# Patient Record
Sex: Male | Born: 1945 | Race: White | Hispanic: No | Marital: Married | Smoking: Former smoker
Health system: Southern US, Community
[De-identification: ages and names within clinical notes are randomized; demographics above are authoritative.]

## PROBLEM LIST (undated history)

## (undated) ENCOUNTER — Ambulatory Visit

## (undated) DIAGNOSIS — T4145XA Adverse effect of unspecified anesthetic, initial encounter: Secondary | ICD-10-CM

## (undated) DIAGNOSIS — K219 Gastro-esophageal reflux disease without esophagitis: Secondary | ICD-10-CM

## (undated) DIAGNOSIS — T8189XA Other complications of procedures, not elsewhere classified, initial encounter: Secondary | ICD-10-CM

## (undated) DIAGNOSIS — Z951 Presence of aortocoronary bypass graft: Secondary | ICD-10-CM

## (undated) DIAGNOSIS — K649 Unspecified hemorrhoids: Secondary | ICD-10-CM

## (undated) DIAGNOSIS — T8859XA Other complications of anesthesia, initial encounter: Secondary | ICD-10-CM

## (undated) DIAGNOSIS — M1990 Unspecified osteoarthritis, unspecified site: Secondary | ICD-10-CM

## (undated) DIAGNOSIS — I447 Left bundle-branch block, unspecified: Secondary | ICD-10-CM

## (undated) DIAGNOSIS — M549 Dorsalgia, unspecified: Secondary | ICD-10-CM

## (undated) DIAGNOSIS — I10 Essential (primary) hypertension: Secondary | ICD-10-CM

## (undated) DIAGNOSIS — R29898 Other symptoms and signs involving the musculoskeletal system: Secondary | ICD-10-CM

## (undated) DIAGNOSIS — I2510 Atherosclerotic heart disease of native coronary artery without angina pectoris: Secondary | ICD-10-CM

## (undated) DIAGNOSIS — R002 Palpitations: Secondary | ICD-10-CM

## (undated) DIAGNOSIS — B001 Herpesviral vesicular dermatitis: Secondary | ICD-10-CM

## (undated) DIAGNOSIS — K430 Incisional hernia with obstruction, without gangrene: Secondary | ICD-10-CM

## (undated) DIAGNOSIS — K439 Ventral hernia without obstruction or gangrene: Secondary | ICD-10-CM

## (undated) HISTORY — PX: EYE SURGERY: SHX253

## (undated) HISTORY — DX: Unspecified hemorrhoids: K64.9

## (undated) HISTORY — DX: Other complications of procedures, not elsewhere classified, initial encounter: T81.89XA

## (undated) HISTORY — PX: APPENDECTOMY: SHX54

## (undated) HISTORY — PX: COLONOSCOPY: SHX174

## (undated) HISTORY — DX: Atherosclerotic heart disease of native coronary artery without angina pectoris: I25.10

## (undated) HISTORY — PX: ELBOW SURGERY: SHX618

## (undated) HISTORY — PX: OTHER SURGICAL HISTORY: SHX169

## (undated) HISTORY — PX: HEMORROIDECTOMY: SUR656

## (undated) HISTORY — DX: Presence of aortocoronary bypass graft: Z95.1

---

## 2000-01-25 ENCOUNTER — Inpatient Hospital Stay (HOSPITAL_COMMUNITY): Admission: AD | Admit: 2000-01-25 | Discharge: 2000-01-30 | Payer: Self-pay | Admitting: Cardiovascular Disease

## 2000-01-25 ENCOUNTER — Encounter: Payer: Self-pay | Admitting: Thoracic Surgery (Cardiothoracic Vascular Surgery)

## 2000-01-26 ENCOUNTER — Encounter: Payer: Self-pay | Admitting: Thoracic Surgery (Cardiothoracic Vascular Surgery)

## 2000-01-26 DIAGNOSIS — Z951 Presence of aortocoronary bypass graft: Secondary | ICD-10-CM

## 2000-01-26 HISTORY — DX: Presence of aortocoronary bypass graft: Z95.1

## 2000-01-26 HISTORY — PX: CORONARY ARTERY BYPASS GRAFT: SHX141

## 2000-01-27 ENCOUNTER — Encounter: Payer: Self-pay | Admitting: Thoracic Surgery (Cardiothoracic Vascular Surgery)

## 2000-01-28 ENCOUNTER — Encounter: Payer: Self-pay | Admitting: Thoracic Surgery (Cardiothoracic Vascular Surgery)

## 2000-01-29 ENCOUNTER — Encounter: Payer: Self-pay | Admitting: Thoracic Surgery (Cardiothoracic Vascular Surgery)

## 2001-03-04 ENCOUNTER — Encounter: Payer: Self-pay | Admitting: *Deleted

## 2001-03-04 ENCOUNTER — Ambulatory Visit (HOSPITAL_COMMUNITY): Admission: RE | Admit: 2001-03-04 | Discharge: 2001-03-04 | Payer: Self-pay | Admitting: *Deleted

## 2001-06-01 ENCOUNTER — Encounter: Payer: Self-pay | Admitting: Cardiovascular Disease

## 2001-06-01 ENCOUNTER — Encounter: Payer: Self-pay | Admitting: Emergency Medicine

## 2001-06-01 ENCOUNTER — Inpatient Hospital Stay (HOSPITAL_COMMUNITY): Admission: EM | Admit: 2001-06-01 | Discharge: 2001-06-02 | Payer: Self-pay | Admitting: Emergency Medicine

## 2001-06-04 ENCOUNTER — Observation Stay (HOSPITAL_COMMUNITY): Admission: RE | Admit: 2001-06-04 | Discharge: 2001-06-05 | Payer: Self-pay | Admitting: General Surgery

## 2001-06-04 ENCOUNTER — Encounter: Payer: Self-pay | Admitting: Internal Medicine

## 2001-06-04 ENCOUNTER — Encounter: Payer: Self-pay | Admitting: General Surgery

## 2001-06-04 ENCOUNTER — Ambulatory Visit (HOSPITAL_COMMUNITY): Admission: RE | Admit: 2001-06-04 | Discharge: 2001-06-04 | Payer: Self-pay | Admitting: Internal Medicine

## 2002-01-07 ENCOUNTER — Encounter: Payer: Self-pay | Admitting: Internal Medicine

## 2002-01-07 ENCOUNTER — Ambulatory Visit (HOSPITAL_COMMUNITY): Admission: RE | Admit: 2002-01-07 | Discharge: 2002-01-07 | Payer: Self-pay | Admitting: Internal Medicine

## 2004-02-19 HISTORY — PX: CARDIAC CATHETERIZATION: SHX172

## 2004-03-10 ENCOUNTER — Observation Stay (HOSPITAL_COMMUNITY): Admission: EM | Admit: 2004-03-10 | Discharge: 2004-03-11 | Payer: Self-pay | Admitting: Emergency Medicine

## 2004-05-27 ENCOUNTER — Ambulatory Visit (HOSPITAL_COMMUNITY): Admission: RE | Admit: 2004-05-27 | Discharge: 2004-05-27 | Payer: Self-pay | Admitting: Family Medicine

## 2004-06-17 ENCOUNTER — Ambulatory Visit (HOSPITAL_COMMUNITY): Admission: RE | Admit: 2004-06-17 | Discharge: 2004-06-17 | Payer: Self-pay | Admitting: Otolaryngology

## 2004-10-06 ENCOUNTER — Ambulatory Visit (HOSPITAL_COMMUNITY): Admission: RE | Admit: 2004-10-06 | Discharge: 2004-10-06 | Payer: Self-pay | Admitting: Family Medicine

## 2005-05-11 ENCOUNTER — Emergency Department (HOSPITAL_COMMUNITY): Admission: EM | Admit: 2005-05-11 | Discharge: 2005-05-11 | Payer: Self-pay | Admitting: Emergency Medicine

## 2005-06-27 ENCOUNTER — Ambulatory Visit (HOSPITAL_COMMUNITY): Admission: RE | Admit: 2005-06-27 | Discharge: 2005-06-27 | Payer: Self-pay | Admitting: Family Medicine

## 2009-12-29 ENCOUNTER — Ambulatory Visit (HOSPITAL_COMMUNITY): Admission: RE | Admit: 2009-12-29 | Discharge: 2009-12-29 | Payer: Self-pay | Admitting: Family Medicine

## 2009-12-29 IMAGING — CR DG CHEST 2V
2 series · 2 of 2 positions shown · non-contrast
Comparison: [DATE]

CLINICAL DATA: Cough, fever, chest pain, shortness of breath

CHEST - 2 VIEW

[view not recorded (1 of 2)]
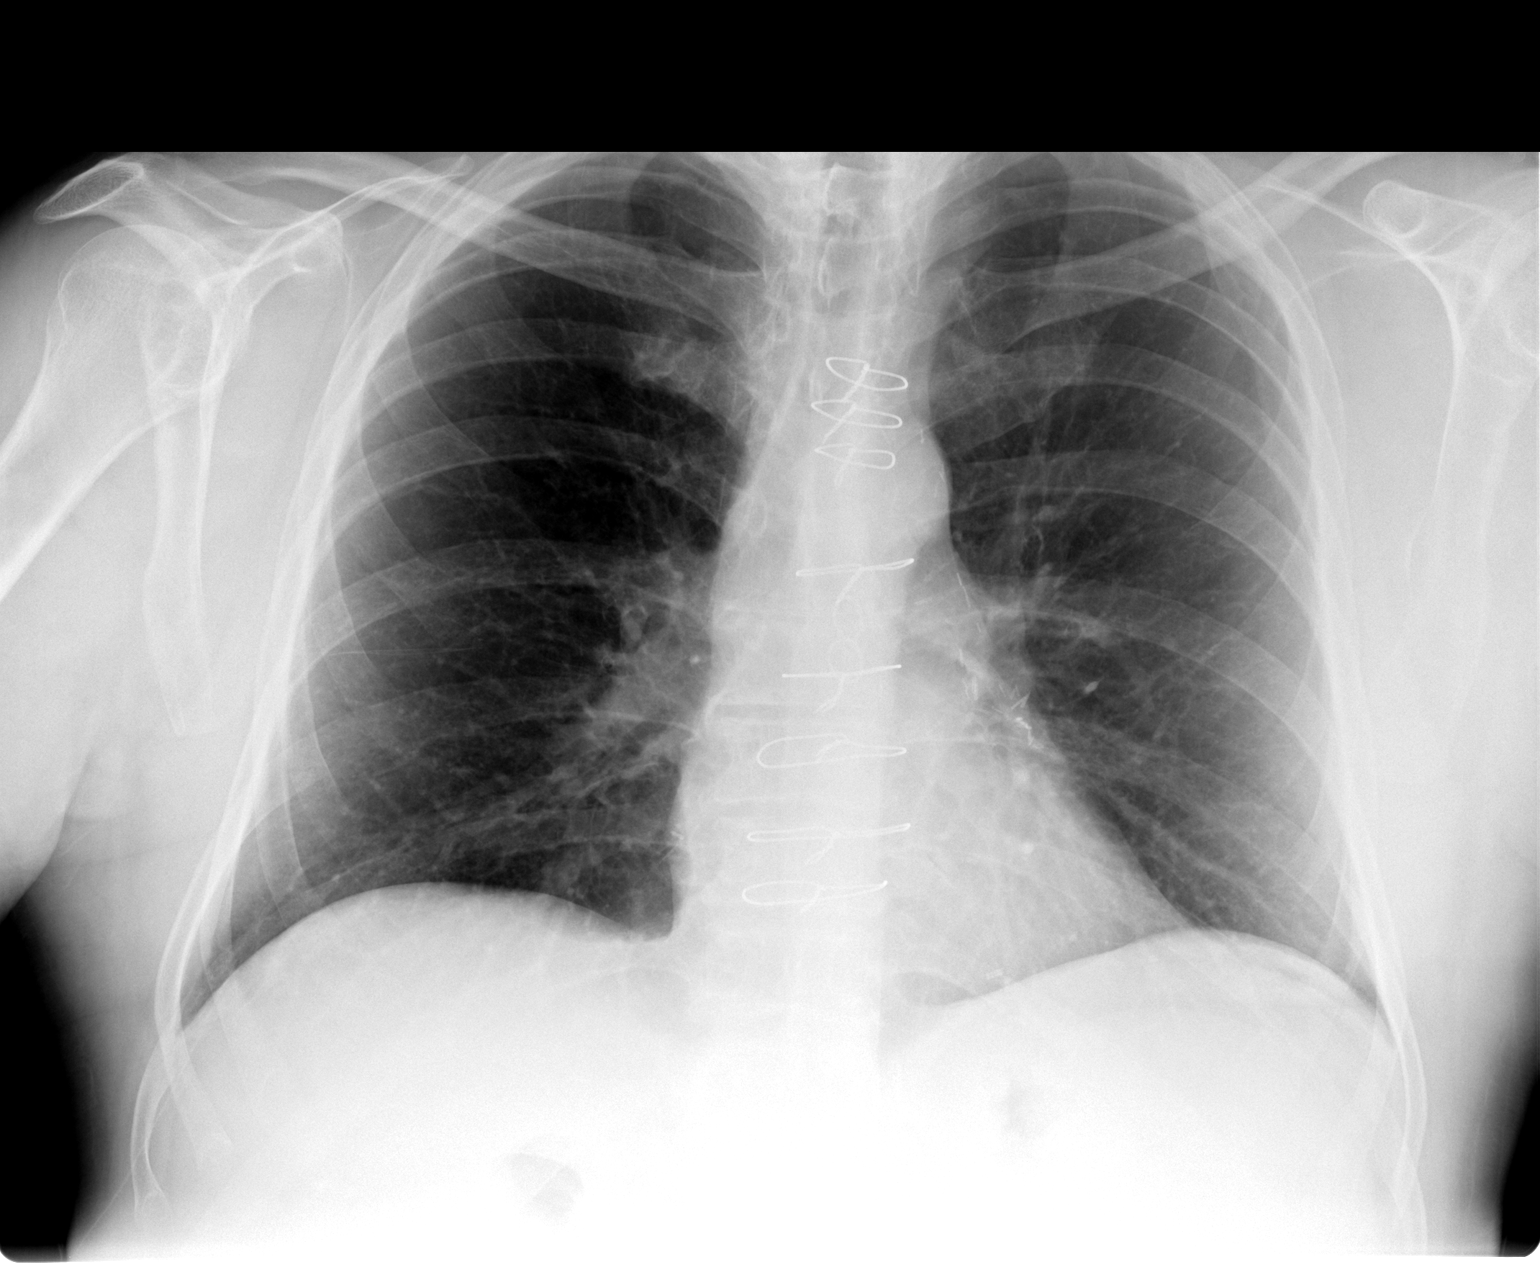

[view not recorded (2 of 2)]
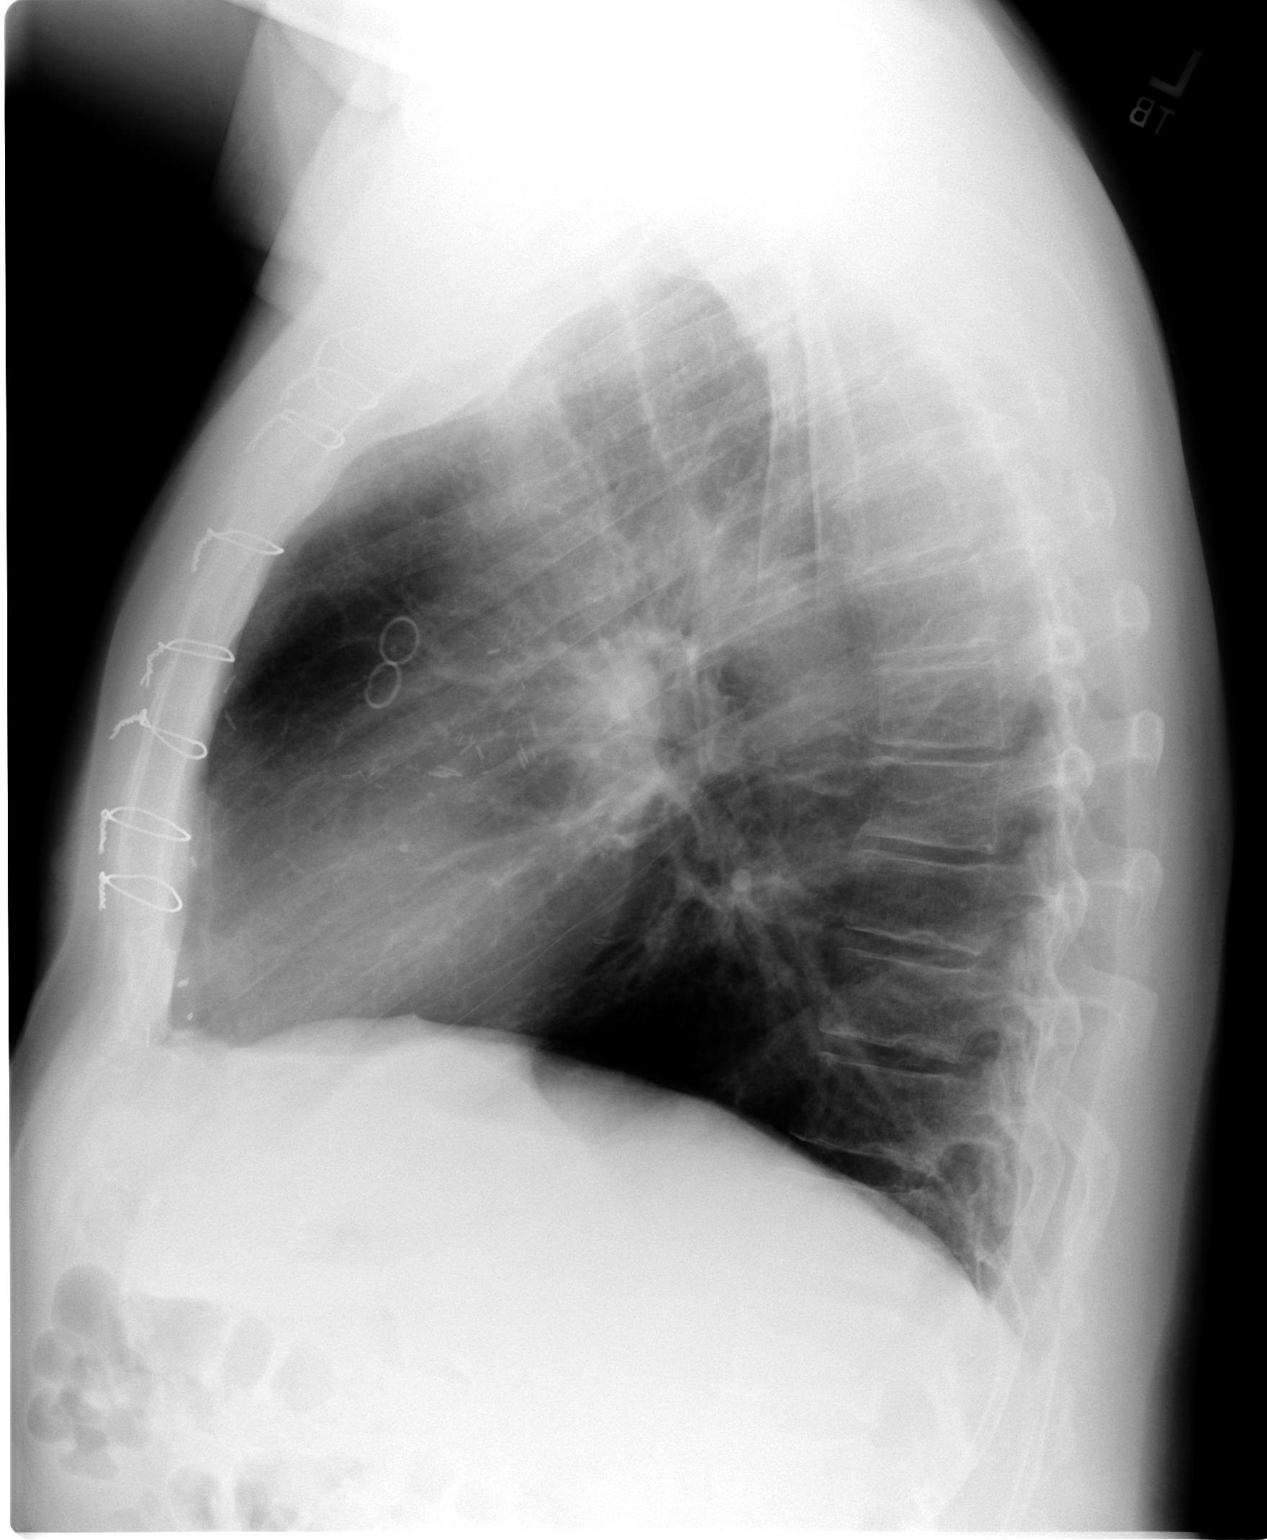

[2 of 2 positions shown; findings below may reference images not displayed]

FINDINGS: Normal heart size, mediastinal contours, and pulmonary vascularity.
Postsurgical changes of CABG.
Minimal chronic bronchitic changes without infiltrate or effusion.
No acute bony abnormality or pneumothorax.
IMPRESSION: Status post CABG.
Minimal chronic bronchitic changes without acute infiltrate.

## 2011-02-06 ENCOUNTER — Other Ambulatory Visit: Payer: Self-pay | Admitting: General Surgery

## 2011-02-06 ENCOUNTER — Encounter (HOSPITAL_COMMUNITY): Payer: 59

## 2011-02-06 LAB — SURGICAL PCR SCREEN: MRSA, PCR: NEGATIVE

## 2011-02-06 LAB — CBC
HCT: 42.1 % (ref 39.0–52.0)
Hemoglobin: 15.1 g/dL (ref 13.0–17.0)
MCH: 31.5 pg (ref 26.0–34.0)
MCV: 87.9 fL (ref 78.0–100.0)
RBC: 4.79 MIL/uL (ref 4.22–5.81)
RDW: 12.7 % (ref 11.5–15.5)
WBC: 9.8 10*3/uL (ref 4.0–10.5)

## 2011-02-06 LAB — BASIC METABOLIC PANEL
BUN: 13 mg/dL (ref 6–23)
CO2: 30 mEq/L (ref 19–32)
Creatinine, Ser: 0.79 mg/dL (ref 0.4–1.5)
GFR calc non Af Amer: >60 mL/min (ref >60)

## 2011-02-10 ENCOUNTER — Other Ambulatory Visit: Payer: Self-pay | Admitting: General Surgery

## 2011-02-10 ENCOUNTER — Observation Stay (HOSPITAL_COMMUNITY)
Admission: RE | Admit: 2011-02-10 | Discharge: 2011-02-11 | Disposition: A | Discharge status: Home / Self Care | Payer: 59 | Source: Ambulatory Visit | Attending: General Surgery | Admitting: General Surgery

## 2011-02-10 DIAGNOSIS — Z01812 Encounter for preprocedural laboratory examination: Secondary | ICD-10-CM | POA: Insufficient documentation

## 2011-02-10 DIAGNOSIS — K4090 Unilateral inguinal hernia, without obstruction or gangrene, not specified as recurrent: Principal | ICD-10-CM | POA: Insufficient documentation

## 2011-02-10 DIAGNOSIS — K644 Residual hemorrhoidal skin tags: Secondary | ICD-10-CM | POA: Insufficient documentation

## 2011-02-13 NOTE — Discharge Summary (Signed)
  Tyler Lawrence, Tyler Lawrence               ACCOUNT NO.:  [id number]  MEDICAL RECORD NO.:  [id number]           PATIENT TYPE:  O  LOCATION:  A317                          FACILITY:  [facility]  PHYSICIAN:  Barbaraann Barthel, M.D. DATE OF BIRTH:  09/19/1946  DATE OF ADMISSION:  02/10/2011 DATE OF DISCHARGE:  03/24/2012LH                              DISCHARGE SUMMARY   DIAGNOSES: 1. Right inguinal hernia. 2. Large hemorrhoidal tags  PROCEDURE: 1. On February 10, 2011, via the outpatient department, right inguinal     herniorrhaphy and (modified McVay repair without mesh). 2. Excision of large hemorrhoidal tags x2.  SECONDARY DIAGNOSIS:  Coronary artery disease, history of coronary artery bypass in 2001.  Note, this is a 65 year old white male who presented with large hemorrhoidal tags.  There were sequelae to hemorrhoidectomy in 1994.  I also found that he had a right inguinal hernia and we planned to take care of both of these problems during the same admission.  Due to his significant cardiac history, he had a cardiac clearance with [facility] preoperatively and we planned to see him via the outpatient department and planned for surgery with a period of observation afterwards.  This was taken care of on February 10, 2011, at which time the herniorrhaphy was carried out without problem and I excised the 2 large hemorrhoidal tags and he was discharged on the following day.  At the time of discharge, he was voiding well without dysuria.  His wounds were clean and they were redressed, and he had no shortness of breath or leg pain or chest pain.  DISCHARGE INSTRUCTIONS:  He is discharged on a regular diet.  He is told to increase his activity as tolerated.  He is told to shower.  He is to do no lifting greater than 5 pounds.  No driving and no sexual activity until instructed.  He is told to do sitz baths after each BM and apply Vaseline gauze to the perianal area.  He is also told  given a prescription for Colace 100 mg p.o. daily or every other day as needed for his bowels and he is also given a prescription for Toradol 10 mg p.o. q.4-6 h as needed for pain.  We will follow up with him on Friday on February 17, 2011, at 10:00 a.m.  He is told to contact us should there be any acute problems or go to the emergency room should there be any problems.     Barbaraann Barthel, M.D.     WB/MEDQ  D:  02/11/2011  T:  02/11/2011  Job:  [id number]  cc:   Madelin Rear. Sherwood Gambler, MD Fax: [phone]  Electronically Signed by Barbaraann Barthel M.D. on 02/13/2011 11:59:28 AM

## 2011-02-13 NOTE — Op Note (Signed)
Tyler Lawrence, Tyler Lawrence               ACCOUNT NO.:  [id number]  MEDICAL RECORD NO.:  [id number]           PATIENT TYPE:  O  LOCATION:  A317                          FACILITY:  [facility]  PHYSICIAN:  Barbaraann Barthel, M.D. DATE OF BIRTH:  Oct 15, 1946  DATE OF PROCEDURE:  02/10/2011 DATE OF DISCHARGE:                              OPERATIVE REPORT   SURGEON:  Barbaraann Barthel, MD.  PREOPERATIVE DIAGNOSES: 1. Right inguinal hernia. 2. Large external hemorrhoidal tags.  PROCEDURE: 1. Right inguinal herniorrhaphy (modified McVay repair without mesh). 2. Excision of large external hemorrhoidal tags x2.  WOUND CLASSIFICATIONS:  For hernia, clean.  For hemorrhoidal tags, clean contaminated.  SPECIMEN:  Hemorrhoidal tags x2.  Note, this is a 65 year old white male who presented with large external hemorrhoidal tags and difficulty cleaning himself.  He had undergone a hemorrhoidectomy in 1994 and these tags were causing him considerableconsternation.  We discussed excising these with him, and on physical examination I noted that he also had a right inguinal hernia.  We decided to take care of this as an outpatient and discussed complications not limited to but including bleeding and infection and as far as the hernia goes for possible recurrence.  Informed consent was obtained.  GROSS OPERATIVE FINDINGS:  The patient had a direct inguinal hernia noted.  No indirect component was noted there.  Regarding the hemorrhoids, the patient had large hemorrhoidal tags x2.  These were located at approximately the 5 o'clock position and the 11 o'clock position with the 12 o'clock position being anterior.  TECHNIQUE:  The patient was placed in the supine position after attempted spinal anesthesia.  This was converted to endotracheal intubation.  There was some difficulty by the nursing staff, putting in the Foley.  I asked Dr. Jerre Simon and he confirmed that the Foley was in the bladder and there was no  problem.  We then prepped the abdomen and draped in usual manner.  An incision was carried out between the anterior-superior iliac spine and the pubic tubercle through skin, subcutaneous tissue, and Scarpa's layer down to the external oblique, which was opened through the external ring.  The ilioinguinal nerve was preserved from the area of dissection and we then checked for an indirect component.  There was no indirect hernia sac.  The directed defect was obviously visible.  This was about the size of a thumb.  We then sutured transversus abdominis and transversalis fascia to Cooper's ligament and Poupart's ligament with interrupted 2-0 Bralon sutures.  A relaxing incision was carried out before cinching these tightly.  We used 0.5% Sensorcaine around this wound here  for postoperative comfort.  I then returned the cord structures to their anatomic position and repaired the external oblique with a running 3-0 Polysorb over it.  The subcu was irrigated and the skin was approximated with stapling device.  Prior to closure, all sponge, needle, and instrument counts were found to be correct.  There were no complications.  I then dressed the wound with 4x4s and an OpSite dressing and attention was then turned to the hemorrhoidal tags.  The legs were then elevated and he was  in a lithotomy position.  We then prepped with Betadine solution and draped in usual manner and I used the LigaSure and removed the hemorrhoidal tags from the 5 o'clock and the 11 o'clock position uneventfully.  I used approximately 15 mL of 0.5% Sensorcaine to help with postoperative comfort perianally and a viscous dressing was applied.  Prior to closure, all sponge, needle, and instrument counts were found be correct.  Estimated blood loss was minimal.  The patient received 1800 mL crystalloids intraoperatively. The patient was taken to recovery room in satisfactory condition. Because of his cardiac history and the  problems with administering a spinal anesthesia, we wanted to keep him under observation.  He was taken to recovery room in satisfactory condition.     Barbaraann Barthel, M.D.     WB/MEDQ  D:  02/10/2011  T:  02/10/2011  Job:  [id number]  cc:   Madelin Rear. Sherwood Gambler, MD Fax: [phone]  Electronically Signed by Barbaraann Barthel M.D. on 02/13/2011 11:58:26 AM

## 2011-04-07 NOTE — Cardiovascular Report (Signed)
NAME:  Tyler Lawrence, Tyler Lawrence                         ACCOUNT NO.:  [id number]   MEDICAL RECORD NO.:  [id number]                   PATIENT TYPE:  INP   LOCATION:  3709                                 FACILITY:  [facility]   PHYSICIAN:  Thereasa Solo. Little, M.D.              DATE OF BIRTH:  1946/06/03   DATE OF PROCEDURE:  03/11/2004  DATE OF DISCHARGE:                              CARDIAC CATHETERIZATION   PROCEDURES PERFORMED:   CARDIOLOGIST:  Gaspar Garbe B. Little, M.D.   INDICATIONS FOR TEST:  This 65 year old male had bypass surgery in 2001.  He  had an episode chest discomfort yesterday and was transferred to [facility] for further evaluation.  His cardiac enzymes were negative and the  was brought to the cath lab for repeat cardiac catheterization with graft  visualization.   DESCRIPTION OF PROCEDURE:  The patient was prepped and draped in the usual  sterile fashion exposing the right groin.  Following local anesthetic with  1% Xylocaine the Seldinger technique was employed and a 5 [place] introducer  sheath was placed into the right femoral artery.  Left and right coronary  arteriography, graft visualization and ventriculography in the RAO  projection were performed.   COMPLICATIONS:  None.   CONTRAST MATERIAL:  Total contrast; 115 mL.   EQUIPMENT:  Five French Judkins Configuration Catheters.  The saphenous vein  graft tot the diagonal had to be cannulated with a left coronary bypass  catheter, but the left internal mammary artery was well visualized with an  RCA catheter.   RESULTS:   HEMODYNAMIC MONITORING:  Central aortic pressure was 112/68.  The left  ventricular pressure was 112/5.  There was no valve gradient noted at the  time of pullback.   VENTRICULOGRAPHIC DATA:  Ventriculography in the RAO projection using 25 mL  of contrast at 12 cc/second revealed normal systolic function with no wall  motion abnormalities.  There was a PVC during the middle of the  ventriculogram  and there was ectopy-induced mitral regurgitation, but there  was no mitral regurgitation in the beats that were not associated with the  PVC.  Ejection fraction was greater than 55%.  The end-diastolic pressure  was 6.   CORONARY ARTERIOGRAPHIC DATA:  Coronary arteriography showed calcification  in the proximal portion of the LAD with single fluoroscopy.   ANGIOGRAPHIC DATA:  1. Left Main:  The left main bifurcated and was normal.   1. LAD:  The LAD was totally occluded in its proximal portion after a small     septal perforator.  The proximal portion prior to the total occlusion was     diffusely diseased.  There was no evidence of any collateral flow to the     distal LAD through any of the vessels.   1. Circumflex:  The circumflex was a large dominant vessel.  It gave rise to     a PDA and three OM  vessels.  There appears to be a very proximal OM     vessel that is faintly visualized, but is filled by the saphenous vein     graft.   1. Right Coronary Artery:  The right coronary artery is a nondominant vessel     that has a hyperlucent proximal area of 70% narrowing and this is similar     to the diffuse disease that was noted in 2001.   GRAFTS:  1. Saphenous Vein Graft to the OM:  The graft was widely patent.  There was     minimal narrowing at the distal anastomotic site and probably represents     a discrepancy between the size of the graft and the size of the vessel,     but there was no reduction in flow and there was no significant     narrowing.  The OM itself was well-visualized and there was some reflux     of contrast medium into the proximal circumflex.   1. Left Internal Mammary Artery to the LAD:  The left internal mammary     artery is widely patent.  It inserts into the mid/distal portion of the     LAD.  The LAD itself crosses the apex of the heart and is widely patent.     There is some reflux of the contrast antegrade into the first diagonal.   1. Saphenous  Vein Graft to the Diagonal:  This graft is smaller in diameter     than the other saphenous vein graft and there are some minimal     irregularities. The diagonal that it supplies is also extremely small,     but is free of disease.   CONCLUSION:  1. Normal left ventricular systolic function.  2. Widely patent grafts to the left anterior descending, first diagonal and     obtuse marginal vessel.  There is a nondominant right coronary artery     that has significant disease in it, but this vessel is too small and does     not warrant any type of interventions because it is nondominant.   At this point I do not think the patient needs any additional intervention  and needs to continue medical therapy.  The should be ready for discharge  later today.                                               Thereasa Solo. Little, M.D.    ABL/MEDQ  D:  03/11/2004  T:  03/12/2004  Job:  [id number]   cc:   Nicki Guadalajara, M.D.  [address], [address]  [place], [place] [address]  Fax: [phone]   Madelin Rear. Sherwood Gambler, M.D.  [address]  [place]  [place] [address]  Fax: [phone]   Catheterization Laboratory

## 2011-04-07 NOTE — Discharge Summary (Signed)
Tyler Lawrence. [facility]  Patient:    Lawrence, Tyler                      MRN: [id number] Adm. Date:  [id number] Disc. Date: [id number] Attending:  Tressie Stalker Dictator:   Lissa Merlin, P.A.-C CC:         [name]. Cornelius Moras, M.D.             [name], M.D.             Artis Delay, M.D.                           Discharge Summary  DATE OF BIRTH:  October 12, 1946  ADMISSION DIAGNOSES:  1. Unstable angina (new onset).  2. Abnormal stress Cardiolite.  PAST MEDICAL HISTORY:  1. Abnormal stress Cardiolite on March 2, 01.  2. Increasing dyspnea on exertion.  3. Palpitations.  4. Positive family history of CAD.  5. Heavy tobacco abuse, 3 packs per day since high school.  6. No previous documented history of CAD.  DISCHARGE DIAGNOSES:  1. Severe 2-vessel coronary artery disease as per catheterization.  2. Status post coronary artery bypass grafting.  PROCEDURES:  1. Cardiac catheterization on January 25, 2000.  Indicated severe 2-vessel CAD     including a critical 95% stenosis of the left anterior descending coronary     artery located at the takeoff of the first diagonal branch.  This was felt ot     to be amenable to percutaneous-based intervention.  There was a 50-60% proximal     stenosis at the first circumflex marginal branch and a small nondominant right     coronary artery.  There was normal left ventricular function.  2. Coronary artery bypass grafting x 3 on January 26, 2000 with the following grafts:     LIMA to LAD, saphenous vein graft to first diagonal, and saphenous vein graft     to the first circumflex marginal branch.  BRIEF HISTORY:  The patient is a 65 year old, who has been followed by Dr. Artis Delay and referred by Dr. Daphene Jaeger for management of coronary artery disease. He has a history of hypertension and previous history of tobacco abuse.  He presented with new onset angina and underwent stress Cardiolite examination by Dr.  Tresa Endo,  which showed classical ischemic changes, both with symptoms, EKG changes as well. The patient was admitted for elective cardiac catheterization by Dr. Tresa Endo. The findings indicated above.  CVTS was consulted.  Dr. Cornelius Moras evaluated Mr. Crossan and the available data.  He concluded CABG was the best treatment for him.  He discussed the indications and potential benefits of coronary artery bypass grafting.  He discussed the risks.  The patient and his family were agreeable to proceed with surgery.  The patient underwent the operation on January 26, 2000 without complications.  He was in normal sinus rhythm.  There were no inotropic agents, nor blood products given. He was transported to SICU in stable condition.  He was extubated later that evening.  He was awake and hemodynamically stable.  He had good saturation. His lab work was satisfactory.  His lungs were clear.  He was noted to be doing very well.  On postop day #1 he was afebrile, in normal sinus rhythm.  He was hemodynamically stable.  O2 saturations were good.  His abdomen was soft and nontender, nondistended.  His  urinary output was good.  Hemoglobin was 11.8, platelets were 169.  Chest x-ray was satisfactory.  Cardiogram showed normal sinus rhythm.  He was noted to be doing very well and was deemed suitable to transfer to unit 2000. e was being followed by cardiology, who also noted his good progress.  He was kept in SICU secondary to no bed available on 2000 on January 27, 2000.  He continued to do well.  Chest tubes were discontinued without incident.  He was eventually transferred to unit 2000 when a bed became available.  On January 30, 2000, postop day #4, he was awake, alert, and pleasant.  He had no  complaints except mild soreness around his incisions.  He had had a bowel movement. His vital signs were stable.  He was afebrile.  He was 92% on room air.  He was  back to his preoperative weight.  Physical  exam revealed clear lungs, benign abdomen, regular rate and rhythm, S1 and S2.  His incisions were healing very well. There was no erythema noted.  He was deemed suitable to discharge home and was subsequently done so that day.  MEDICATIONS:  1. Lopressor 50 mg 1/2 tablet q.12h.  2. Enteric-coated aspirin 325 mg 1 p.o. q.d.  3. Tylox 1-2 p.o. q.4-6h. p.r.n. pain.  SPECIAL INSTRUCTIONS:  The patient was instructed to avoid strenuous activity. No heavy lifting over 10 pounds.  He was told to avoid driving, and to walk daily. He was told that he can shower.  He was told to use his incentive spirometer daily. He was told to maintain a low fat, low cholesterol, Heart Smart diet.  He was told to get a chest x-ray when he sees Dr. Tresa Endo in followup, and to bring this with him to see Dr. Cornelius Moras.  FOLLOWUP:  1. The patient is to see Dr. Tresa Endo, March 22, at 1:30 p.m.  2. The patient was told that the CVTS office would call him with the day and time     of his appointment with Dr. Cornelius Moras. DD:  02/22/00 TD:  02/25/00 Job: [id number]

## 2011-04-07 NOTE — Discharge Summary (Signed)
NAME:  Tyler Lawrence, Tyler Lawrence                         ACCOUNT NO.:  [id number]   MEDICAL RECORD NO.:  [id number]                   PATIENT TYPE:  INP   LOCATION:  3709                                 FACILITY:  [facility]   PHYSICIAN:  Nicki Guadalajara, M.D.                  DATE OF BIRTH:  [birth year]   DATE OF ADMISSION:  03/10/2004  DATE OF DISCHARGE:  03/11/2004                                 DISCHARGE SUMMARY   DISCHARGE DIAGNOSES:  1. Chest pain consistent with unstable angina, patent grafts at     catheterization this admission.  2. History of coronary artery bypass grafting in the past by Dr. Cornelius Moras with     an left internal mammary artery to left anterior descending, saphenous     vein graft to the circumflex, and saphenous vein graft to the diagonal.  3. Treated hyperlipidemia.  4. History of palpitations.  5. Treated hypertension.   HOSPITAL COURSE:  The patient is a 65 year old male followed by Dr. Tresa Endo  who has had bypass surgery in 2001.  He had a Cardiolite study last April  that showed no ischemia with good LV function.  He presented March 10, 2004  with chest tenderness and some palpitations.  He was admitted to telemetry  and started on IV heparin.  Diagnostic catheterization was done March 11, 2004 by Dr. Clarene Duke.  This revealed a patent SVG to the OM1, patent LIMA to  the LAD, patent SVG to the diagonal with normal LV function.  We feel he can  be discharged later today.  His medications were not changed.   DISCHARGE MEDICATIONS:  1. Niaspan 1 g h.s.  2. Toprol XL 50 mg a day.  3. Zocor 20 mg a day.  4. Coated aspirin once a day.  5. Altace 5 mg a day.  6. Nexium 40 mg a day.   LABORATORY DATA:  White count 6.2, hemoglobin 13.3, hematocrit 38.9,  platelets 192.  Sodium 139, potassium 3.7, BUN 12, creatinine 0.9.  LFT's  are normal.  INR is 0.9.  CK, MB, and troponins were negative x2.  TSH is  0.715.  Urinalysis is unremarkable.   EKG shows sinus rhythm without acute  changes.   DISPOSITION:  The patient is discharged in stable condition.  He will follow  up with Dr. Tresa Endo on May 10 at 10:30.      Abelino Derrick, P.A.                      Nicki Guadalajara, M.D.    Lenard Lance  D:  03/11/2004  T:  03/12/2004  Job:  [id number]   cc:   Madelin Rear. Sherwood Gambler, M.D.  [address]  [place]  [place] [address]  Fax: [phone]

## 2011-04-07 NOTE — Cardiovascular Report (Signed)
[facility]  Patient:    Tyler Lawrence, Tyler Lawrence                      MRN: [id number] Proc. Date: 01/25/00 Adm. Date:  [id number] Attending:  Tressie Stalker CC:         [name], M.D.             Orville Govern -- CVTS Office             Bevelyn Buckles, M.D.                        Cardiac Catheterization  INDICATIONS:  Mr. Aber is a 65 year old white male with a longstanding history of sensation of chest fluttering and palpitations.  He has a significant tobacco abuse history and at some point having smoked up to three packs per day remotely.  He  recently was seen for palpitations and chest pain.  A Cardiolite scan was abnormal, with exercise-induced ST segment depression associated with chest tightness inferolaterally, and cineangiographic evidence for ischemia involving the inferior wall from the distal and mid ventricle into the apex.  He had preserved LV function at 60%.  He also had ischemia inferoseptally.  He is now admitted for cardiac catheterization.  HEMODYNAMIC DATA: 1. Central aortic pressure:  104/57. 2. Left ventricular pressure:  104/13.  ANGIOGRAPHIC DATA: 1. LEFT MAIN CORONARY ARTERY:  Angiographically normal.  It bifurcated into the LAD    and left circumflex system. 2. LEFT ANTERIOR DESCENDING:  Gave rise to a very proximal eccentric 70-80%    stenosis, followed by diffuse narrowing of approximately 50% until this became    95% stenosed in the region of the first diagonal takeoff.  There was bifurcation    stenosis, with 99% stenosis involving the ostium of this first diagonal vessel.    The remainder of the LAD appeared free of significant disease. 3. LEFT CIRCUMFLEX:  A dominant vessel.  The large first obtuse marginal vessel    seemed to have a 40-50% right proximal stenosis.  The remainder of the    circumflex system was angiographically normal and gave rise to additional obtuse    marginal vessel, a PLA and  PDA system. 4. RIGHT CORONARY ARTERY:  Diffusely diseased, but was a nondominant vessel with    narrowing of 40-50% in the proximal to mid segment, and 30% in the mid distal    segment.  LEFT VENTRICULOGRAPHY:  Biplane left cineangiography revealed preserved global V contractility with subtle anterolateral hypocontractility.  The left subclavian and internal mammary artery were normal and suitable for CABG revascularization.  DISTAL AORTOGRAPHY:  Did not demonstrate any significant aortoiliac disease. There is no evidence of renal artery stenosis.  IMPRESSION: 1. Preserved global LV function with mild focal anterolateral hypocontractility. 2. Significant multivessel disease with diffuse proximal LAD stenoses, with    70-80% eccentric near-ostial stenosis, followed by diffuse disease of 50% and    95% bifurcation stenosis involving the takeoff of the first diagonal vessel nd    LAD.  There is 40-50% stenosis in the obtuse marginal branch of a dominant    left circumflex coronary artery.  Nondominant right coronary artery with diffuse    40-50% irregularity proximally, 30% irregularity distally.   PLAN:  Cineangiograms will be reviewed with colleagues.  The patient does have diffuse LAD disease extending almost to the ostium and involving the takeoff the of first diagonal vessel.  I suspect CABG revascularization surgery with sequential LIMA to diagonal of the LAD may provide the best long-term benefit.  Surgical consultation will be obtained. DD:  01/25/00 TD:  01/26/00 Job: [id number]

## 2011-04-07 NOTE — Discharge Summary (Signed)
[place]. [facility]  Patient:    Tyler Lawrence, Tyler Lawrence Visit Number: [id number] MRN: [id number]          Service Type: MED Location: 2000 2008 01 Attending Physician:  Berry, Jonathan [place] Dictated by:   Darcella Gasman. In[place], F.N.P.C. Adm. Date:  06/01/2001 Disc. Date: 06/02/2001   CC:         Artis Delay, M.D., [facility],  [place], [place]   Discharge Summary  DISCHARGE DIAGNOSES: 1. Chest pain, nonspecific, probable gastrointestinal.    a. Negative myocardial infarction. 2. Abdominal discomfort, resolved. 3. Near syncope, most likely vasovagal, resolved.  DISCHARGE CONDITION:  Improved.  PROCEDURES:  None.  DISCHARGE MEDICATIONS: 1. Toprol 75 mg daily. 2. Enteric-coated aspirin 325 mg daily. 3. Niaspan 1000 mg daily. 4. Zocor 20 mg one every evening. 5. Nitroglycerin 1/150 as needed for chest pain. 6. Dyazide as before.  DISCHARGE INSTRUCTIONS: 1. Activity as tolerated. 2. Low-fat, low-salt diet. 3. Call the office Monday for an appointment with [name], M.D., in    three weeks.  HISTORY OF PRESENT ILLNESS:  This 65 year old white male with a history of coronary artery disease and CABG x 3 in March of 2001 presented to the emergency room with complaints of shortness of breath and chest pressure on June 01, 2001.  He was seen by [name], M.D., in the office in March of 2002 and was stable at that time.  The patient had reported being in good condition.  He works a lot and was physically active.  He woke up the night prior to admission to go to the bathroom and during urination felt like he was going to pass out.  His heart rate increased and he became nauseated and diaphoretic and had chest discomfort with some pressure-type feeling.  He then developed diarrhea x 2 with abdominal cramping.  No nitroglycerin was taken.  Chest pain and shortness of breath returned and he decided to go to the emergency room.  PAST  MEDICAL HISTORY:  Coronary artery bypass grafting by Tyler Lawrence. Cornelius Moras, M.D., in March of 2001 with LIMA to the LAD, saphenous vein graft to the left circumflex, and saphenous vein graft to the diagonal.  He also has hyperlipidemia, tachypalpitations, hypertension, and tobacco use in the past.  FAMILY HISTORY:  See H&P.  SOCIAL HISTORY:  See H&P.  REVIEW OF SYSTEMS:  See H&P.  OUTPATIENT MEDICATIONS: 1. Toprol XL 75 mg daily. 2. Dyazide 37.5/25 mg daily. 3. Niaspan 1000 mg daily. 4. Zocor 20 mg at h.s. 5. Ecotrin 325 mg daily.  ALLERGIES:  TYLOX and E-MYCIN.  PHYSICAL EXAMINATION AT DISCHARGE:  Blood pressure 120/70, pulse 80, respirations 20, temperature 97.7 degrees, room air oxygen saturation 95%.  GENERAL APPEARANCE:  An alert and oriented in no acute distress.  HEART:  S1 and S2 regular rate and rhythm.  LUNGS:  Clear without rhonchi, rales, or wheezes.  ABDOMEN:  Soft and nontender.  Positive bowel sounds.  LABORATORY DATA:  The hemoglobin was 15.6, hematocrit 44.4, WBC 6.8, MCV 90, platelets 167, neutrophils 67, lymphs 27, monos 5, eos 2, and basos 0.  This remained stable.  Pro time 13.1, INR 1, PTT 26.  Sodium 140, potassium 4.0, chloride 106, CO2 29, glucose 153, BUN 10, creatinine 0.8, calcium 8.4.  This remained stable, except the glucose did come down to 120.  Cardiac enzymes: CK ranged 176, 136, and 128, MB 3.1, 1.9, and 1.3, and troponin 0.03 to 0.01.  The patient  had two chest x-rays with no active disease post coronary artery bypass grafting.  EKG on admission:  Sinus rhythm with voltage, borderline EKG, no significant change from previous tracing.  HOSPITAL COURSE:  Mr. Guidotti was admitted by Runell Gess, M.D., on June 01, 2001, for chest pain and near syncope.  Cardiac enzymes were done, which were all negative.  He was not placed on heparin or nitroglycerin secondary to felt to be GI initially.  Actually he was supposed to have gone to  observation not an admission.  The blood work was stable.  By the morning of June 02, 2001, the patient felt much better with no other complaints. Enzymes were all negative for myocardial infarction.  DISPOSITION:  He was discharged home by Runell Gess, M.D.  FOLLOW-UP:  He will follow up in the office with [name], M.D. Dictated by:   Darcella Gasman Ingold, F.N.P.C. Attending Physician:  Berry, Jonathan [place]azil[place] DD:  07/09/01 TD:  07/10/01 Job: [id number]

## 2011-04-07 NOTE — Discharge Summary (Signed)
[place]. [facility]  Patient:    Tyler Lawrence, Tyler Lawrence                      MRN: [id number] Adm. Date:  [id number] Attending:  Tressie Stalker Dictator:   Eugenia Pancoast, P.A. CC:         [name], M.D.             [name]. Cornelius Moras, M.D.                           Discharge Summary  DATE OF BIRTH:  09-Jul-1946  FINAL DIAGNOSES: 1. Coronary artery disease. 2. Hypertension.  PROCEDURES: 1. On January 25, 2000, left heart catheterization by Dr. [name]. 2. On January 26, 2000, coronary artery bypass grafting x 3 with the LIMA to the    LAD, saphenous vein graft to the circumflex, and a saphenous vein graft to    the diagonal artery.  SURGEON:  [name]. Cornelius Moras, M.D.  HISTORY:  This is a 65 year old gentleman with no prior history of coronary artery disease, but he does have a long-standing history of sensation of chest fluttering and palpitations.  The patient has a significant history for tobacco abuse, and a history at some point of having smoked up to three packs per day.  The patient recently was seen for palpitations and chest pain.  A Cardiolite scan was done, which was abnormal with exercised induced ST segment depression associated with chest tightness.  Because of this, he was scheduled to undergo a cardiac catheterization per Dr. [name].  He was referred to Dr. Tresa Endo by Dr. ______ .  HOSPITAL COURSE:  The patient was admitted to the [facility], and on January 25, 2000, the day that he was admitted, he underwent a cardiac catheterization which revealed significant two-vessel disease.  Because of this, he was referred to Dr. Cornelius Moras for surgical revascularization.  Dr. Cornelius Moras saw the patient and discussed the surgery.  The risks and benefits were explained.  The patient understood and agreed to surgery.  On January 26, 2000, the patient underwent coronary artery bypass surgery x 3 with the LIMA to the LAD, a saphenous vein  graft to the circumflex artery, and a saphenous vein graft to the diagonal artery.  The patient tolerated the procedure well.  No intraoperative complications occurred.  Postoperatively, the patient was extubated on the operative day.  The patient was doing quite well after that. He remained afebrile.  His vital signs were stable.  He was transferred to the step-down unit on the first postoperative day.  There, he continued to progress in a satisfactory manner.  His final lab work showed that his white blood cells were 10.9, hemoglobin was 11.1, hematocrit 32.1, and platelets were 164.  Sodium of 139, potassium of 3.8, chloride was 100, CO2 was 30, BUN 10, creatinine 0.9, glucose 117, and calcium was 8.2.  The patient was doing quite well.  No untoward events occurred during his stay.  He progressed in a very satisfactory manner.  He was subsequently prepared for discharge on January 29, 2000.  The patient did have a complaint of constipation, but he was given Dulcolax suppository with good results.  The patient was subsequently prepared for discharge as noted.  DISCHARGE MEDICATIONS AT THE TIME OF DISCHARGE: 1. Lopressor 50 mg 1/2 tablet q.12h. 2. Enteric-coated aspirin 325 mg q.d. 3.  Tylox 1-2 p.o. q.4-6h. p.r.n. pain.  DISCHARGE INSTRUCTIONS:  The patient will follow up with Dr. [name] in two weeks.  He will see Dr. [name]. Owen in three weeks.  DISPOSITION:  The patient was subsequently discharged to home in satisfactory and stable condition on January 30, 2000. DD:  01/29/00 TD:  01/29/00 Job: [id number]

## 2011-04-07 NOTE — Op Note (Signed)
[place]. [facility]  Patient:    Tyler Lawrence, Tyler Lawrence                      MRN: [id number] Proc. Date: 01/26/00 Adm. Date:  [id number] Attending:  Tressie Stalker CC:         [name], M.D.             Artis Delay, M.D.             CVTS office                           Operative Report  PREOPERATIVE DIAGNOSIS:  Severe 2-vessel coronary artery disease with new onset  angina and positive stress Cardiolite examination.  POSTOPERATIVE DIAGNOSIS:  Severe 2-vessel coronary artery disease with new onset angina and positive stress Cardiolite examination.  PROCEDURE:  Median sternotomy for coronary artery bypass grafting x 3 (left internal mammary artery to distal left anterior descending coronary artery, saphenous vein graft to first diagonal branch, saphenous vein graft to first circumflex marginal branch).  SURGEON: [name]. Cornelius Moras, M.D.  ASSISTANT:  Maple Mirza, P.A.  ANESTHESIA:  General.  BRIEF CLINICAL NOTE:  The patient is a 65 year old white male followed by Dr. Artis Delay and referred by Dr. Daphene Jaeger for management of coronary artery disease. The patient has history of hypertension and previous history of tobacco  abuse.  He  presents with new onset angina and underwent stress Cardiolite examination by Dr. Tresa Endo demonstrating classical ischemic changes, both with symptoms, EKG changes, as well as, radiographic changes on ______.  The patient subsequently underwent elective cardiac catheterization by Dr. Tresa Endo that demonstrates severe 2-vessel  coronary artery disease, including a critical 95% stenosis of the left anterior  descending coronary artery located at the takeoff of the first diagonal branch.  This was felt not to be amenable to percutaneous-based intervention.  There was a 50-60% proximal stenosis of the first circumflex marginal branch and a small nondominant right coronary artery.  There was normal left ventricular  function.   OPERATIVE CONSENT:  The patient and his family were counselled at length regarding the indications and potential benefits of coronary artery bypass grafting. They understand the associated risks of surgery, including, but not limited to risk f death, stroke, myocardial infarction, bleeding requiring blood transfusion, arrhythmia, infection, and recurrent coronary artery disease.  They accept these risks, as well as, any unforeseen complications and agreed to proceed with surgery as described.  OPERATIVE NOTE IN DETAIL:  The patient was brought to the operating room on the  above mentioned date and invasive hemodynamic monitoring was established by the  anesthesia service under the care and direction of Dr. Burna Forts.  The patient was placed in the supine position on the operating table.  Following induction with general endotracheal anesthesia, the patients chest, abdomen, both groins and both lower extremities were prepared and draped in a sterile manner.   A median sternotomy incision was performed and the left internal mammary artery was dissected from the chest wall and prepared for bypass grafting.  The left internal mammary artery was notably good quality conduit for bypass grafting. Simultaneously, saphenous vein was obtained from the patients right lower leg through a series of longitudinal incisions.  The saphenous vein was slightly small caliber, but otherwise very good quality conduit for bypass grafting.  The patient was heparinized systemically.  The pericardium was opened.  The  ascending aorta was inspected and was notably ree of any palpable plaques or calcifications.  The ascending aorta and the right atrial appendage were cannulated for cardiopulmonary bypass.  Adequate heparinization was verified.  Cardiopulmonary bypass was begun and the surface of the heart was inspected. Distal sites were selected for coronary artery bypass  grafting.  There was left  dominant coronary circulation.  The left ventricle appears normal.  Portions of  saphenous vein and the left internal mammary artery were trimmed to appropriate  lengths.  A temperature probe was placed in the left ventricular septum and a styrofoam pad was placed to protect the left phrenic nerve from thermal injury. A cardioplegia catheter was placed in the ascending aorta.  The patient was cooled to 32 degrees systemic temperature.  The aortic cross-clamp was applied and cardioplegia was delivered in an antegrade fashion through the aortic root.  Additional doses of cardioplegia were administered both through the aortic root and down subsequently placed vein grafts to maintain septal temperature below 15 degrees Centigrade throughout the cross-clamp portion of the operation. The following distal coronary anastomoses were performed:  1.  The first circumflex marginal branch was grafted with a saphenous vein graft in an end-to-side fashion using running 7-0 Prolene suture.  This coronary measured 1.7 mm in diameter and has a 50% proximal stenosis.  This coronary was of good quality at the site of distal bypass.  2.  The first diagonal branch off the left anterior descending coronary artery as grafted with a saphenous vein graft in an end-to-side fashion using running 7-0  Prolene suture.  This coronary measured 1.3 mm in diameter and has 99% proximal  stenosis.  This coronary was of good quality at the site of distal bypass.  3.  The distal left anterior descending coronary artery was grafted with the left internal mammary artery using running 8-0 Prolene suture.  This coronary measured 2 mm in diameter and was of good quality at the site of distal bypass.  There was 95% proximal stenosis.  Both proximal saphenous vein anastomoses were performed directly to the ascending aorta prior to removal of the aortic cross-clamp. The septal temperature  was noted to rise rapidly and dramatically upon reperfusion f the left internal mammary artery.  All air was removed from the aortic root and the  patient was placed in Trendelenburg position.  The aortic cross-clamp was removed after a total cross-clamp time of 47 minutes.  The heart was defibrillated into a normal sinus rhythm.  All proximal and distal anastomoses were inspected for hemostasis and appropriate graft orientation. Epicardial pacing wires were affixed to the right ventricular outflow tract and to the right atrial appendage.   The patient was rewarmed to greater than 37 degrees Centigrade temperature.  The patient was weaned from cardiopulmonary bypass without difficulty.  The patients rhythm at separation from bypass was normal sinus rhythm.  No inotropic support was required.  Total cardiopulmonary bypass time for the operation was 6 minutes.  The venous and arterial cannulae were removed uneventfully.  Protamine was administered to reverse the anticoagulation.  The mediastinum and the left chest were irrigated with saline solution containing vancomycin.  Meticulous surgical  hemostasis was ascertained.  The mediastinum and the left chest were drained with three chest tubes placed through separate stab incisions inferiorly.  The median sternotomy was closed in routine fashion.  The right lower extremity incisions were closed in multiple layers in routine fashion.  All skin incisions were closed with subcuticular  skin closures.  The patient tolerated the procedure well and was transported to the surgical intensive care unit in stable condition.  There were no intraoperative complications.  All sponge, instrument and needle counts were verified correct t completion of the operation.  No autologous blood products were administered. DD:  01/26/00 TD:  01/27/00 Job: [id number]

## 2011-09-19 ENCOUNTER — Telehealth (INDEPENDENT_AMBULATORY_CARE_PROVIDER_SITE_OTHER): Payer: Self-pay | Admitting: *Deleted

## 2011-09-19 ENCOUNTER — Other Ambulatory Visit (INDEPENDENT_AMBULATORY_CARE_PROVIDER_SITE_OTHER): Payer: Self-pay | Admitting: *Deleted

## 2011-09-19 ENCOUNTER — Encounter (INDEPENDENT_AMBULATORY_CARE_PROVIDER_SITE_OTHER): Payer: Self-pay | Admitting: Internal Medicine

## 2011-09-19 ENCOUNTER — Encounter (INDEPENDENT_AMBULATORY_CARE_PROVIDER_SITE_OTHER): Payer: Self-pay | Admitting: *Deleted

## 2011-09-19 ENCOUNTER — Ambulatory Visit (INDEPENDENT_AMBULATORY_CARE_PROVIDER_SITE_OTHER): Payer: 59 | Admitting: Internal Medicine

## 2011-09-19 VITALS — BP 112/62 | HR 72 | Temp 98.9°F | Ht 68.0 in | Wt 187.7 lb

## 2011-09-19 DIAGNOSIS — K625 Hemorrhage of anus and rectum: Secondary | ICD-10-CM

## 2011-09-19 MED ORDER — PEG-KCL-NACL-NASULF-NA ASC-C 100 G PO SOLR: 1.0000 | Freq: Once | ORAL | Status: DC

## 2011-09-19 NOTE — Progress Notes (Signed)
Subjective:     Patient ID: Tyler Lawrence, male   DOB: 11/09/1946, 66 y.o.   MRN: [id number]  HPIMr. Lawrence is a 65 yr old male presenting today as a referral from Dr. Malvin Lawrence for rectal bleeding.  Rectal bleeding since 17. He tells me he has hemorrhoids.  He see blood when he wipes 3-4 times. It depends if he rides a tractor or has been lifting.  Appetite is good. He does not want to eat meat. He says it smells nasty. No weight loss. He has a BM 3 times a day. No family hx of colon cancer. Hemorrhoidectomy this year by Dr. Malvin Lawrence.  His last colonoscopy was years ago,  He says it was normal. (Over 10 yrs) Review of Systems see hpi     Current Outpatient Prescriptions  Medication Sig Dispense Refill  . aspirin 81 MG tablet Take 81 mg by mouth daily.        . metoprolol (TOPROL-XL) 50 MG 24 hr tablet Take 50 mg by mouth daily.        . niacin (NIASPAN) 750 MG CR tablet Take 750 mg by mouth at bedtime.        Marland Kitchen omeprazole (PRILOSEC) 20 MG capsule Take 20 mg by mouth daily.        . ramipril (ALTACE) 5 MG tablet Take 5 mg by mouth daily.        . simvastatin (ZOCOR) 20 MG tablet Take 20 mg by mouth at bedtime.         Past Surgical History  Procedure Date  . Triple bypass 2002   . Rt hernia repair in march of 2012   . Appendectomy   . Elbow surgery     for a chipped bone   Past Medical History  Diagnosis Date  . CAD (coronary artery disease)   . Hemorrhoid    Family Status  Relation Status Death Age  . Mother Deceased     Natural causes  . Father Deceased     MI age 108  . Sister Alive     Alzheimer's (both)  . Brother Alive     One deceased child birth. One deceased with some type of muscle disease. One okay.   History   Social History Narrative  . No narrative on file   History   Social History  . Marital Status: Married    Spouse Name: N/A    Number of Children: N/A  . Years of Education: N/A   Occupational History  . Not on file.   Social History Main  Topics  . Smoking status: Never Smoker   . Smokeless tobacco: Not on file  . Alcohol Use: No  . Drug Use: No  . Sexually Active: Not on file   Other Topics Concern  . Not on file   Social History Narrative  . No narrative on file   No Known Allergies  Objective:   Physical Exam Filed Vitals:   09/19/11 1423  BP: 112/62  Pulse: 72  Temp: 98.9 F (37.2 C)  Height: 5\' 8"  (1.727 m)  Weight: 187 lb 11.2 oz (85.14 kg)    .Alert and oriented. Skin warm and dry. Oral mucosa is moist. Natural teeth in good condition. Sclera anicteric, conjunctivae is pink. Thyroid not enlarged. No cervical lymphadenopathy. Lungs clear. Heart regular rate and rhythm.  Abdomen is soft. Bowel sounds are positive. No hepatomegaly. No abdominal masses felt. No tenderness. Stool brown and guaiac negative. No edema to  lower extremities. Patient is alert and oriented.      Assessment:    Rectal bleeding. Colonic neoplasm, hemorrhoid needs to be ruled out.    Plan:    Colonoscopy.The risks and benefits such as perforation, bleeding, and infection were reviewed with the patient and is agreeable.

## 2011-09-19 NOTE — Telephone Encounter (Signed)
Patient need movi prep 

## 2011-09-25 NOTE — Patient Instructions (Signed)
Further recommendations once he has undergone the colonoscopy

## 2011-10-23 ENCOUNTER — Encounter (HOSPITAL_COMMUNITY): Payer: Self-pay | Admitting: Pharmacy Technician

## 2011-10-25 ENCOUNTER — Telehealth (INDEPENDENT_AMBULATORY_CARE_PROVIDER_SITE_OTHER): Payer: Self-pay | Admitting: *Deleted

## 2011-10-25 NOTE — Telephone Encounter (Signed)
LM - Would like to cx his procedure scheduled for tomorrow, 10/26/11.  Will CB to reschedule.

## 2011-10-25 NOTE — Telephone Encounter (Signed)
appt canceled

## 2011-10-26 ENCOUNTER — Encounter (HOSPITAL_COMMUNITY): Admission: RE | Payer: Self-pay | Source: Ambulatory Visit

## 2011-10-26 ENCOUNTER — Ambulatory Visit (HOSPITAL_COMMUNITY): Admission: RE | Admit: 2011-10-26 | Payer: 59 | Source: Ambulatory Visit | Admitting: Internal Medicine

## 2011-10-26 SURGERY — COLONOSCOPY
Anesthesia: Moderate Sedation

## 2012-04-23 ENCOUNTER — Encounter (INDEPENDENT_AMBULATORY_CARE_PROVIDER_SITE_OTHER): Payer: Self-pay | Admitting: *Deleted

## 2012-07-16 ENCOUNTER — Encounter (INDEPENDENT_AMBULATORY_CARE_PROVIDER_SITE_OTHER): Payer: Self-pay | Admitting: General Surgery

## 2012-07-16 ENCOUNTER — Ambulatory Visit (INDEPENDENT_AMBULATORY_CARE_PROVIDER_SITE_OTHER): Payer: 59 | Admitting: General Surgery

## 2012-07-16 VITALS — BP 132/70 | HR 84 | Temp 97.2°F | Resp 16 | Ht 68.0 in | Wt 184.6 lb

## 2012-07-16 DIAGNOSIS — K644 Residual hemorrhoidal skin tags: Secondary | ICD-10-CM

## 2012-07-16 DIAGNOSIS — K648 Other hemorrhoids: Secondary | ICD-10-CM

## 2012-07-16 NOTE — Progress Notes (Signed)
Patient ID: Tyler Lawrence, male   DOB: 06-27-1946, 66 y.o.   MRN: [id number]  Chief Complaint  Patient presents with  . Pre-op Exam    eval hems    HPI Tyler Lawrence is a 66 y.o. male.  He is referred, basically by Dr. Sherwood Gambler and Dr. Karilyn Cota, for evaluation of hemorrhoids.  The patient has a chronic history with hemorrhoids off and on. 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 He has intermittent bright red blood per rectum. He says he actually has not had any bleeding for about 3-4 weeks but is having a lot of problems with itching lately. He says he was told he had a fungal infection and was given pills for that. He uses Preparation H. He has normal bowel movements, possibly 3 at day. They are solid,occasionally strains.  He states that he had a right inguinal hernia repair and some type of hemorrhoid procedure by Dr. Malvin Johns in March of 2012. We have requested the records. He says he still has significant external hemorrhoids.  Last colonoscopy was more 10 years ago. He has discussed the possibility of colonoscopy with Dr. Karilyn Cota but that has not been scheduled.  Past history is significant for coronary artery disease, coronary bypass grafting 2002, right inguinal hernia repair and laparoscopic appendectomy. He has hypertension. He is asymptomatic from a cardiovascular standpoint. HPI  Past Medical History  Diagnosis Date  . CAD (coronary artery disease)   . Hemorrhoid     Past Surgical History  Procedure Date  . Triple bypass 2002   . Rt hernia repair in march of 2012   . Appendectomy   . Elbow surgery     for a chipped bone    Family History  Problem Relation Age of Onset  . Heart disease Father   . Heart disease Paternal Grandfather     Social History History  Substance Use Topics  . Smoking status: Never Smoker   . Smokeless tobacco: Never Used  . Alcohol Use: No    No Known Allergies  Current Outpatient Prescriptions    Medication Sig Dispense Refill  . aspirin EC 81 MG tablet Take 81 mg by mouth daily.        . B Complex-Biotin-FA (B-COMPLEX PO) Take 1 tablet by mouth daily.        Marland Kitchen ibuprofen (ADVIL,MOTRIN) 200 MG tablet Take 400 mg by mouth every 6 (six) hours as needed. Pain       . metoprolol (TOPROL-XL) 50 MG 24 hr tablet Take 50 mg by mouth daily.        . niacin (NIASPAN) 750 MG CR tablet Take 1,500 mg by mouth at bedtime.       Marland Kitchen omeprazole (PRILOSEC) 20 MG capsule Take 20 mg by mouth daily.        . peg 3350 powder (MOVIPREP) 100 G SOLR Take 1 kit (100 g total) by mouth once.  1 kit  0  . ramipril (ALTACE) 5 MG tablet Take 5 mg by mouth daily.       . simvastatin (ZOCOR) 20 MG tablet Take 20 mg by mouth at bedtime.          Review of Systems Review of Systems  Constitutional: Positive for diaphoresis. Negative for fever, chills and unexpected weight change.  HENT: Negative for hearing loss, congestion, sore throat, trouble swallowing and voice change.   Eyes: Negative for visual disturbance.  Respiratory: Negative for apnea,  cough, chest tightness, shortness of breath and wheezing.   Cardiovascular: Negative for chest pain, palpitations and leg swelling.  Gastrointestinal: Positive for anal bleeding and rectal pain. Negative for nausea, vomiting, abdominal pain, diarrhea, constipation, blood in stool and abdominal distention.  Genitourinary: Negative for hematuria and difficulty urinating.  Musculoskeletal: Negative for arthralgias.  Skin: Negative for rash and wound.  Neurological: Negative for seizures, syncope, weakness and headaches.  Hematological: Negative for adenopathy. Does not bruise/bleed easily.  Psychiatric/Behavioral: Negative for confusion.    Blood pressure 132/70, pulse 84, temperature 97.2 F (36.2 C), temperature source Temporal, resp. rate 16, height 5\' 8"  (1.727 m), weight 184 lb 9.6 oz (83.734 kg).  Physical Exam Physical Exam  Constitutional: He is oriented to  person, place, and time. He appears well-developed and well-nourished. No distress.  HENT:  Head: Normocephalic.  Nose: Nose normal.  Mouth/Throat: No oropharyngeal exudate.  Eyes: Conjunctivae and EOM are normal. Pupils are equal, round, and reactive to light. Right eye exhibits no discharge. Left eye exhibits no discharge. No scleral icterus.  Neck: Normal range of motion. Neck supple. No JVD present. No tracheal deviation present. No thyromegaly present.  Cardiovascular: Normal rate, regular rhythm, normal heart sounds and intact distal pulses.   No murmur heard. Pulmonary/Chest: Effort normal and breath sounds normal. No stridor. No respiratory distress. He has no wheezes. He has no rales. He exhibits no tenderness.       Midline sternotomy scar well healed.  Abdominal: Soft. Bowel sounds are normal. He exhibits no distension and no mass. There is no tenderness. There is no rebound and no guarding.       Well-healed trocar scars from laparoscopic appendectomy. Well-healed right inguinal incision from hernia surgery. Hernia repair intact.  Genitourinary:       Very large, chronic external hemorrhoids, circumferential, left larger than right. Digital exam reveals normal sphincter tone, no blood. Anoscopy shows some internal hemorrhoids, not bleeding, stage 1-2.  Musculoskeletal: Normal range of motion. He exhibits no edema and no tenderness.  Lymphadenopathy:    He has no cervical adenopathy.  Neurological: He is alert and oriented to person, place, and time. He has normal reflexes. Coordination normal.  Skin: Skin is warm and dry. No rash noted. He is not diaphoretic. No erythema. No pallor.  Psychiatric: He has a normal mood and affect. His behavior is normal. Judgment and thought content normal.    Data Reviewed I have requested Dr. Daisy Blossom operative notes to be sure what was done with his hemorrhoids previously. I have written an office note from Dr. Dionicia Abler.  Assessment      External hemorrhoids with complications of itching, pain. The size of these hemorrhoids is fairly significant.  Internal hemorrhoids with bleeding, currently asymptomatic  Perianal itching. No gross evidence of fungal infection at this time. This may simply be due to moisture and pruritus and out or the fungal infection may be resolving.  Coronary artery disease, status post coronary bypass grafting  History right hernia repair  History laparoscopic appendectomy  Hypertension    Plan    We had a long talk about his hemorrhoids. We talked about the internal hemorrhoids and external hemorrhoids the symptoms associated with each. We talked about strategies for managing his itching and I gave him written information about pruritus ani and he is going to do that.  At the end of the discussion he thinks that he wants to go ahead and have definitive surgery which will require internal and external hemorrhoidectomy,  either 2 or 3 columns. The majority of the surgery will be on the external component I suspect.  I discussed the indications, details, techniques, and numerous risk of the surgery with the patient. He understands these issues. His questions were answered. He agrees with this plan.  His work for Dr. Dr. Karilyn Cota for colonoscopy for colorectal cancer screening. He says he will arrange that himself  We have requested operative summaries from Dr. Malvin Johns  The patient will need cardiac clearance with Dr. Aleen Campi prior to his hemorrhoid surgery under Gen. Anesthesia.       Angelia Mould. Derrell Lolling, M.D., [facility] General and Minimally invasive Surgery Breast and Colorectal Surgery Office:   [phone] Pager:   [phone]  07/16/2012, 10:13 AM

## 2012-07-16 NOTE — Progress Notes (Signed)
Faxed request for cardiac clearance to Dr. Nicki Guadalajara Fax # [phone]. Their main fax # [phone] did not allow the fax to go thru twice, thus sent to the alternate number.  Faxed request for medical records on this patient to the attention of Dr. Barbaraann Barthel fax # [phone]. The office number is [phone]. Per Harriett Sine she had to pull the paper chart on this patient. He has not been seen in their clinic for some time. There was not a date noted in their EMR off a recent visit.

## 2012-07-16 NOTE — Patient Instructions (Signed)
You have large, significant external hemorrhoids which are causing most of your symptoms. You also have some internal hemorrhoids which may be the cause of your bleeding.  You will be scheduled for surgery to remove the internal and external hemorrhoids. If this goes well, we will let you go home the same day.  You are advised to get a colonoscopy with Dr. Karilyn Cota, since it has been greater than 10 years since that was done.  We will ask Dr. Nicki Guadalajara for cardiac clearance for this surgery.   Hemorrhoidectomy Hemorrhoidectomy is surgery to remove hemorrhoids. Hemorrhoids are veins that have become swollen in the rectum. The rectum is the area from the bottom end of the intestines to the opening where bowel movements leave the body. Hemorrhoids can be uncomfortable. They can cause itching, bleeding and pain if a blood clot forms in them (thrombose). If hemorrhoids are small, surgery may not be needed. But if they cover a larger area, surgery is usually suggested.  LET YOUR CAREGIVER KNOW ABOUT:   Any allergies.   All medications you are taking, including:   Herbs, eyedrops, over-the-counter medications and creams.   Blood thinners (anticoagulants), aspirin or other drugs that could affect blood clotting.   Use of steroids (by mouth or as creams).   Previous problems with anesthetics, including local anesthetics.   Possibility of pregnancy, if this applies.   Any history of blood clots.   Any history of bleeding or other blood problems.   Previous surgery.   Smoking history.   Other health problems.  RISKS AND COMPLICATIONS All surgery carries some risk. However, hemorrhoid surgery usually goes smoothly. Possible complications could include:  Urinary retention.   Bleeding.   Infection.   A painful incision.   A reaction to the anesthesia (this is not common).  BEFORE THE PROCEDURE   Stop using aspirin and non-steroidal anti-inflammatory drugs (NSAIDs) for pain  relief. This includes prescription drugs and over-the-counter drugs such as ibuprofen and naproxen. Also stop taking vitamin E. If possible, do this two weeks before your surgery.   If you take blood-thinners, ask your healthcare provider when you should stop taking them.   You will probably have blood and urine tests done several days before your surgery.   Do not eat or drink for about 8 hours before the surgery.   Arrive at least an hour before the surgery, or whenever your surgeon recommends. This will give you time to check in and fill out any needed paperwork.   Hemorrhoidectomy is often an outpatient procedure. This means you will be able to go home the same day. Sometimes, though, people stay overnight in the hospital after the procedure. Ask your surgeon what to expect. Either way, make arrangements in advance for someone to drive you home.  PROCEDURE   The preparation:   You will change into a hospital gown.   You will be given an IV. A needle will be inserted in your arm. Medication can flow directly into your body through this needle.   You might be given an enema to clear your rectum.   Once in the operating room, you will probably lie on your side or be repositioned later to lying on your stomach.   You will be given anesthesia (medication) so you will not feel anything during the surgery. The surgery often is done with local anesthesia (the area near the hemorrhoids will be numb and you will be drowsy but awake). Sometimes, general anesthesia is used (you  will be asleep during the procedure).   The procedure:   There are a few different procedures for hemorrhoids. Be sure to ask you surgeon about the procedure, the risks and benefits.   Be sure to ask about what you need to do to take care of the wound, if there is one.  AFTER THE PROCEDURE  You will stay in a recovery area until the anesthesia has worn off. Your blood pressure and pulse will be checked every so often.     You may feel a lot of pain in the area of the rectum.   Take all pain medication prescribed by your surgeon. Ask before taking any over-the-counter pain medicines.   Sometimes sitting in a warm bath can help relieve your pain.   To make sure you have bowel movements without straining:   You will probably need to take stool softeners (usually a pill) for a few days.   You should drink 8 to 10 glasses of water each day.   Your activity will be restricted for awhile. Ask your caregiver for a list of what you should and should not do while you recover.  Document Released: 09/03/2009 Document Revised: 10/26/2011 Document Reviewed: 09/03/2009 [facility] Patient Information 2012 [place], [place].

## 2012-07-19 ENCOUNTER — Encounter (HOSPITAL_COMMUNITY): Payer: Self-pay | Admitting: Pharmacy Technician

## 2012-07-25 ENCOUNTER — Encounter (HOSPITAL_COMMUNITY)
Admission: RE | Admit: 2012-07-25 | Discharge: 2012-07-25 | Disposition: A | Discharge status: Home / Self Care | Payer: 59 | Source: Ambulatory Visit | Attending: General Surgery | Admitting: General Surgery

## 2012-07-25 ENCOUNTER — Encounter (HOSPITAL_COMMUNITY): Payer: Self-pay

## 2012-07-25 ENCOUNTER — Ambulatory Visit (HOSPITAL_COMMUNITY)
Admission: RE | Admit: 2012-07-25 | Discharge: 2012-07-25 | Disposition: A | Discharge status: Home / Self Care | Payer: 59 | Source: Ambulatory Visit | Attending: General Surgery | Admitting: General Surgery

## 2012-07-25 DIAGNOSIS — Z01812 Encounter for preprocedural laboratory examination: Secondary | ICD-10-CM | POA: Insufficient documentation

## 2012-07-25 DIAGNOSIS — I2510 Atherosclerotic heart disease of native coronary artery without angina pectoris: Secondary | ICD-10-CM | POA: Insufficient documentation

## 2012-07-25 DIAGNOSIS — B001 Herpesviral vesicular dermatitis: Secondary | ICD-10-CM

## 2012-07-25 DIAGNOSIS — Z951 Presence of aortocoronary bypass graft: Secondary | ICD-10-CM | POA: Insufficient documentation

## 2012-07-25 DIAGNOSIS — K649 Unspecified hemorrhoids: Secondary | ICD-10-CM | POA: Insufficient documentation

## 2012-07-25 HISTORY — DX: Adverse effect of unspecified anesthetic, initial encounter: T41.45XA

## 2012-07-25 HISTORY — DX: Other complications of anesthesia, initial encounter: T88.59XA

## 2012-07-25 HISTORY — DX: Palpitations: R00.2

## 2012-07-25 HISTORY — DX: Gastro-esophageal reflux disease without esophagitis: K21.9

## 2012-07-25 HISTORY — DX: Left bundle-branch block, unspecified: I44.7

## 2012-07-25 HISTORY — DX: Other symptoms and signs involving the musculoskeletal system: R29.898

## 2012-07-25 HISTORY — DX: Unspecified osteoarthritis, unspecified site: M19.90

## 2012-07-25 HISTORY — DX: Dorsalgia, unspecified: M54.9

## 2012-07-25 HISTORY — DX: Herpesviral vesicular dermatitis: B00.1

## 2012-07-25 LAB — CBC
HCT: 42.5 % (ref 39.0–52.0)
Hemoglobin: 15 g/dL (ref 13.0–17.0)
MCH: 30.9 pg (ref 26.0–34.0)
MCHC: 35.3 g/dL (ref 30.0–36.0)
RDW: 12.1 % (ref 11.5–15.5)

## 2012-07-25 LAB — BASIC METABOLIC PANEL
BUN: 11 mg/dL (ref 6–23)
Calcium: 9.1 mg/dL (ref 8.4–10.5)
Creatinine, Ser: 0.89 mg/dL (ref 0.50–1.35)
GFR calc non Af Amer: 88 mL/min — ABNORMAL LOW (ref >90)
Glucose, Bld: 106 mg/dL — ABNORMAL HIGH (ref 70–99)

## 2012-07-25 LAB — SURGICAL PCR SCREEN: MRSA, PCR: NEGATIVE

## 2012-07-25 MED ORDER — CHLORHEXIDINE GLUCONATE 4 % EX LIQD: 1.0000 "application " | Freq: Once | CUTANEOUS | Status: DC

## 2012-07-25 MED ORDER — FLEET ENEMA 7-19 GM/118ML RE ENEM: 1.0000 | ENEMA | Freq: Once | RECTAL | Status: DC

## 2012-07-25 NOTE — Pre-Procedure Instructions (Signed)
PT HAS EKG REPORT FROM 01/31/12 AND CARDIOLOGY OFFICE NOTE FROM 04/10/12  FROM DR. TTresa Endo AND NUCLEAR STRESS TEST REPORT 04/04/12 FROM [facility]. CBC, BMET, CXR WERE DONE TODAY AT [facility] PREOP. PREOP INSTRUCTIONS DISCUSSED WITH PT USING TEACH BACK METHOD.

## 2012-07-25 NOTE — Patient Instructions (Signed)
YOUR SURGERY IS SCHEDULED ON:  Friday  9/6  AT 10:45 AM  REPORT TO [place] [facility] AT:  8:45 AM      PHONE # FOR SHORT STAY IS [phone]  FLEET ENEMA IN AM -BEFORE YOUR SURGERY.  DO NOT EAT OR DRINK ANYTHING AFTER MIDNIGHT THE NIGHT BEFORE YOUR SURGERY.  YOU MAY BRUSH YOUR TEETH, RINSE OUT YOUR MOUTH--BUT NO WATER, NO FOOD, NO CHEWING GUM, NO MINTS, NO CANDIES, NO CHEWING TOBACCO.  PLEASE TAKE THE FOLLOWING MEDICATIONS THE AM OF YOUR SURGERY WITH A FEW SIPS OF WATER:  METOPROLOL  AND OMEPRAZOLE    DO NOT BRING VALUABLES, MONEY, CREDIT CARDS.  CONTACT LENS, DENTURES / PARTIALS, GLASSES SHOULD NOT BE WORN TO SURGERY AND IN MOST CASES-HEARING AIDS WILL NEED TO BE REMOVED.  BRING YOUR GLASSES CASE, ANY EQUIPMENT NEEDED FOR YOUR CONTACT LENS. FOR PATIENTS ADMITTED TO THE HOSPITAL--CHECK OUT TIME THE DAY OF DISCHARGE IS 11:00 AM.  ALL INPATIENT ROOMS ARE PRIVATE - WITH BATHROOM, TELEPHONE, TELEVISION AND WIFI INTERNET. IF YOU ARE BEING DISCHARGED THE SAME DAY OF YOUR SURGERY--YOU CAN NOT DRIVE YOURSELF HOME--AND SHOULD NOT GO HOME ALONE BY TAXI OR BUS.  NO DRIVING OR OPERATING MACHINERY FOR 24 HOURS FOLLOWING ANESTHESIA / PAIN MEDICATIONS.                            SPECIAL INSTRUCTIONS:  CHLORHEXIDINE SOAP SHOWER (other brand names are Betasept and Hibiclens ) PLEASE SHOWER WITH CHLORHEXIDINE THE NIGHT BEFORE YOUR SURGERY AND THE AM OF YOUR SURGERY. DO NOT USE CHLORHEXIDINE ON YOUR FACE OR PRIVATE AREAS--YOU MAY USE YOUR NORMAL SOAP THOSE AREAS AND YOUR NORMAL SHAMPOO.  WOMEN SHOULD AVOID SHAVING UNDER ARMS AND SHAVING LEGS 48 HOURS BEFORE USING CHLORHEXIDINE TO AVOID SKIN IRRITATION.  DO NOT USE IF ALLERGIC TO CHLORHEXIDINE.  PLEASE READ OVER ANY  FACT SHEETS THAT YOU WERE GIVEN: MRSA INFORMATION

## 2012-07-25 NOTE — H&P (Signed)
Tyler Lawrence     MRN: [id number]   Description: 66 year old male  Provider: Ernestene Mention, MD  Department: [facility]       Diagnoses     External hemorrhoids with complication   - Primary    455.5    Internal hemorrhoid, bleeding     455.2        Vitals-   BP Pulse Temp Resp Ht Wt    132/70 84 97.2 F (36.2 C) (Temporal) 16 5\' 8"  (1.727 m) 184 lb 9.6 oz (83.734 kg)    BMI - 28.07 kg/m2                History and Phjysical    Ernestene Mention, MD   Patient ID: Tyler Lawrence, male   DOB: 25-Sep-1946, 66 y.o.   MRN: [id number]               HPI Tyler Lawrence is a 66 y.o. male.  He is referred, basically by Dr. Sherwood Gambler and Dr. Karilyn Cota, for evaluation of hemorrhoids.   The patient has a chronic history with hemorrhoids off and on. 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 He has intermittent bright red blood per rectum. He says he actually has not had any bleeding for about 3-4 weeks but is having a lot of problems with itching lately. He says he was told he had a fungal infection and was given pills for that. He uses Preparation H. He has normal bowel movements, possibly 3 at day. They are solid,occasionally strains.   He states that he had a right inguinal hernia repair and some type of hemorrhoid procedure by Dr. Malvin Johns in March of 2012. We have requested the records. He says he still has significant external hemorrhoids.   Last colonoscopy was more 10 years ago. He has discussed the possibility of colonoscopy with Dr. Karilyn Cota but that has not been scheduled.   Past history is significant for coronary artery disease, coronary bypass grafting 2002, right inguinal hernia repair and laparoscopic appendectomy. He has hypertension. He is asymptomatic from a cardiovascular standpoint.     Past Medical History   Diagnosis  Date   .  CAD (coronary artery disease)     .  Hemorrhoid         Past Surgical History     Procedure  Date   .  Triple bypass 2002     .  Rt hernia repair in march of 2012     .  Appendectomy     .  Elbow surgery         for a chipped bone       Family History   Problem  Relation  Age of Onset   .  Heart disease  Father     .  Heart disease  Paternal Grandfather        Social History History   Substance Use Topics   .  Smoking status:  Never Smoker    .  Smokeless tobacco:  Never Used   .  Alcohol Use:  No      No Known Allergies    Current Outpatient Prescriptions   Medication  Sig  Dispense  Refill   .  aspirin EC 81 MG tablet  Take 81 mg by mouth daily.           .  B Complex-Biotin-FA (B-COMPLEX PO)  Take 1 tablet  by mouth daily.           Marland Kitchen  ibuprofen (ADVIL,MOTRIN) 200 MG tablet  Take 400 mg by mouth every 6 (six) hours as needed. Pain          .  metoprolol (TOPROL-XL) 50 MG 24 hr tablet  Take 50 mg by mouth daily.           .  niacin (NIASPAN) 750 MG CR tablet  Take 1,500 mg by mouth at bedtime.          Marland Kitchen  omeprazole (PRILOSEC) 20 MG capsule  Take 20 mg by mouth daily.           .  peg 3350 powder (MOVIPREP) 100 G SOLR  Take 1 kit (100 g total) by mouth once.   1 kit   0   .  ramipril (ALTACE) 5 MG tablet  Take 5 mg by mouth daily.          .  simvastatin (ZOCOR) 20 MG tablet  Take 20 mg by mouth at bedtime.              Review of Systems   Constitutional: Positive for diaphoresis. Negative for fever, chills and unexpected weight change.  HENT: Negative for hearing loss, congestion, sore throat, trouble swallowing and voice change.   Eyes: Negative for visual disturbance.  Respiratory: Negative for apnea, cough, chest tightness, shortness of breath and wheezing.   Cardiovascular: Negative for chest pain, palpitations and leg swelling.  Gastrointestinal: Positive for anal bleeding and rectal pain. Negative for nausea, vomiting, abdominal pain, diarrhea, constipation, blood in stool and abdominal distention.  Genitourinary: Negative for hematuria  and difficulty urinating.  Musculoskeletal: Negative for arthralgias.  Skin: Negative for rash and wound.  Neurological: Negative for seizures, syncope, weakness and headaches.  Hematological: Negative for adenopathy. Does not bruise/bleed easily.  Psychiatric/Behavioral: Negative for confusion.    Blood pressure 132/70, pulse 84, temperature 97.2 F (36.2 C), temperature source Temporal, resp. rate 16, height 5\' 8"  (1.727 m), weight 184 lb 9.6 oz (83.734 kg).   Physical Exam  Constitutional: He is oriented to person, place, and time. He appears well-developed and well-nourished. No distress.  HENT:   Head: Normocephalic.   Nose: Nose normal.   Mouth/Throat: No oropharyngeal exudate.  Eyes: Conjunctivae and EOM are normal. Pupils are equal, round, and reactive to light. Right eye exhibits no discharge. Left eye exhibits no discharge. No scleral icterus.  Neck: Normal range of motion. Neck supple. No JVD present. No tracheal deviation present. No thyromegaly present.  Cardiovascular: Normal rate, regular rhythm, normal heart sounds and intact distal pulses.    No murmur heard. Pulmonary/Chest: Effort normal and breath sounds normal. No stridor. No respiratory distress. He has no wheezes. He has no rales. He exhibits no tenderness.       Midline sternotomy scar well healed.  Abdominal: Soft. Bowel sounds are normal. He exhibits no distension and no mass. There is no tenderness. There is no rebound and no guarding.       Well-healed trocar scars from laparoscopic appendectomy. Well-healed right inguinal incision from hernia surgery. Hernia repair intact.  Genitourinary:       Very large, chronic external hemorrhoids, circumferential, left larger than right. Digital exam reveals normal sphincter tone, no blood. Anoscopy shows some internal hemorrhoids, not bleeding, stage 1-2.  Musculoskeletal: Normal range of motion. He exhibits no edema and no tenderness.  Lymphadenopathy:    He has no  cervical adenopathy.  Neurological: He is alert and oriented to person, place, and time. He has normal reflexes. Coordination normal.  Skin: Skin is warm and dry. No rash noted. He is not diaphoretic. No erythema. No pallor.  Psychiatric: He has a normal mood and affect. His behavior is normal. Judgment and thought content normal.    Data Reviewed I have requested Dr. Daisy Blossom operative notes to be sure what was done with his hemorrhoids previously. I have written an office note from Dr. Dionicia Abler.   Assessment External hemorrhoids with complications of itching, pain. The size of these hemorrhoids is fairly significant.   Internal hemorrhoids with bleeding, currently asymptomatic   Perianal itching. No gross evidence of fungal infection at this time. This may simply be due to moisture and pruritus and out or the fungal infection may be resolving.   Coronary artery disease, status post coronary bypass grafting   History right hernia repair   History laparoscopic appendectomy   Hypertension   Plan We had a long talk about his hemorrhoids. We talked about the internal hemorrhoids and external hemorrhoids the symptoms associated with each. We talked about strategies for managing his itching and I gave him written information about pruritus ani and he is going to do that.   At the end of the discussion he thinks that he wants to go ahead and have definitive surgery which will require internal and external hemorrhoidectomy, either 2 or 3 columns. The majority of the surgery will be on the external component I suspect.   I discussed the indications, details, techniques, and numerous risk of the surgery with the patient. He understands these issues. His questions were answered. He agrees with this plan.   His work for Dr. Dr. Karilyn Cota for colonoscopy for colorectal cancer screening. He says he will arrange that himself   We have requested operative summaries from Dr.  Malvin Johns   The patient will need cardiac clearance with Dr. Aleen Campi prior to his hemorrhoid surgery under Gen. Anesthesia.       Angelia Mould. Derrell Lolling, M.D., [facility] General and Minimally invasive Surgery Breast and Colorectal Surgery Office:   [phone] Pager:   [phone]

## 2012-07-26 ENCOUNTER — Ambulatory Visit (HOSPITAL_COMMUNITY)
Admission: RE | Admit: 2012-07-26 | Discharge: 2012-07-26 | Disposition: A | Discharge status: Home / Self Care | Payer: 59 | Source: Ambulatory Visit | Attending: General Surgery | Admitting: General Surgery

## 2012-07-26 ENCOUNTER — Encounter (HOSPITAL_COMMUNITY): Payer: Self-pay | Admitting: *Deleted

## 2012-07-26 ENCOUNTER — Ambulatory Visit (HOSPITAL_COMMUNITY): Payer: 59 | Admitting: *Deleted

## 2012-07-26 ENCOUNTER — Encounter (HOSPITAL_COMMUNITY)
Admission: RE | Disposition: A | Discharge status: Home / Self Care | Payer: Self-pay | Source: Ambulatory Visit | Attending: General Surgery

## 2012-07-26 DIAGNOSIS — K648 Other hemorrhoids: Secondary | ICD-10-CM | POA: Insufficient documentation

## 2012-07-26 DIAGNOSIS — K644 Residual hemorrhoidal skin tags: Secondary | ICD-10-CM

## 2012-07-26 DIAGNOSIS — Z01812 Encounter for preprocedural laboratory examination: Secondary | ICD-10-CM | POA: Insufficient documentation

## 2012-07-26 HISTORY — PX: HEMORRHOID SURGERY: SHX153

## 2012-07-26 SURGERY — HEMORRHOIDECTOMY
Anesthesia: General | Site: Rectum | Wound class: Contaminated

## 2012-07-26 MED ORDER — 0.9 % SODIUM CHLORIDE (POUR BTL) OPTIME
TOPICAL | Status: DC | PRN
  Administered 2012-07-26: 1000 mL

## 2012-07-26 MED ORDER — ACETAMINOPHEN 10 MG/ML IV SOLN
INTRAVENOUS | Status: DC | PRN
  Administered 2012-07-26: 1000 mg via INTRAVENOUS

## 2012-07-26 MED ORDER — ONDANSETRON HCL 4 MG/2ML IJ SOLN: 4.0000 mg | Freq: Four times a day (QID) | INTRAMUSCULAR | Status: DC | PRN

## 2012-07-26 MED ORDER — KETOROLAC TROMETHAMINE 30 MG/ML IJ SOLN
INTRAMUSCULAR | Status: DC | PRN
  Administered 2012-07-26: 30 mg via INTRAVENOUS

## 2012-07-26 MED ORDER — LIDOCAINE-EPINEPHRINE 1 %-1:100000 IJ SOLN
INTRAMUSCULAR | Status: DC | PRN
  Administered 2012-07-26: 6 mL

## 2012-07-26 MED ORDER — DEXTROSE 5 % IV SOLN
2.0000 g | INTRAVENOUS | Status: AC
  Administered 2012-07-26: 2 g via INTRAVENOUS

## 2012-07-26 MED ORDER — SODIUM CHLORIDE 0.9 % IJ SOLN: 3.0000 mL | Freq: Two times a day (BID) | INTRAMUSCULAR | Status: DC

## 2012-07-26 MED ORDER — HYALURONIDASE OVINE 200 UNIT/ML IJ SOLN
INTRAMUSCULAR | Status: AC
  Filled 2012-07-26: qty 1.2

## 2012-07-26 MED ORDER — DIBUCAINE 1 % RE OINT
TOPICAL_OINTMENT | RECTAL | Status: AC
  Filled 2012-07-26: qty 28

## 2012-07-26 MED ORDER — HYDROCODONE-ACETAMINOPHEN 5-325 MG PO TABS: 1.0000 | ORAL_TABLET | ORAL | Status: AC | PRN

## 2012-07-26 MED ORDER — ACETAMINOPHEN 10 MG/ML IV SOLN
INTRAVENOUS | Status: AC
  Filled 2012-07-26: qty 100

## 2012-07-26 MED ORDER — HYALURONIDASE OVINE 200 UNIT/ML IJ SOLN
INTRAMUSCULAR | Status: DC | PRN
  Administered 2012-07-26: 150 [IU] via SUBCUTANEOUS

## 2012-07-26 MED ORDER — PROPOFOL 10 MG/ML IV EMUL
INTRAVENOUS | Status: DC | PRN
  Administered 2012-07-26: 150 mg via INTRAVENOUS

## 2012-07-26 MED ORDER — LACTATED RINGERS IV SOLN
INTRAVENOUS | Status: DC
  Administered 2012-07-26: 11:00:00 via INTRAVENOUS
  Administered 2012-07-26: 1000 mL via INTRAVENOUS

## 2012-07-26 MED ORDER — LIDOCAINE-EPINEPHRINE 1 %-1:100000 IJ SOLN
INTRAMUSCULAR | Status: AC
  Filled 2012-07-26: qty 1

## 2012-07-26 MED ORDER — PROMETHAZINE HCL 25 MG/ML IJ SOLN: 6.2500 mg | INTRAMUSCULAR | Status: DC | PRN

## 2012-07-26 MED ORDER — ONDANSETRON HCL 4 MG/2ML IJ SOLN
INTRAMUSCULAR | Status: DC | PRN
  Administered 2012-07-26: 4 mg via INTRAVENOUS

## 2012-07-26 MED ORDER — DEXAMETHASONE SODIUM PHOSPHATE 10 MG/ML IJ SOLN
INTRAMUSCULAR | Status: DC | PRN
  Administered 2012-07-26: 10 mg via INTRAVENOUS

## 2012-07-26 MED ORDER — LIDOCAINE HCL (CARDIAC) 20 MG/ML IV SOLN
INTRAVENOUS | Status: DC | PRN
  Administered 2012-07-26: 100 mg via INTRAVENOUS

## 2012-07-26 MED ORDER — HYDROMORPHONE HCL PF 1 MG/ML IJ SOLN: 0.2500 mg | INTRAMUSCULAR | Status: DC | PRN

## 2012-07-26 MED ORDER — LACTATED RINGERS IV SOLN: INTRAVENOUS | Status: DC

## 2012-07-26 MED ORDER — HEPARIN SODIUM (PORCINE) 5000 UNIT/ML IJ SOLN
5000.0000 [IU] | Freq: Once | INTRAMUSCULAR | Status: AC
  Administered 2012-07-26: 5000 [IU] via SUBCUTANEOUS
  Filled 2012-07-26: qty 1

## 2012-07-26 MED ORDER — ACETAMINOPHEN 325 MG PO TABS: 650.0000 mg | ORAL_TABLET | ORAL | Status: DC | PRN

## 2012-07-26 MED ORDER — OXYCODONE HCL 5 MG PO TABS: 5.0000 mg | ORAL_TABLET | ORAL | Status: DC | PRN

## 2012-07-26 MED ORDER — CEFOXITIN SODIUM-DEXTROSE 1-4 GM-% IV SOLR (PREMIX)
INTRAVENOUS | Status: AC
  Filled 2012-07-26: qty 100

## 2012-07-26 MED ORDER — FENTANYL CITRATE 0.05 MG/ML IJ SOLN
INTRAMUSCULAR | Status: DC | PRN
  Administered 2012-07-26: 100 ug via INTRAVENOUS

## 2012-07-26 MED ORDER — MIDAZOLAM HCL 5 MG/5ML IJ SOLN
INTRAMUSCULAR | Status: DC | PRN
  Administered 2012-07-26: 2 mg via INTRAVENOUS

## 2012-07-26 MED ORDER — BUPIVACAINE LIPOSOME 1.3 % IJ SUSP
20.0000 mL | Freq: Once | INTRAMUSCULAR | Status: AC
  Administered 2012-07-26: 35 mL
  Filled 2012-07-26: qty 20

## 2012-07-26 MED ORDER — SODIUM CHLORIDE 0.9 % IJ SOLN: 3.0000 mL | INTRAMUSCULAR | Status: DC | PRN

## 2012-07-26 MED ORDER — ACETAMINOPHEN 650 MG RE SUPP
650.0000 mg | RECTAL | Status: DC | PRN
  Filled 2012-07-26: qty 1

## 2012-07-26 MED ORDER — SUCCINYLCHOLINE CHLORIDE 20 MG/ML IJ SOLN
INTRAMUSCULAR | Status: DC | PRN
  Administered 2012-07-26: 100 mg via INTRAVENOUS

## 2012-07-26 MED ORDER — SODIUM CHLORIDE 0.9 % IV SOLN: INTRAVENOUS | Status: DC

## 2012-07-26 MED ORDER — SODIUM CHLORIDE 0.9 % IV SOLN: 250.0000 mL | INTRAVENOUS | Status: DC | PRN

## 2012-07-26 MED ORDER — MORPHINE SULFATE 10 MG/ML IJ SOLN: 2.0000 mg | INTRAMUSCULAR | Status: DC | PRN

## 2012-07-26 SURGICAL SUPPLY — 36 items
BLADE HEX COATED 2.75 (ELECTRODE) ×2 IMPLANT
BLADE SURG 15 STRL LF DISP TIS (BLADE) ×1 IMPLANT
BLADE SURG 15 STRL SS (BLADE) ×2
BRIEF STRETCH FOR OB PAD LRG (UNDERPADS AND DIAPERS) IMPLANT
CANISTER SUCTION 2500CC (MISCELLANEOUS) ×2 IMPLANT
CLOTH BEACON ORANGE TIMEOUT ST (SAFETY) ×2 IMPLANT
COVER MAYO STAND STRL (DRAPES) IMPLANT
DECANTER SPIKE VIAL GLASS SM (MISCELLANEOUS) ×2 IMPLANT
DRAPE LG THREE QUARTER DISP (DRAPES) ×2 IMPLANT
DRSG PAD ABDOMINAL 8X10 ST (GAUZE/BANDAGES/DRESSINGS) ×1 IMPLANT
ELECT REM PT RETURN 9FT ADLT (ELECTROSURGICAL) ×2
ELECTRODE REM PT RTRN 9FT ADLT (ELECTROSURGICAL) ×1 IMPLANT
GAUZE SPONGE 4X4 16PLY XRAY LF (GAUZE/BANDAGES/DRESSINGS) ×2 IMPLANT
GLOVE BIOGEL PI IND STRL 7.0 (GLOVE) ×1 IMPLANT
GLOVE BIOGEL PI INDICATOR 7.0 (GLOVE) ×1
GLOVE EUDERMIC 7 POWDERFREE (GLOVE) ×2 IMPLANT
GOWN STRL NON-REIN LRG LVL3 (GOWN DISPOSABLE) ×2 IMPLANT
GOWN STRL REIN XL XLG (GOWN DISPOSABLE) ×4 IMPLANT
HEMOSTAT SNOW SURGICEL 2X4 (HEMOSTASIS) IMPLANT
KIT BASIN OR (CUSTOM PROCEDURE TRAY) ×2 IMPLANT
LUBRICANT JELLY K Y 4OZ (MISCELLANEOUS) ×2 IMPLANT
NDL HYPO 25X1 1.5 SAFETY (NEEDLE) ×1 IMPLANT
NDL SAFETY ECLIPSE 18X1.5 (NEEDLE) IMPLANT
NEEDLE HYPO 18GX1.5 SHARP (NEEDLE)
NEEDLE HYPO 25X1 1.5 SAFETY (NEEDLE) ×2 IMPLANT
NS IRRIG 1000ML POUR BTL (IV SOLUTION) ×2 IMPLANT
PACK LITHOTOMY IV (CUSTOM PROCEDURE TRAY) ×2 IMPLANT
PENCIL BUTTON HOLSTER BLD 10FT (ELECTRODE) ×2 IMPLANT
SPONGE GAUZE 4X4 12PLY (GAUZE/BANDAGES/DRESSINGS) ×1 IMPLANT
SPONGE SURGIFOAM ABS GEL 100 (HEMOSTASIS) ×1 IMPLANT
SUT CHROMIC 2 0 SH (SUTURE) ×3 IMPLANT
SUT CHROMIC 3 0 SH 27 (SUTURE) IMPLANT
SUT VIC AB 2-0 SH 18 (SUTURE) ×1 IMPLANT
SYR CONTROL 10ML LL (SYRINGE) ×2 IMPLANT
TOWEL OR 17X26 10 PK STRL BLUE (TOWEL DISPOSABLE) ×2 IMPLANT
YANKAUER SUCT BULB TIP 10FT TU (MISCELLANEOUS) ×2 IMPLANT

## 2012-07-26 NOTE — Anesthesia Preprocedure Evaluation (Signed)
Anesthesia Evaluation  Patient identified by MRN, date of birth, ID band Patient awake    Reviewed: Allergy & Precautions, H&P , NPO status , Patient's Chart, lab work & pertinent test results  Airway Mallampati: III TM Distance: >3 FB Neck ROM: Full    Dental  (+) Dental Advisory Given and Caps,    Pulmonary neg pulmonary ROS,  breath sounds clear to auscultation  Pulmonary exam normal       Cardiovascular hypertension, Pt. on medications and Pt. on home beta blockers + CAD and + CABG (CABG 2001) negative cardio ROS  Rhythm:Regular Rate:Normal  EKG; L BBB   Neuro/Psych negative neurological ROS  negative psych ROS   GI/Hepatic Neg liver ROS, GERD-  ,  Endo/Other  negative endocrine ROS  Renal/GU negative Renal ROS  negative genitourinary   Musculoskeletal negative musculoskeletal ROS (+)   Abdominal   Peds  Hematology negative hematology ROS (+)   Anesthesia Other Findings   Reproductive/Obstetrics negative OB ROS                           Anesthesia Physical Anesthesia Plan  ASA: III  Anesthesia Plan: General   Post-op Pain Management:    Induction: Intravenous  Airway Management Planned: Oral ETT  Additional Equipment:   Intra-op Plan:   Post-operative Plan: Extubation in OR  Informed Consent: I have reviewed the patients History and Physical, chart, labs and discussed the procedure including the risks, benefits and alternatives for the proposed anesthesia with the patient or authorized representative who has indicated his/her understanding and acceptance.   Dental advisory given  Plan Discussed with: CRNA  Anesthesia Plan Comments:         Anesthesia Quick Evaluation

## 2012-07-26 NOTE — Interval H&P Note (Signed)
History and Physical Interval Note:  07/26/2012 10:23 AM  Tyler Lawrence  has presented today for surgery, with the diagnosis of internal and external hemorrhoids  The goals and the various methods of treatment have been discussed with the patient and family. After consideration of risks, benefits and other options for treatment, the patient has consented to  Procedure(s) (LRB) with comments: HEMORRHOIDECTOMY (N/A) - Internal and External Hemorrhoidectomy as a surgical intervention .  The patient's history has been reviewed, patient examined, no change in status, stable for surgery.  I have reviewed the patient's chart and labs.  Questions were answered to the patient's satisfaction.     Ernestene Mention

## 2012-07-26 NOTE — Preoperative (Signed)
Beta Blockers   Reason not to administer Beta Blockers:pt took metoprolol this am 

## 2012-07-26 NOTE — Transfer of Care (Signed)
Immediate Anesthesia Transfer of Care Note  Patient: Tyler Lawrence  Procedure(s) Performed: Procedure(s) (LRB) with comments: HEMORRHOIDECTOMY (N/A) - Internal and External Hemorrhoidectomy  Patient Location: PACU  Anesthesia Type: General  Level of Consciousness: awake, alert  and oriented  Airway & Oxygen Therapy: Patient Spontanous Breathing and Patient connected to face mask oxygen  Post-op Assessment: Report given to PACU RN and Post -op Vital signs reviewed and stable  Post vital signs: Reviewed and stable  Complications: No apparent anesthesia complications

## 2012-07-26 NOTE — Anesthesia Postprocedure Evaluation (Signed)
Anesthesia Post Note  Patient: Tyler Lawrence  Procedure(s) Performed: Procedure(s) (LRB): HEMORRHOIDECTOMY (N/A)  Anesthesia type: General  Patient location: PACU  Post pain: Pain level controlled  Post assessment: Post-op Vital signs reviewed  Last Vitals:  Filed Vitals:   07/26/12 1145  BP: 125/84  Pulse:   Temp:   Resp:     Post vital signs: Reviewed  Level of consciousness: sedated  Complications: No apparent anesthesia complications

## 2012-07-26 NOTE — Op Note (Signed)
Patient Name:           Tyler Lawrence   Date of Surgery:        07/26/2012  Pre op Diagnosis:      Internal and external hemorrhoids  Post op Diagnosis:    Internal and external hemorrhoids  Procedure:                 Internal and external hemorrhoidectomy, 3 columns  Surgeon:                     Angelia Mould. Derrell Lolling, M.D., FACS  Assistant:                      none   Operative Indications:   Tyler Lawrence is a 66 y.o. male. He is referred, basically by Dr. Sherwood Gambler and Dr. Karilyn Cota, for evaluation of hemorrhoids.  The patient has a chronic history with hemorrhoids off and on. 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 He has intermittent bright red blood per rectum. He says he actually has not had any bleeding for about 3-4 weeks but is having a lot of problems with itching and burning lately. He says he was told he had a fungal infection and was given pills for that. He uses Preparation H. He has normal bowel movements, possibly 3 at day. They are solid,occasionally strains.  He states that he had a right inguinal hernia repair and some type of hemorrhoid procedure by Dr. Malvin Johns in March of 2012.  He says he still has significant external hemorrhoids. On exam he has significant external hemorrhoids, left larger than right. He has smaller internal hemorrhoids. His anoderm and the hemorrhoids have very thickened skin. Last colonoscopy was more 10 years ago. He has discussed the possibility of colonoscopy with Dr. Karilyn Cota but that has not been scheduled  Operative Findings:       The patient had external hemorrhoids, largest left lateral, smaller right posterior, and small right anterior. If significant internal hemorrhoids left lateral, minimal internal hemorrhoids right posterior, and no internal hemorrhoids right anterior. This required 3 suture lines  Procedure in Detail:          Following the induction of general endotracheal anesthesia the patient was placed in  lithotomy position in padded stirrups. The perianal area was prepped and draped in a sterile fashion. Intravenous antibiotics were given. Surgical time out was performed. I used a 50% solution of Exparel and injected this circumferentially into the perianal sphincter mechanisms. I injected about 35 cc. I slowly dilated the anal sphincters and so I could gently get 4 fingers through the anal sphincters. I injected the internal hemorrhoids and external hemorrhoids with a solution of 1% Xylocaine with epinephrine, 9 cc, mixed with 1 cc Wydase.  I inserted the anoscope. On the left side I placed a transfixion suture of 2-0 chromic above the internal hemorrhoidal pile. Using electrocautery and submucosal dissection I removed all the internal and external hemorrhoids and then ran a running locking suture of 2-0 chromic  out onto the anoderm. I took great care to reapproximate the dentate line. A similar excision was performed in the right posterior position but I did not have to remove much of the internal component. In the right anterior position I simply excised the external component and closed it with chromic sutures. After the procedure I was able to easily pass my finger through the sphincter  mechanism I could feel a good mucosal bridge between all the suture lines. There was no stenosis. I painted the skin with dibucaine ointment and dry bandages were placed. The patient was taken recovery in stable condition. There were no complications. EBL 15 cc. Complications none. Counts correct.     Angelia Mould. Derrell Lolling, M.D., FACS General and Minimally Invasive Surgery Breast and Colorectal Surgery  07/26/2012 11:27 AM

## 2012-07-29 ENCOUNTER — Encounter (INDEPENDENT_AMBULATORY_CARE_PROVIDER_SITE_OTHER): Payer: Self-pay | Admitting: General Surgery

## 2012-07-29 ENCOUNTER — Encounter (HOSPITAL_COMMUNITY): Payer: Self-pay | Admitting: General Surgery

## 2012-07-29 NOTE — Progress Notes (Signed)
Received faxed reply to cardiac clearance request with last office note dated 04/10/12, clearing patient for surgery. Notation sent to medical records to be scanned into the chart.  Patient had surgery 07/26/12.

## 2012-07-31 ENCOUNTER — Encounter (INDEPENDENT_AMBULATORY_CARE_PROVIDER_SITE_OTHER): Payer: Self-pay

## 2012-09-02 ENCOUNTER — Ambulatory Visit (INDEPENDENT_AMBULATORY_CARE_PROVIDER_SITE_OTHER): Payer: 59 | Admitting: General Surgery

## 2012-09-02 ENCOUNTER — Encounter (INDEPENDENT_AMBULATORY_CARE_PROVIDER_SITE_OTHER): Payer: Self-pay | Admitting: General Surgery

## 2012-09-02 VITALS — BP 118/62 | HR 68 | Temp 97.4°F | Resp 16 | Ht 68.0 in | Wt 181.4 lb

## 2012-09-02 DIAGNOSIS — K648 Other hemorrhoids: Secondary | ICD-10-CM

## 2012-09-02 DIAGNOSIS — K644 Residual hemorrhoidal skin tags: Secondary | ICD-10-CM

## 2012-09-02 NOTE — Progress Notes (Signed)
Patient ID: BOAZ BERISHA, male   DOB: [birth year], 66 y.o.   MRN: [id number] This gentleman underwent internal and external hemorrhoidectomy, 3 column on 07/26/2012. He states he has never felt as good as he does now. Bleeding has essentially stopped. No pain. No itching. Having normal bowel movements.  On exam, perianal area looks good. Almost completely healed. No dermatitis or infection. Excellent clinical result.  Assessment and plan: Internal and external hemorrhoids, recovering uneventfully following complex 3 column internal/external hemorrhoidectomy High fiber low fat diet Hydration Return to see me when necessary   Angelia Mould. Derrell Lolling, M.D., [facility] General and Minimally invasive Surgery Breast and Colorectal Surgery Office:   [phone] Pager:   [phone]

## 2012-09-02 NOTE — Patient Instructions (Signed)
Your perianal skin is healing normally, and you appear to be recovering very well following your hemorrhoid surgery.  You are strongly advised to follow a high-fiber, low-fat dry diet and take extra fiber tablets daily. Drink extra water, as much as 6 glasses of water a day.  Return to see Dr. Derrell Lolling if further problems arise.

## 2012-12-03 ENCOUNTER — Encounter (INDEPENDENT_AMBULATORY_CARE_PROVIDER_SITE_OTHER): Payer: Self-pay

## 2013-02-19 ENCOUNTER — Encounter (INDEPENDENT_AMBULATORY_CARE_PROVIDER_SITE_OTHER): Payer: Self-pay

## 2013-05-06 ENCOUNTER — Encounter: Payer: Self-pay | Admitting: Cardiovascular Disease

## 2013-05-06 ENCOUNTER — Ambulatory Visit (INDEPENDENT_AMBULATORY_CARE_PROVIDER_SITE_OTHER): Payer: Medicare HMO | Admitting: Cardiovascular Disease

## 2013-05-06 VITALS — BP 122/62 | HR 67 | Ht 68.0 in | Wt 181.8 lb

## 2013-05-06 DIAGNOSIS — I10 Essential (primary) hypertension: Secondary | ICD-10-CM

## 2013-05-06 DIAGNOSIS — I447 Left bundle-branch block, unspecified: Secondary | ICD-10-CM | POA: Insufficient documentation

## 2013-05-06 DIAGNOSIS — R5381 Other malaise: Secondary | ICD-10-CM

## 2013-05-06 DIAGNOSIS — I252 Old myocardial infarction: Secondary | ICD-10-CM

## 2013-05-06 DIAGNOSIS — I2510 Atherosclerotic heart disease of native coronary artery without angina pectoris: Secondary | ICD-10-CM | POA: Insufficient documentation

## 2013-05-06 DIAGNOSIS — E782 Mixed hyperlipidemia: Secondary | ICD-10-CM

## 2013-05-06 DIAGNOSIS — R5383 Other fatigue: Secondary | ICD-10-CM

## 2013-05-06 MED ORDER — SIMVASTATIN 20 MG PO TABS: 20.0000 mg | ORAL_TABLET | Freq: Every day | ORAL | Status: DC

## 2013-05-06 MED ORDER — RAMIPRIL 5 MG PO TABS: 5.0000 mg | ORAL_TABLET | Freq: Every day | ORAL | Status: DC

## 2013-05-06 MED ORDER — METOPROLOL SUCCINATE ER 50 MG PO TB24: 50.0000 mg | ORAL_TABLET | Freq: Every day | ORAL | Status: DC

## 2013-05-06 MED ORDER — NIACIN ER (ANTIHYPERLIPIDEMIC) 750 MG PO TBCR: 1500.0000 mg | EXTENDED_RELEASE_TABLET | Freq: Every day | ORAL | Status: DC

## 2013-05-06 NOTE — Progress Notes (Signed)
Patient ID: Tyler Lawrence, male   DOB: [birth year], 67 y.o.   MRN: [id number]     HPI: Tyler Lawrence, is a 67 y.o. male who presents to the office today for a one-year cardiology evaluation.  [name] has known coronary artery disease in March 2001 he underwent CABG surgery with LIMA to the LAD, vein to the diagonal, and vein to circumflex coronary artery. His last cardiac catheterization was done in April 2005 which showed total native occlusion of his LAD but he had LIMA graft. He had patent vein graft was marginal. The vein to the diagonal vessel had mild luminal irregularity. His right eye artery was nondominant and had a 70-80% stenosis. Has a history of hyperlipidemia and has been on combination therapy per his last nuclear perfusion study was on 04/04/2012 which was unchanged from previously and only showed a minimal perfusion defect in the basal anterior basal anteroseptal regions consistent with proximal LAD scar. Otherwise perfusion was excellent without ischemia.  [name] has now returned back to work. He is working at [employer] applied is typically from 3 AM until 9 AM. He goes to bed at approximately 6 PM and wakes up at 2 AM appear he is able to do fair amount of lifting at work but has noticed a sedation in his sternal we suture wire that seems to be constantly inflamed at times has some swelling. Otherwise, he denies any chest pain. He is unaware of palpitations. He is unaware of change in shortness of breath.  Past Medical History  Diagnosis Date  . Hemorrhoid     ARE ITCHING AND UNCOMFORTABLE  . Back pain     PT STATES HE HAS FLARE UPS OF LOWER BACK AND LEFT LEG PAIN -MAYBE A LITTLE NUMBNESS--PT STATES HE WAS TOLD HE HAS A PINCHED NERVE.  Marland Kitchen Complication of anesthesia     PT STATES SURGERY AT [facility] 2012  SPINAL ATTEMPTED FOR HEMORRHOIDECTOMY AND HERNIA REPAIR.  PT STATES WHEN NEEDLE WAS PUT IN HIS BACK - HE FELT ELECTRICAL SHOCK DOWN LEFT GROIN AND LEFT  LEG.  PT STATES THAT HE WAS THEN PUT TO  SLEEP.  . Fever blister 07/25/12    ON UPPER LIP  . Left bundle branch block     CHRONIC  . Heart palpitations   . GERD (gastroesophageal reflux disease)   . Arthritis     HANDS HURT.  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 Weakness of left arm     PT NOT SURE WHAT IS CAUSING THE WEAKNESS--HAS SEEN CHIROPRACTOR FOR NECK PAIN   . CAD (coronary artery disease)     DR. Nicki Guadalajara -CARDIOLOGIST; CABG 2001,HEART CATH 2005 -PATENT GRAFTS, "LUMINAL IRREGULARITY IN THE VEIN TO THE DIAGONAL VESSEL.  HIS RCA WHICH WAS NONDOMINANT, HAD 78% STENOSIS."  NUCLEAR STRESS TEST 04/04/12--"ESSENTIALLY UNCHANGED FROM PRIOR STUDY AND SHOWED A SMALL PERFUSION DEFECT IN THE BASAL ANTERIOR AND BASAL ANTEROSEPTAL REGION CONSISTENT WITH PROXIMAL LAD SCAR. "    Past Surgical History  Procedure Laterality Date  . Triple bypass 2002    . Rt hernia repair in march of 2012      STATES HE ALSO HAD HEMORRHODECTOMY AT THE SAME TIME  . Appendectomy    . Elbow surgery      for a chipped bone  . Hemorroidectomy      MANY YEARS AGO  . Coronary artery bypass graft  01/2000  .  Hemorrhoid surgery  07/26/2012    Procedure: HEMORRHOIDECTOMY;  Surgeon: Ernestene Mention, MD;  Location: [facility];  Service: General;  Laterality: N/A;  Internal and External Hemorrhoidectomy    No Known Allergies  Current Outpatient Prescriptions  Medication Sig Dispense Refill  . aspirin EC 81 MG tablet Take 81 mg by mouth daily.       Marland Kitchen ibuprofen (ADVIL,MOTRIN) 200 MG tablet Take 400 mg by mouth every 6 (six) hours as needed. Pain      . metoprolol (TOPROL-XL) 50 MG 24 hr tablet Take 50 mg by mouth daily with breakfast.       . niacin (NIASPAN) 750 MG CR tablet Take 1,500 mg by mouth at bedtime.       Marland Kitchen omeprazole (PRILOSEC) 20 MG capsule Take 20 mg by mouth daily.       . ramipril (ALTACE) 5 MG tablet Take 1 tablet (5 mg total) by mouth daily with breakfast.  90  tablet  3  . simvastatin (ZOCOR) 20 MG tablet Take 20 mg by mouth at bedtime.        No current facility-administered medications for this visit.    Social he is married has 2 children 4 grandchildren. He quit smoking in 1991. There is no alcohol use. He stays busy at work but does not routinely exercise.  ROS is negative for fever chills night sweats. He denies presyncope or syncope. He denies rashes. He does notice irritation in the midsternal region which can at times vary in size. He denies any exertional type chest pain. He denies wheezing. He denies palpitations. He denies abdominal pain. He there is a remote history of rectal bleeding. He is status post hemorrhoidectomy. He denies claudication. He denies paresthesias. Other system review is negative.   PE BP 122/62  Pulse 67  Ht 5\' 8"  (1.727 m)  Wt 181 lb 12.8 oz (82.464 kg)  BMI 27.65 kg/m2  General: Alert, oriented, no distress.  HEENT: Normocephalic, atraumatic. Pupils round and reactive; sclera anicteric;no lid lag,  Nose without nasal septal hypertrophy Mouth/Parynx benign; Mallinpatti scale  Neck: No JVD, no carotid briuts Lungs: clear to ausculatation and percussion; no wheezing or rales Chest wall reveals a small palpable cystic region which seems to be directly above the sternal wire Heart: RRR, s1 s2 normal  1/6 systolic murmur  Abdomen: soft, nontender; no hepatosplenomehaly, BS+; abdominal aorta nontender and not dilated by palpation. Pulses 2+ Extremities: no clubbing cyanosis or edema, Homan's sign negative  Neurologic: grossly nonfocal  ECG: Normal sinus rhythm with left bundle branch block which is old.   LABS:  BMET    Component Value Date/Time   NA 140 07/25/2012 1055   K 3.9 07/25/2012 1055   CL 103 07/25/2012 1055   CO2 28 07/25/2012 1055   GLUCOSE 106* 07/25/2012 1055   BUN 11 07/25/2012 1055   CREATININE 0.89 07/25/2012 1055   CALCIUM 9.1 07/25/2012 1055   GFRNONAA 88* 07/25/2012 1055   GFRAA >90 07/25/2012 1055       Hepatic Function Panel  No results found for this basename: prot, albumin, ast, alt, alkphos, bilitot, bilidir, ibili     CBC    Component Value Date/Time   WBC 8.7 07/25/2012 1055   RBC 4.86 07/25/2012 1055   HGB 15.0 07/25/2012 1055   HCT 42.5 07/25/2012 1055   PLT 204 07/25/2012 1055   MCV 87.4 07/25/2012 1055   MCH 30.9 07/25/2012 1055   MCHC 35.3 07/25/2012 1055  RDW 12.1 07/25/2012 1055     BNP No results found for this basename: probnp    Lipid Panel  No results found for this basename: chol, trig, hdl, cholhdl, vldl, ldlcalc     RADIOLOGY: No results found.    ASSESSMENT AND PLAN: [name] is now 13 years following his CABG revascularization surgery. His last cardiac catheterization was 9 years ago. His last nuclear perfusion study was one year ago. From a cardiac standpoint he has been without anginal symptomatology or CHF symptomatology to he does have a cystic area above a sternal wire and he did feel a pop while he was lifting at work. It is certainly possible that he may have a sternal wire fracture leading to this irritation. For this reason I am recommending that he undergo a PA and lateral chest x-ray. On his current medical therapy I am recommending a complete set of laboratory be done including a CBC, comprehensive metabolic panel, TSH, lipid panel. We will notify him regarding the results. As long as he remains stable, I will see him in one year for followup cardiology evaluation.     [name], MD, [facility]  05/06/2013 12:03 PM

## 2013-05-06 NOTE — Patient Instructions (Signed)
Your physician recommends that you schedule a follow-up appointment in: 1 YEAR.  He has ordered for you to get a chest xray.  A chest x-ray takes a picture of the organs and structures inside the chest, including the heart, lungs, and blood vessels. This test can show several things, including, whether the heart is enlarges; whether fluid is building up in the lungs; and whether pacemaker / defibrillator leads are still in place.  Your physician recommends that you return for fasting lab work.

## 2013-05-07 ENCOUNTER — Ambulatory Visit (HOSPITAL_COMMUNITY)
Admission: RE | Admit: 2013-05-07 | Discharge: 2013-05-07 | Disposition: A | Discharge status: Home / Self Care | Payer: Medicare HMO | Source: Ambulatory Visit | Attending: Cardiovascular Disease | Admitting: Cardiovascular Disease

## 2013-05-07 DIAGNOSIS — R0789 Other chest pain: Secondary | ICD-10-CM | POA: Insufficient documentation

## 2013-05-07 DIAGNOSIS — I2510 Atherosclerotic heart disease of native coronary artery without angina pectoris: Secondary | ICD-10-CM

## 2013-05-07 LAB — CBC
Hemoglobin: 15.4 g/dL (ref 13.0–17.0)
MCH: 31 pg (ref 26.0–34.0)
MCHC: 35.5 g/dL (ref 30.0–36.0)
Platelets: 165 10*3/uL (ref 150–400)
RBC: 4.96 MIL/uL (ref 4.22–5.81)

## 2013-05-07 LAB — LIPID PANEL
Cholesterol: 131 mg/dL (ref 0–200)
Triglycerides: 101 mg/dL (ref <150)
VLDL: 20 mg/dL (ref 0–40)

## 2013-05-07 LAB — COMPREHENSIVE METABOLIC PANEL
Albumin: 4.2 g/dL (ref 3.5–5.2)
Alkaline Phosphatase: 68 U/L (ref 39–117)
BUN: 16 mg/dL (ref 6–23)
Calcium: 9.2 mg/dL (ref 8.4–10.5)
Chloride: 106 mEq/L (ref 96–112)
Glucose, Bld: 118 mg/dL — ABNORMAL HIGH (ref 70–99)
Potassium: 4.3 mEq/L (ref 3.5–5.3)
Sodium: 141 mEq/L (ref 135–145)
Total Protein: 6.5 g/dL (ref 6.0–8.3)

## 2013-05-09 NOTE — Progress Notes (Signed)
Quick Note:  Left message CXR wnl. Call back with any questions or concerns. ______

## 2013-05-12 ENCOUNTER — Ambulatory Visit: Payer: 59 | Admitting: Cardiovascular Disease

## 2013-06-03 ENCOUNTER — Encounter: Payer: Self-pay | Admitting: *Deleted

## 2013-06-10 ENCOUNTER — Encounter: Payer: Self-pay | Admitting: *Deleted

## 2013-06-16 ENCOUNTER — Other Ambulatory Visit: Payer: Self-pay | Admitting: *Deleted

## 2013-06-16 MED ORDER — NIACIN ER (ANTIHYPERLIPIDEMIC) 750 MG PO TBCR: 1500.0000 mg | EXTENDED_RELEASE_TABLET | Freq: Every day | ORAL | Status: DC

## 2013-09-30 ENCOUNTER — Encounter (INDEPENDENT_AMBULATORY_CARE_PROVIDER_SITE_OTHER): Payer: Self-pay | Admitting: General Surgery

## 2013-09-30 ENCOUNTER — Telehealth (INDEPENDENT_AMBULATORY_CARE_PROVIDER_SITE_OTHER): Payer: Self-pay | Admitting: General Surgery

## 2013-09-30 ENCOUNTER — Ambulatory Visit (INDEPENDENT_AMBULATORY_CARE_PROVIDER_SITE_OTHER): Payer: Medicare HMO | Admitting: General Surgery

## 2013-09-30 VITALS — BP 122/76 | HR 66 | Temp 97.6°F | Resp 16 | Ht 69.0 in | Wt 178.0 lb

## 2013-09-30 DIAGNOSIS — K620 Anal polyp: Secondary | ICD-10-CM

## 2013-09-30 DIAGNOSIS — K621 Rectal polyp: Secondary | ICD-10-CM

## 2013-09-30 NOTE — Progress Notes (Signed)
Patient ID: Tyler Lawrence, male   DOB: 10-05-1946, 67 y.o.   MRN: [id number]  Chief Complaint  Patient presents with  . Routine Post Op    reck hems    HPI Tyler Lawrence is a 67 y.o. male.  He is self referred because of a large anal canal polyp. Dr Sherwood Gambler is his PCP.  This patient underwent internal and external hemorrhoidectomy, 3 columns, on 07/26/2012. He had an excellent result and is very flat he had that surgery. 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  For one to 2 months she's noticed a lump on the outside. There is rare itching rare bleeding. His biggest concern is the lump would not go away it is hard to clean around it. He would like something done.  Comorbidities include coronary disease, status post coronary artery bypass grafting by Dr. Cornelius Moras, followed by Dr. Daphene Jaeger.. No history of MI. Hypertension. GERD. Constipation. HPI  Past Medical History  Diagnosis Date  . Hemorrhoid     ARE ITCHING AND UNCOMFORTABLE  . Back pain     PT STATES HE HAS FLARE UPS OF LOWER BACK AND LEFT LEG PAIN -MAYBE A LITTLE NUMBNESS--PT STATES HE WAS TOLD HE HAS A PINCHED NERVE.  Marland Kitchen Complication of anesthesia     PT STATES SURGERY AT [facility] 2012  SPINAL ATTEMPTED FOR HEMORRHOIDECTOMY AND HERNIA REPAIR.  PT STATES WHEN NEEDLE WAS PUT IN HIS BACK - HE FELT ELECTRICAL SHOCK DOWN LEFT GROIN AND LEFT LEG.  PT STATES THAT HE WAS THEN PUT TO  SLEEP.  . Fever blister 07/25/12    ON UPPER LIP  . Left bundle branch block     CHRONIC  . Heart palpitations   . GERD (gastroesophageal reflux disease)   . Arthritis     HANDS HURT.  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 Weakness of left arm     PT NOT SURE WHAT IS CAUSING THE WEAKNESS--HAS SEEN CHIROPRACTOR FOR NECK PAIN   . CAD (coronary artery disease)     DR. Nicki Guadalajara -CARDIOLOGIST; CABG 2001,HEART CATH 2005 -PATENT GRAFTS, "LUMINAL IRREGULARITY IN THE VEIN TO THE DIAGONAL VESSEL.  HIS RCA WHICH WAS  NONDOMINANT, HAD 78% STENOSIS."  NUCLEAR STRESS TEST 04/04/12--"ESSENTIALLY UNCHANGED FROM PRIOR STUDY AND SHOWED A SMALL PERFUSION DEFECT IN THE BASAL ANTERIOR AND BASAL ANTEROSEPTAL REGION CONSISTENT WITH PROXIMAL LAD SCAR. "    Past Surgical History  Procedure Laterality Date  . Triple bypass 2002    . Rt hernia repair in march of 2012      STATES HE ALSO HAD HEMORRHODECTOMY AT THE SAME TIME  . Appendectomy    . Elbow surgery      for a chipped bone  . Hemorroidectomy      MANY YEARS AGO  . Coronary artery bypass graft  01/2000  . Hemorrhoid surgery  07/26/2012    Procedure: HEMORRHOIDECTOMY;  Surgeon: Ernestene Mention, MD;  Location: [facility];  Service: General;  Laterality: N/A;  Internal and External Hemorrhoidectomy    Family History  Problem Relation Age of Onset  . Heart disease Father   . Heart disease Paternal Grandfather     Social History History  Substance Use Topics  . Smoking status: Former Games developer  . Smokeless tobacco: Never Used     Comment: QUIT 1991  . Alcohol Use: No    No Known Allergies  Current Outpatient  Prescriptions  Medication Sig Dispense Refill  . amoxicillin-clavulanate (AUGMENTIN) 875-125 MG per tablet       . aspirin EC 81 MG tablet Take 81 mg by mouth daily.       Marland Kitchen CHERATUSSIN AC 100-10 MG/5ML syrup       . metoprolol succinate (TOPROL-XL) 50 MG 24 hr tablet Take 1 tablet (50 mg total) by mouth daily with breakfast.  90 tablet  3  . niacin (NIASPAN) 750 MG CR tablet Take 2 tablets (1,500 mg total) by mouth at bedtime.  180 tablet  3  . ramipril (ALTACE) 5 MG tablet Take 1 tablet (5 mg total) by mouth daily with breakfast.  90 tablet  3  . simvastatin (ZOCOR) 20 MG tablet Take 1 tablet (20 mg total) by mouth at bedtime.  90 tablet  3   No current facility-administered medications for this visit.    Review of Systems Review of Systems  Constitutional: Negative for fever, chills and unexpected weight change.  HENT: Negative for congestion,  hearing loss, sore throat, trouble swallowing and voice change.   Eyes: Negative for visual disturbance.  Respiratory: Negative for cough and wheezing.   Cardiovascular: Negative for chest pain, palpitations and leg swelling.  Gastrointestinal: Negative for nausea, vomiting, abdominal pain, diarrhea, constipation, blood in stool, abdominal distention, anal bleeding and rectal pain.  Genitourinary: Negative for hematuria and difficulty urinating.  Musculoskeletal: Negative for arthralgias.  Skin: Negative for rash and wound.  Neurological: Negative for seizures, syncope, weakness and headaches.  Hematological: Negative for adenopathy. Does not bruise/bleed easily.  Psychiatric/Behavioral: Negative for confusion.    Blood pressure 122/76, pulse 66, temperature 97.6 F (36.4 C), temperature source Temporal, resp. rate 16, height 5\' 9"  (1.753 m), weight 178 lb (80.74 kg).  Physical Exam Physical Exam  Constitutional: He is oriented to person, place, and time. He appears well-developed and well-nourished. No distress.  HENT:  Head: Normocephalic.  Nose: Nose normal.  Mouth/Throat: No oropharyngeal exudate.  Eyes: Conjunctivae and EOM are normal. Pupils are equal, round, and reactive to light. Right eye exhibits no discharge. Left eye exhibits no discharge. No scleral icterus.  Neck: Normal range of motion. Neck supple. No JVD present. No tracheal deviation present. No thyromegaly present.  Cardiovascular: Normal rate, regular rhythm, normal heart sounds and intact distal pulses.   No murmur heard. Pulmonary/Chest: Effort normal and breath sounds normal. No stridor. No respiratory distress. He has no wheezes. He has no rales. He exhibits no tenderness.  Well healed sternotomy scar.  Abdominal: Soft. Bowel sounds are normal. He exhibits no distension and no mass. There is no tenderness. There is no rebound and no guarding.  Genitourinary:  Large fibroepithelial polyp left side. This goes up  at least to the dentate line it is difficult to see the base. No sign of malignancy. Otherwise skin healthy.  Musculoskeletal: Normal range of motion. He exhibits no edema and no tenderness.  Lymphadenopathy:    He has no cervical adenopathy.  Neurological: He is alert and oriented to person, place, and time. He has normal reflexes. Coordination normal.  Skin: Skin is warm and dry. No rash noted. He is not diaphoretic. No erythema. No pallor.  Psychiatric: He has a normal mood and affect. His behavior is normal. Judgment and thought content normal.    Data Reviewed My old records  Assessment    Large fibroepithelial anal canal polyp, left side. This were required better exposure that I can provide in the office for excision  and bleeding control. He would like to have this done sometime around Thanksgiving.  Coronary artery disease, status post CABG  Hypertension     Plan    Scheduled for examination under anesthesia, excision anal canal polyp as outpatient near future  His indication for details, techniques, and numerous risks of the surgery with him. He is aware of the risk of bleeding, infection, sphincter damage and other than 15 pounds. At this time all his questions were answered. He agrees with this plan.        Angelia Mould. Derrell Lolling, M.D., [facility] General and Minimally invasive Surgery Breast and Colorectal Surgery Office:   [phone] Pager:   [phone]  09/30/2013, 10:38 AM

## 2013-09-30 NOTE — Telephone Encounter (Signed)
Patient met with surgery scheduler went over financial responsibilities, patient will call back to schedule, face sheet placed in pending

## 2013-09-30 NOTE — Patient Instructions (Signed)
You have a large rectal polyp on the left side. This is almost certainly benign and may be an overgrowth of tissue due to your previous surgery and constipation.  Because this is attached up inside, this will need to be done in the operating room. You may go home the same day and return to work 2 days later.  You will be given a rectal preparation to include enemas to clean out the lower end just before the surgery.

## 2014-01-14 ENCOUNTER — Telehealth: Payer: Self-pay | Admitting: Cardiovascular Disease

## 2014-01-14 DIAGNOSIS — E782 Mixed hyperlipidemia: Secondary | ICD-10-CM

## 2014-01-14 DIAGNOSIS — Z79899 Other long term (current) drug therapy: Secondary | ICD-10-CM

## 2014-01-14 NOTE — Telephone Encounter (Signed)
Would like to know if there is a medication eqivualent to his Niacin ER 750 mg .. The price of the medication went from being 45.00 to 379.00 for 30 days. Need to have something that is financially good for them .Marland Kitchen Thanks

## 2014-01-14 NOTE — Telephone Encounter (Signed)
Message sent to Dr Kelly/Kristin/Wanda to advise

## 2014-01-19 NOTE — Telephone Encounter (Signed)
Please call-still waiting to hear about what he can take in the place of Niaspan.

## 2014-01-22 NOTE — Telephone Encounter (Signed)
Tried to contact patient on multiple numbers. I will be out of the office tomorrow. Will try again next day in the office.

## 2014-01-30 MED ORDER — SIMVASTATIN 40 MG PO TABS: 40.0000 mg | ORAL_TABLET | Freq: Every day | ORAL | Status: DC

## 2014-01-30 NOTE — Telephone Encounter (Signed)
Called and informed patient that since his insurance company will not cover his niacin, then Dr. Claiborne Billings recommends that he double his simvastatin from 20 mg to 40mg . I will send patient a new prescription along with a lab slip to have blood checked in approximately 6-8 weeks after simvastatin increase. Patient voiced understanding of instructions.

## 2014-03-27 LAB — LIPID PANEL
Cholesterol: 110 mg/dL (ref 0–200)
HDL: 36 mg/dL — ABNORMAL LOW (ref >39)
LDL CALC: 62 mg/dL (ref 0–99)
Total CHOL/HDL Ratio: 3.1 Ratio
Triglycerides: 59 mg/dL (ref <150)
VLDL: 12 mg/dL (ref 0–40)

## 2014-03-27 LAB — COMPREHENSIVE METABOLIC PANEL
ALK PHOS: 93 U/L (ref 39–117)
ALT: 20 U/L (ref 0–53)
AST: 21 U/L (ref 0–37)
Albumin: 4.1 g/dL (ref 3.5–5.2)
BUN: 10 mg/dL (ref 6–23)
CO2: 26 mEq/L (ref 19–32)
Calcium: 9.3 mg/dL (ref 8.4–10.5)
Chloride: 106 mEq/L (ref 96–112)
Creat: 0.89 mg/dL (ref 0.50–1.35)
Glucose, Bld: 100 mg/dL — ABNORMAL HIGH (ref 70–99)
Potassium: 4.3 mEq/L (ref 3.5–5.3)
SODIUM: 139 meq/L (ref 135–145)
Total Bilirubin: 1 mg/dL (ref 0.2–1.2)
Total Protein: 6.3 g/dL (ref 6.0–8.3)

## 2014-03-30 ENCOUNTER — Encounter: Payer: Self-pay | Admitting: Gastroenterology

## 2014-03-30 ENCOUNTER — Encounter (INDEPENDENT_AMBULATORY_CARE_PROVIDER_SITE_OTHER): Payer: Self-pay

## 2014-03-30 ENCOUNTER — Ambulatory Visit (INDEPENDENT_AMBULATORY_CARE_PROVIDER_SITE_OTHER): Payer: Medicare HMO | Admitting: Gastroenterology

## 2014-03-30 VITALS — BP 118/73 | HR 59 | Temp 98.4°F | Ht 69.0 in | Wt 169.0 lb

## 2014-03-30 DIAGNOSIS — K5900 Constipation, unspecified: Secondary | ICD-10-CM | POA: Insufficient documentation

## 2014-03-30 DIAGNOSIS — K625 Hemorrhage of anus and rectum: Secondary | ICD-10-CM | POA: Insufficient documentation

## 2014-03-30 MED ORDER — HYDROCORTISONE 2.5 % RE CREA: 1.0000 "application " | TOPICAL_CREAM | Freq: Two times a day (BID) | RECTAL | Status: DC

## 2014-03-30 NOTE — Patient Instructions (Addendum)
We have scheduled you for a colonoscopy with Dr. Gala Romney for further evaluation.  I have sent a cream to your pharmacy to use twice per rectum twice a day for 7 days.   I have also provided samples of a constipation medication called Amitiza. Take 1 gelcap each evening WITH FOOD to avoid nausea. You may increase this to twice a day if needed. Please let us know how you do with this. If it works well, I can send in a prescription for it.

## 2014-03-30 NOTE — Progress Notes (Signed)
Primary Care Physician:  Glo Herring., MD Primary Gastroenterologist:  Dr. Gala Romney   Chief Complaint  Patient presents with  . Hemorrhoids    HPI:   Tyler Lawrence presents today as a self-referral secondary to rectal bleeding and possible hemorrhoids. Last colonoscopy in remote past, over 10 years ago. Hemorrhoidectomy Sept 2013 by Dr. Dalbert Batman. States he has a recurrent hemorrhoid. Intermittent rectal bleeding. Worse after riding a tractor. States he has had issues since 68 years old. Rectal itching noted. Stool softener as needed. Culturelle daily. Takes Miralax once per day. Intermittent constipation "if not careful". Would like to try something else. No abdominal pain. No unexplained weight loss. States intentionally trying to lose weight after being told he was borderline diabetic. Dropped from 180s to 169 purposefully. Lately has felt fatigued. If eats late has indigestion, otherwise well controlled with diet.   Past Medical History  Diagnosis Date  . Hemorrhoid     ARE ITCHING AND UNCOMFORTABLE  . Back pain     PT STATES HE HAS FLARE UPS OF LOWER BACK AND LEFT LEG PAIN -MAYBE A LITTLE NUMBNESS--PT STATES HE WAS TOLD HE HAS A PINCHED NERVE.  Marland Kitchen Complication of anesthesia     PT STATES SURGERY AT [facility] [place] MARCH 2012  SPINAL ATTEMPTED FOR HEMORRHOIDECTOMY AND HERNIA REPAIR.  PT STATES WHEN NEEDLE WAS PUT IN HIS BACK - HE FELT ELECTRICAL SHOCK DOWN LEFT GROIN AND LEFT LEG.  PT STATES THAT HE WAS THEN PUT TO  SLEEP.  . Fever blister 07/25/12    ON UPPER LIP  . Left bundle branch block     CHRONIC  . Heart palpitations   . GERD (gastroesophageal reflux disease)   . Arthritis     HANDS HURT.  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 Weakness of left arm     PT NOT SURE WHAT IS CAUSING THE WEAKNESS--HAS SEEN CHIROPRACTOR FOR NECK PAIN   . CAD (coronary artery disease)     DR. [place]; CABG 2001,HEART CATH 2005 -PATENT  GRAFTS, "LUMINAL IRREGULARITY IN THE VEIN TO THE DIAGONAL VESSEL.  HIS RCA WHICH WAS NONDOMINANT, HAD 78% STENOSIS."  NUCLEAR STRESS TEST 04/04/12--"ESSENTIALLY UNCHANGED FROM PRIOR STUDY AND SHOWED A SMALL PERFUSION DEFECT IN THE BASAL ANTERIOR AND BASAL ANTEROSEPTAL REGION CONSISTENT WITH PROXIMAL LAD SCAR. "    Past Surgical History  Procedure Laterality Date  . Rt hernia repair in march of 2012      STATES HE ALSO HAD HEMORRHODECTOMY AT THE SAME TIME  . Appendectomy    . Elbow surgery      for a chipped bone  . Hemorroidectomy      MANY YEARS AGO  . Coronary artery bypass graft  01/2000  . Hemorrhoid surgery  07/26/2012    Dr. Dalbert Batman  . Colonoscopy      remote past, can't remember    Current Outpatient Prescriptions  Medication Sig Dispense Refill  . amoxicillin-clavulanate (AUGMENTIN) 875-125 MG per tablet       . aspirin EC 81 MG tablet Take 81 mg by mouth daily.       Marland Kitchen CHERATUSSIN AC 100-10 MG/5ML syrup       . docusate sodium (COLACE) 100 MG capsule Take 100 mg by mouth 2 (two) times daily.      . fluticasone (FLONASE) 50 MCG/ACT nasal spray Place 1 spray into both nostrils daily.       Marland Kitchen  metoprolol succinate (TOPROL-XL) 50 MG 24 hr tablet Take 1 tablet (50 mg total) by mouth daily with breakfast.  90 tablet  3  . Probiotic Product (PROBIOTIC DAILY PO) Take by mouth daily.      . ramipril (ALTACE) 5 MG tablet Take 1 tablet (5 mg total) by mouth daily with breakfast.  90 tablet  3  . simvastatin (ZOCOR) 40 MG tablet Take 1 tablet (40 mg total) by mouth at bedtime.  30 tablet  6   No current facility-administered medications for this visit.    Allergies as of 03/30/2014  . (No Known Allergies)    Family History  Problem Relation Age of Onset  . Heart disease Father   . Heart disease Paternal Grandfather   . Colon cancer Neg Hx     History   Social History  . Marital Status: Married    Spouse Name: N/A    Number of Children: N/A  . Years of Education: N/A    Occupational History  . Not on file.   Social History Main Topics  . Smoking status: Former Research scientist (life sciences)  . Smokeless tobacco: Never Used     Comment: QUIT 1991  . Alcohol Use: No  . Drug Use: No  . Sexual Activity: Not on file   Other Topics Concern  . Not on file   Social History Narrative  . No narrative on file    Review of Systems: As mentioned in HPI.   Physical Exam: BP 118/73  Pulse 59  Temp(Src) 98.4 F (36.9 C) (Oral)  Ht 5\' 9"  (1.753 m)  Wt 169 lb (76.658 kg)  BMI 24.95 kg/m2 General:   Alert and oriented. Pleasant and cooperative. Well-nourished and well-developed.  Head:  Normocephalic and atraumatic. Eyes:  Without icterus, sclera clear and conjunctiva pink.  Ears:  Normal auditory acuity. Nose:  No deformity, discharge,  or lesions. Mouth:  No deformity or lesions, oral mucosa pink.  Neck:  Supple, without mass or thyromegaly. Lungs:  Clear to auscultation bilaterally. No wheezes, rales, or rhonchi. No distress.  Heart:  S1, S2 present without murmurs appreciated.  Abdomen:  +BS, soft, non-tender and non-distended. No HSM noted. No guarding or rebound. No masses appreciated.  Rectal:  Towards anterior of rectal opening, small tongue-like protrusion, doesn't appear to be prolapsed internal hemorrhoid, ?hemorrhoidal tag vs polyp  Msk:  Symmetrical without gross deformities. Normal posture. Extremities:  Without clubbing or edema. Neurologic:  Alert and  oriented x4;  grossly normal neurologically. Skin:  Intact without significant lesions or rashes. Cervical Nodes:  No significant cervical adenopathy. Psych:  Alert and cooperative. Normal mood and affect.

## 2014-03-31 ENCOUNTER — Encounter (HOSPITAL_COMMUNITY): Payer: Self-pay | Admitting: Pharmacy Technician

## 2014-03-31 NOTE — Assessment & Plan Note (Signed)
68 year old male with intermittent rectal bleeding, history of hemorrhoids s/p hemorrhoidectomy in 2013 by Dr. Dalbert Batman, with last colonoscopy in remote past. Question of skin tag vs small polyp on rectal exam. Will treat symptomatically for hemorrhoids with addition of Amitiza for constipation. Needs lower GI evaluation for further assessment. Likely hematochezia is a benign anorectal source.   Proceed with TCS with Dr. Gala Romney in near future: the risks, benefits, and alternatives have been discussed with the patient in detail. The patient states understanding and desires to proceed.

## 2014-03-31 NOTE — Assessment & Plan Note (Signed)
Start Amitiza 61mcg po BID. Samples provided.

## 2014-04-01 NOTE — Progress Notes (Signed)
cc'd to pcp 

## 2014-04-14 ENCOUNTER — Encounter: Payer: Self-pay | Admitting: *Deleted

## 2014-04-20 ENCOUNTER — Encounter (HOSPITAL_COMMUNITY): Payer: Self-pay | Admitting: *Deleted

## 2014-04-20 ENCOUNTER — Encounter (HOSPITAL_COMMUNITY)
Admission: RE | Disposition: A | Discharge status: Home / Self Care | Payer: Self-pay | Source: Ambulatory Visit | Attending: Internal Medicine

## 2014-04-20 ENCOUNTER — Ambulatory Visit (HOSPITAL_COMMUNITY)
Admission: RE | Admit: 2014-04-20 | Discharge: 2014-04-20 | Disposition: A | Discharge status: Home / Self Care | Payer: Medicare HMO | Source: Ambulatory Visit | Attending: Internal Medicine | Admitting: Internal Medicine

## 2014-04-20 DIAGNOSIS — Z7982 Long term (current) use of aspirin: Secondary | ICD-10-CM | POA: Insufficient documentation

## 2014-04-20 DIAGNOSIS — Z951 Presence of aortocoronary bypass graft: Secondary | ICD-10-CM | POA: Insufficient documentation

## 2014-04-20 DIAGNOSIS — L290 Pruritus ani: Secondary | ICD-10-CM | POA: Insufficient documentation

## 2014-04-20 DIAGNOSIS — M19049 Primary osteoarthritis, unspecified hand: Secondary | ICD-10-CM | POA: Insufficient documentation

## 2014-04-20 DIAGNOSIS — I2510 Atherosclerotic heart disease of native coronary artery without angina pectoris: Secondary | ICD-10-CM | POA: Insufficient documentation

## 2014-04-20 DIAGNOSIS — K625 Hemorrhage of anus and rectum: Secondary | ICD-10-CM

## 2014-04-20 DIAGNOSIS — K921 Melena: Secondary | ICD-10-CM | POA: Insufficient documentation

## 2014-04-20 DIAGNOSIS — K5900 Constipation, unspecified: Secondary | ICD-10-CM | POA: Insufficient documentation

## 2014-04-20 DIAGNOSIS — K649 Unspecified hemorrhoids: Secondary | ICD-10-CM

## 2014-04-20 DIAGNOSIS — Z79899 Other long term (current) drug therapy: Secondary | ICD-10-CM | POA: Insufficient documentation

## 2014-04-20 HISTORY — PX: COLONOSCOPY: SHX5424

## 2014-04-20 SURGERY — COLONOSCOPY
Anesthesia: Moderate Sedation

## 2014-04-20 MED ORDER — ONDANSETRON HCL 4 MG/2ML IJ SOLN
INTRAMUSCULAR | Status: AC
  Filled 2014-04-20: qty 2

## 2014-04-20 MED ORDER — ONDANSETRON HCL 4 MG/2ML IJ SOLN
INTRAMUSCULAR | Status: DC | PRN
  Administered 2014-04-20: 4 mg via INTRAVENOUS

## 2014-04-20 MED ORDER — SODIUM CHLORIDE 0.9 % IV SOLN
INTRAVENOUS | Status: DC
  Administered 2014-04-20: 08:00:00 via INTRAVENOUS

## 2014-04-20 MED ORDER — MEPERIDINE HCL 100 MG/ML IJ SOLN
INTRAMUSCULAR | Status: DC | PRN
  Administered 2014-04-20: 50 mg via INTRAVENOUS
  Administered 2014-04-20: 25 mg via INTRAVENOUS

## 2014-04-20 MED ORDER — MEPERIDINE HCL 100 MG/ML IJ SOLN
INTRAMUSCULAR | Status: AC
  Filled 2014-04-20: qty 2

## 2014-04-20 MED ORDER — MIDAZOLAM HCL 5 MG/5ML IJ SOLN
INTRAMUSCULAR | Status: DC | PRN
  Administered 2014-04-20: 2 mg via INTRAVENOUS
  Administered 2014-04-20 (×3): 1 mg via INTRAVENOUS

## 2014-04-20 MED ORDER — MIDAZOLAM HCL 5 MG/5ML IJ SOLN
INTRAMUSCULAR | Status: AC
  Filled 2014-04-20: qty 10

## 2014-04-20 NOTE — H&P (View-Only) (Signed)
Primary Care Physician:  Glo Herring., MD Primary Gastroenterologist:  Dr. Gala Romney   Chief Complaint  Patient presents with  . Hemorrhoids    HPI:   Tyler Lawrence presents today as a self-referral secondary to rectal bleeding and possible hemorrhoids. Last colonoscopy in remote past, over 10 years ago. Hemorrhoidectomy Sept 2013 by Dr. Dalbert Batman. States he has a recurrent hemorrhoid. Intermittent rectal bleeding. Worse after riding a tractor. States he has had issues since 68 years old. Rectal itching noted. Stool softener as needed. Culturelle daily. Takes Miralax once per day. Intermittent constipation "if not careful". Would like to try something else. No abdominal pain. No unexplained weight loss. States intentionally trying to lose weight after being told he was borderline diabetic. Dropped from 180s to 169 purposefully. Lately has felt fatigued. If eats late has indigestion, otherwise well controlled with diet.   Past Medical History  Diagnosis Date  . Hemorrhoid     ARE ITCHING AND UNCOMFORTABLE  . Back pain     PT STATES HE HAS FLARE UPS OF LOWER BACK AND LEFT LEG PAIN -MAYBE A LITTLE NUMBNESS--PT STATES HE WAS TOLD HE HAS A PINCHED NERVE.  Marland Kitchen Complication of anesthesia     PT STATES SURGERY AT [facility] [place] MARCH 2012  SPINAL ATTEMPTED FOR HEMORRHOIDECTOMY AND HERNIA REPAIR.  PT STATES WHEN NEEDLE WAS PUT IN HIS BACK - HE FELT ELECTRICAL SHOCK DOWN LEFT GROIN AND LEFT LEG.  PT STATES THAT HE WAS THEN PUT TO  SLEEP.  . Fever blister 07/25/12    ON UPPER LIP  . Left bundle branch block     CHRONIC  . Heart palpitations   . GERD (gastroesophageal reflux disease)   . Arthritis     HANDS HURT.  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 Weakness of left arm     PT NOT SURE WHAT IS CAUSING THE WEAKNESS--HAS SEEN CHIROPRACTOR FOR NECK PAIN   . CAD (coronary artery disease)     DR. [place]; CABG 2001,HEART CATH 2005 -PATENT  GRAFTS, "LUMINAL IRREGULARITY IN THE VEIN TO THE DIAGONAL VESSEL.  HIS RCA WHICH WAS NONDOMINANT, HAD 78% STENOSIS."  NUCLEAR STRESS TEST 04/04/12--"ESSENTIALLY UNCHANGED FROM PRIOR STUDY AND SHOWED A SMALL PERFUSION DEFECT IN THE BASAL ANTERIOR AND BASAL ANTEROSEPTAL REGION CONSISTENT WITH PROXIMAL LAD SCAR. "    Past Surgical History  Procedure Laterality Date  . Rt hernia repair in march of 2012      STATES HE ALSO HAD HEMORRHODECTOMY AT THE SAME TIME  . Appendectomy    . Elbow surgery      for a chipped bone  . Hemorroidectomy      MANY YEARS AGO  . Coronary artery bypass graft  01/2000  . Hemorrhoid surgery  07/26/2012    Dr. Dalbert Batman  . Colonoscopy      remote past, can't remember    Current Outpatient Prescriptions  Medication Sig Dispense Refill  . amoxicillin-clavulanate (AUGMENTIN) 875-125 MG per tablet       . aspirin EC 81 MG tablet Take 81 mg by mouth daily.       Marland Kitchen CHERATUSSIN AC 100-10 MG/5ML syrup       . docusate sodium (COLACE) 100 MG capsule Take 100 mg by mouth 2 (two) times daily.      . fluticasone (FLONASE) 50 MCG/ACT nasal spray Place 1 spray into both nostrils daily.       Marland Kitchen  metoprolol succinate (TOPROL-XL) 50 MG 24 hr tablet Take 1 tablet (50 mg total) by mouth daily with breakfast.  90 tablet  3  . Probiotic Product (PROBIOTIC DAILY PO) Take by mouth daily.      . ramipril (ALTACE) 5 MG tablet Take 1 tablet (5 mg total) by mouth daily with breakfast.  90 tablet  3  . simvastatin (ZOCOR) 40 MG tablet Take 1 tablet (40 mg total) by mouth at bedtime.  30 tablet  6   No current facility-administered medications for this visit.    Allergies as of 03/30/2014  . (No Known Allergies)    Family History  Problem Relation Age of Onset  . Heart disease Father   . Heart disease Paternal Grandfather   . Colon cancer Neg Hx     History   Social History  . Marital Status: Married    Spouse Name: N/A    Number of Children: N/A  . Years of Education: N/A    Occupational History  . Not on file.   Social History Main Topics  . Smoking status: Former Research scientist (life sciences)  . Smokeless tobacco: Never Used     Comment: QUIT 1991  . Alcohol Use: No  . Drug Use: No  . Sexual Activity: Not on file   Other Topics Concern  . Not on file   Social History Narrative  . No narrative on file    Review of Systems: As mentioned in HPI.   Physical Exam: BP 118/73  Pulse 59  Temp(Src) 98.4 F (36.9 C) (Oral)  Ht 5\' 9"  (1.753 m)  Wt 169 lb (76.658 kg)  BMI 24.95 kg/m2 General:   Alert and oriented. Pleasant and cooperative. Well-nourished and well-developed.  Head:  Normocephalic and atraumatic. Eyes:  Without icterus, sclera clear and conjunctiva pink.  Ears:  Normal auditory acuity. Nose:  No deformity, discharge,  or lesions. Mouth:  No deformity or lesions, oral mucosa pink.  Neck:  Supple, without mass or thyromegaly. Lungs:  Clear to auscultation bilaterally. No wheezes, rales, or rhonchi. No distress.  Heart:  S1, S2 present without murmurs appreciated.  Abdomen:  +BS, soft, non-tender and non-distended. No HSM noted. No guarding or rebound. No masses appreciated.  Rectal:  Towards anterior of rectal opening, small tongue-like protrusion, doesn't appear to be prolapsed internal hemorrhoid, ?hemorrhoidal tag vs polyp  Msk:  Symmetrical without gross deformities. Normal posture. Extremities:  Without clubbing or edema. Neurologic:  Alert and  oriented x4;  grossly normal neurologically. Skin:  Intact without significant lesions or rashes. Cervical Nodes:  No significant cervical adenopathy. Psych:  Alert and cooperative. Normal mood and affect.

## 2014-04-20 NOTE — Discharge Instructions (Addendum)
Colonoscopy Discharge Instructions  Read the instructions outlined below and refer to this sheet in the next few weeks. These discharge instructions provide you with general information on caring for yourself after you leave the hospital. Your doctor may also give you specific instructions. While your treatment has been planned according to the most current medical practices available, unavoidable complications occasionally occur. If you have any problems or questions after discharge, call Dr. Gala Romney at [phone]. ACTIVITY  You may resume your regular activity, but move at a slower pace for the next 24 hours.   Take frequent rest periods for the next 24 hours.   Walking will help get rid of the air and reduce the bloated feeling in your belly (abdomen).   No driving for 24 hours (because of the medicine (anesthesia) used during the test).    Do not sign any important legal documents or operate any machinery for 24 hours (because of the anesthesia used during the test).  NUTRITION  Drink plenty of fluids.   You may resume your normal diet as instructed by your doctor.   Begin with a light meal and progress to your normal diet. Heavy or fried foods are harder to digest and may make you feel sick to your stomach (nauseated).   Avoid alcoholic beverages for 24 hours or as instructed.  MEDICATIONS  You may resume your normal medications unless your doctor tells you otherwise.  WHAT YOU CAN EXPECT TODAY  Some feelings of bloating in the abdomen.   Passage of more gas than usual.   Spotting of blood in your stool or on the toilet paper.  IF YOU HAD POLYPS REMOVED DURING THE COLONOSCOPY:  No aspirin products for 7 days or as instructed.   No alcohol for 7 days or as instructed.   Eat a soft diet for the next 24 hours.  FINDING OUT THE RESULTS OF YOUR TEST Not all test results are available during your visit. If your test results are not back during the visit, make an appointment  with your caregiver to find out the results. Do not assume everything is normal if you have not heard from your caregiver or the medical facility. It is important for you to follow up on all of your test results.  SEEK IMMEDIATE MEDICAL ATTENTION IF:  You have more than a spotting of blood in your stool.   Your belly is swollen (abdominal distention).   You are nauseated or vomiting.   You have a temperature over 101.   You have abdominal pain or discomfort that is severe or gets worse throughout the day.     Hemorrhoid information provided  Schedule hemorrhoid banding in the office with me in the coming weeks. Hemorrhoid banding may improve the protruding hemorrhoid tag which you have been experiencing.  Hemorrhoids Hemorrhoids are swollen veins around the rectum or anus. There are two types of hemorrhoids:  Internal hemorrhoids. These occur in the veins just inside the rectum. They may poke through to the outside and become irritated and painful. External hemorrhoids. These occur in the veins outside the anus and can be felt as a painful swelling or hard lump near the anus. CAUSES Pregnancy.  Obesity.  Constipation or diarrhea.  Straining to have a bowel movement.  Sitting for long periods on the toilet. Heavy lifting or other activity that caused you to strain. Anal intercourse. SYMPTOMS  Pain.  Anal itching or irritation.  Rectal bleeding.  Fecal leakage.  Anal swelling.  One or  more lumps around the anus.  DIAGNOSIS  Your caregiver may be able to diagnose hemorrhoids by visual examination. Other examinations or tests that may be performed include:  Examination of the rectal area with a gloved hand (digital rectal exam).  Examination of anal canal using a small tube (scope).  A blood test if you have lost a significant amount of blood. A test to look inside the colon (sigmoidoscopy or colonoscopy). TREATMENT Most hemorrhoids can be treated at home.  However, if symptoms do not seem to be getting better or if you have a lot of rectal bleeding, your caregiver may perform a procedure to help make the hemorrhoids get smaller or remove them completely. Possible treatments include:  Placing a rubber band at the base of the hemorrhoid to cut off the circulation (rubber band ligation).  Injecting a chemical to shrink the hemorrhoid (sclerotherapy).  Using a tool to burn the hemorrhoid (infrared light therapy).  Surgically removing the hemorrhoid (hemorrhoidectomy).  Stapling the hemorrhoid to block blood flow to the tissue (hemorrhoid stapling).  HOME CARE INSTRUCTIONS  Eat foods with fiber, such as whole grains, beans, nuts, fruits, and vegetables. Ask your doctor about taking products with added fiber in them (fibersupplements). Increase fluid intake. Drink enough water and fluids to keep your urine clear or pale yellow.  Exercise regularly.  Go to the bathroom when you have the urge to have a bowel movement. Do not wait.  Avoid straining to have bowel movements.  Keep the anal area dry and clean. Use wet toilet paper or moist towelettes after a bowel movement.  Medicated creams and suppositories may be used or applied as directed.  Only take over-the-counter or prescription medicines as directed by your caregiver.  Take warm sitz baths for 15 20 minutes, 3 4 times a day to ease pain and discomfort.  Place ice packs on the hemorrhoids if they are tender and swollen. Using ice packs between sitz baths may be helpful.  Put ice in a plastic bag.  Place a towel between your skin and the bag.  Leave the ice on for 15 20 minutes, 3 4 times a day.  Do not use a donut-shaped pillow or sit on the toilet for long periods. This increases blood pooling and pain.  SEEK MEDICAL CARE IF: You have increasing pain and swelling that is not controlled by treatment or medicine. You have uncontrolled bleeding. You have difficulty or you are  unable to have a bowel movement. You have pain or inflammation outside the area of the hemorrhoids. MAKE SURE YOU: Understand these instructions. Will watch your condition. Will get help right away if you are not doing well or get worse.

## 2014-04-20 NOTE — Op Note (Signed)
[facility] [address] [place], [address]   COLONOSCOPY PROCEDURE REPORT  PATIENT: Tyler Lawrence, Tyler Lawrence  MR#:         [id number] BIRTHDATE: 1946/04/20 , 67  yrs. old GENDER: Male ENDOSCOPIST: R.  Garfield Cornea, MD FACP FACG REFERRED BY:  Kerin Perna, M.D. PROCEDURE DATE:  04/20/2014 PROCEDURE:     Ileocolonoscopy diagnostic  INDICATIONS: Hematochezia; pruitis ani; protruding rectal lesion  INFORMED CONSENT:  The risks, benefits, alternatives and imponderables including but not limited to bleeding, perforation as well as the possibility of a missed lesion have been reviewed.  The potential for biopsy, lesion removal, etc. have also been discussed.  Questions have been answered.  All parties agreeable. Please see the history and physical in the medical record for more information.  MEDICATIONS: Versed 5 mg IV and Demerol 75 mg IV in divided doses. Zofran 4 mg IV.  DESCRIPTION OF PROCEDURE:  After a digital rectal exam was performed, the EC-[id number] ([id number])  colonoscope was advanced from the anus through the rectum and colon to the area of the cecum, ileocecal valve and appendiceal orifice.  The cecum was deeply intubated.  These structures were well-seen and photographed for the record.  From the level of the cecum and ileocecal valve, the scope was slowly and cautiously withdrawn.  The mucosal surfaces were carefully surveyed utilizing scope tip deflection to facilitate fold flattening as needed.  The scope was pulled down into the rectum where a thorough examination including retroflexion was performed.    FINDINGS: Adequate preparation. External inspection of the anus demonstrates 2 somewhat fibrotic hemorrhoid tags. These do not appear to be anal papilla nor did they appear thrombosed. There were entirely nontender. They were completely reducible with digital examination. Otherwise, rectal mucosa appeared entirely normal. The colonic mucosa appeared  entirely normal there The distal 5 cm of terminal ileal mucosa also appeared normal.  THERAPEUTIC / DIAGNOSTIC MANEUVERS PERFORMED:  None  COMPLICATIONS: none  CECAL WITHDRAWAL TIME:  8 minutes  IMPRESSION:  Grade 3 hemorrhoids; otherwise normal ileocolonoscopy  RECOMMENDATIONS: Repeat colonoscopy in 10 years for screening purposes. Office visit for hemorrhoidal banding in the near future.   _______________________________ eSigned:  R. Garfield Cornea, MD FACP [facility] 04/20/2014 9:12 AM   CC:

## 2014-04-20 NOTE — Interval H&P Note (Signed)
History and Physical Interval Note:  04/20/2014 8:30 AM  Tyler Lawrence  has presented today for surgery, with the diagnosis of RECTAL BLEEDING  The various methods of treatment have been discussed with the patient and family. After consideration of risks, benefits and other options for treatment, the patient has consented to  Procedure(s) with comments: COLONOSCOPY (N/A) - 8:30 as a surgical intervention .  The patient's history has been reviewed, patient examined, no change in status, stable for surgery.  I have reviewed the patient's chart and labs.  Questions were answered to the patient's satisfaction.    No change. Colonoscopy per plan.The risks, benefits, limitations, alternatives and imponderables have been reviewed with the patient. Questions have been answered. All parties are agreeable.    Cristopher Estimable Rayonna Heldman

## 2014-04-21 ENCOUNTER — Encounter (HOSPITAL_COMMUNITY): Payer: Self-pay | Admitting: Internal Medicine

## 2014-05-01 ENCOUNTER — Other Ambulatory Visit: Payer: Self-pay | Admitting: *Deleted

## 2014-05-01 MED ORDER — RAMIPRIL 5 MG PO CAPS: 5.0000 mg | ORAL_CAPSULE | Freq: Every day | ORAL | Status: DC

## 2014-05-01 NOTE — Telephone Encounter (Signed)
Rx refill sent to patient pharmacy   

## 2014-05-08 ENCOUNTER — Telehealth: Payer: Self-pay | Admitting: Cardiovascular Disease

## 2014-05-08 MED ORDER — SIMVASTATIN 20 MG PO TABS: 20.0000 mg | ORAL_TABLET | Freq: Every day | ORAL | Status: DC

## 2014-05-08 NOTE — Telephone Encounter (Signed)
Pt need new prescription for Ramapril 5 mg #90,Simvastatin 20 mg #90. Please call to [phone].

## 2014-05-08 NOTE — Telephone Encounter (Signed)
Rx was sent to pharmacy electronically. Ramipril 5mg  was sent to pharmacy #90 with 0 refills on 05/01/14

## 2014-05-12 ENCOUNTER — Encounter: Payer: Self-pay | Admitting: Internal Medicine

## 2014-05-12 ENCOUNTER — Ambulatory Visit (INDEPENDENT_AMBULATORY_CARE_PROVIDER_SITE_OTHER): Payer: Medicare HMO | Admitting: Internal Medicine

## 2014-05-12 VITALS — BP 120/75 | HR 54 | Temp 97.5°F | Resp 20 | Ht 69.0 in | Wt 174.0 lb

## 2014-05-12 DIAGNOSIS — N521 Erectile dysfunction due to diseases classified elsewhere: Secondary | ICD-10-CM

## 2014-05-12 DIAGNOSIS — K648 Other hemorrhoids: Secondary | ICD-10-CM

## 2014-05-12 NOTE — Progress Notes (Signed)
[place] banding procedure note:  The patient presents with symptomatic hemorrhoids; ,has had multiple surgeries previously. Has a small skin tag at the anus. This was seen previously at colonoscopy. Felt to be old fibrotic hemorrhoid or an anal papilla. This aggravates him more than anything else. Discussed need to treat symptomatic hemorrhoids. Most the symptoms likely coming from internal hemorrhoids. I can issue him no guarantee that I can get rid of the skin tag with banding but it felt it is worth a try to see if we can improve his symptoms overall. Does not appear that he's taking any significant fiber supplementation.  Patient requesting rubber band ligation of his hemorrhoidal disease. All risks, benefits, and alternative forms of therapy were described and informed consent was obtained.  In the left lateral decubitus position, digital rectal exam again revealed a small external tag otherwise negative.   The decision was made to band  in the neutral position, the above-noted skin tag was easily totally reducible.; the Benson was used to perform band ligation without complication. Digital anorectal examination was then performed to assure proper positioning of the band; And found to be in excellent position. No pinching or pain after deployment. The patient was discharged home without pain or other issues. Dietary and behavioral recommendations were given.    The patient will return  2-3 weeks for followup and possible additional banding as required.  Patient requests a Viagra prescription. Has had it before -  it worked well. By description, patient was prescribed Cialis, subsequently, this did not work. . Hasn't  any side effects with  these agents previously. I see no contraindications in the medical record. We'll give him, at his request, 3-25 mg Viagra tablets. Reviewed side effects with him. Take as directed. No refills.  No complications were encountered and the patient tolerated the  procedure well.

## 2014-05-12 NOTE — Patient Instructions (Signed)
Avoid straining.  Benefiber 2 teaspoons twice daily  Take MiraLax 17 g orally at bedtime as needed for constipation  Take Viagra 25 mg daily as needed. Discussed side effects.  Limit toilet time to 2-3 minutes  Call with any interim problems  Schedule followup appointment in 2-3 weeks from now

## 2014-05-28 ENCOUNTER — Encounter: Payer: Self-pay | Admitting: Internal Medicine

## 2014-06-01 ENCOUNTER — Other Ambulatory Visit: Payer: Self-pay | Admitting: *Deleted

## 2014-06-01 MED ORDER — METOPROLOL SUCCINATE ER 50 MG PO TB24: 50.0000 mg | ORAL_TABLET | Freq: Every day | ORAL | Status: DC

## 2014-06-01 NOTE — Telephone Encounter (Signed)
Rx was sent to pharmacy electronically. 

## 2014-06-09 ENCOUNTER — Telehealth: Payer: Self-pay | Admitting: Cardiovascular Disease

## 2014-06-09 ENCOUNTER — Encounter: Payer: Medicare HMO | Admitting: Internal Medicine

## 2014-06-09 NOTE — Telephone Encounter (Signed)
Need refill on his Metoprolol 50 mg #90

## 2014-06-09 NOTE — Telephone Encounter (Signed)
Pharmacy called to request additional refills for medication, informed pharmacy that patient has not been seen in over 1 year and in order to get additional he must call and make appointment. Pharmacist stated she would call patient and let him know.

## 2014-07-14 ENCOUNTER — Other Ambulatory Visit: Payer: Self-pay | Admitting: *Deleted

## 2014-07-15 ENCOUNTER — Other Ambulatory Visit: Payer: Self-pay | Admitting: *Deleted

## 2014-08-06 ENCOUNTER — Other Ambulatory Visit: Payer: Self-pay | Admitting: *Deleted

## 2014-08-06 MED ORDER — RAMIPRIL 5 MG PO CAPS: 5.0000 mg | ORAL_CAPSULE | Freq: Every day | ORAL | Status: DC

## 2014-08-07 ENCOUNTER — Other Ambulatory Visit: Payer: Self-pay | Admitting: *Deleted

## 2014-09-02 ENCOUNTER — Telehealth: Payer: Self-pay | Admitting: Cardiovascular Disease

## 2014-09-02 MED ORDER — METOPROLOL SUCCINATE ER 50 MG PO TB24: 50.0000 mg | ORAL_TABLET | Freq: Every day | ORAL | Status: DC

## 2014-09-02 MED ORDER — RAMIPRIL 5 MG PO CAPS: 5.0000 mg | ORAL_CAPSULE | Freq: Every day | ORAL | Status: DC

## 2014-09-02 NOTE — Telephone Encounter (Signed)
Rx was sent to pharmacy electronically. appointment 11/12

## 2014-09-02 NOTE — Telephone Encounter (Signed)
Pt need new prescriptions for his Ramapril 5mg  #30 and Metoprolol 50 mg #30. Please call to [phone].He has an appt with Dr Claiborne Billings in November.

## 2014-10-01 ENCOUNTER — Encounter: Payer: Self-pay | Admitting: Cardiovascular Disease

## 2014-10-01 ENCOUNTER — Ambulatory Visit (INDEPENDENT_AMBULATORY_CARE_PROVIDER_SITE_OTHER): Payer: BC Managed Care – PPO | Admitting: Cardiovascular Disease

## 2014-10-01 VITALS — BP 132/88 | HR 54 | Ht 69.0 in | Wt 178.9 lb

## 2014-10-01 DIAGNOSIS — I2510 Atherosclerotic heart disease of native coronary artery without angina pectoris: Secondary | ICD-10-CM

## 2014-10-01 DIAGNOSIS — E782 Mixed hyperlipidemia: Secondary | ICD-10-CM

## 2014-10-01 DIAGNOSIS — I447 Left bundle-branch block, unspecified: Secondary | ICD-10-CM

## 2014-10-01 DIAGNOSIS — Z79899 Other long term (current) drug therapy: Secondary | ICD-10-CM

## 2014-10-01 DIAGNOSIS — I10 Essential (primary) hypertension: Secondary | ICD-10-CM

## 2014-10-01 DIAGNOSIS — K621 Rectal polyp: Secondary | ICD-10-CM

## 2014-10-01 MED ORDER — METOPROLOL SUCCINATE ER 50 MG PO TB24: 50.0000 mg | ORAL_TABLET | Freq: Every day | ORAL | Status: DC

## 2014-10-01 MED ORDER — RAMIPRIL 5 MG PO CAPS: 5.0000 mg | ORAL_CAPSULE | Freq: Every day | ORAL | Status: DC

## 2014-10-01 MED ORDER — SIMVASTATIN 20 MG PO TABS: 20.0000 mg | ORAL_TABLET | Freq: Every day | ORAL | Status: DC

## 2014-10-01 NOTE — Progress Notes (Signed)
Patient ID: Tyler Lawrence, male DOB: January 18, 1946, 68 y.o. MRN: [id number]         HPI: Tyler Lawrence is a 68 y.o. male who presents to the office today for a 64 month cardiology evaluation.  Tyler Lawrence has known coronary artery disease underwent CABG surgery (LIMA to the LAD, vein to the diagonal, and vein to circumflex coronary artery) in March 2001. His last cardiac catheterization was in April 2005 showed total native occlusion of his LAD but RCA was nondominant and had a 70-80% stenosis. He has a history of hyperlipidemia and has been on  therapy with simvastatin 20 mg.  His last nuclear perfusion study was on 04/04/2012 was unchanged from previously and only showed a minimal perfusion defect in the basal anterior basal anteroseptal regions consistent with proximal LAD scar. Otherwise perfusion was excellent without ischemia.  When I last saw him June 2014, he did return to work and was working part-time at [employer].  Recently, over the past year.  He is now back doing much more significant work.  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  This is much more strenuous.  He does note some mild shortness of breath.  He denies chest pressure.  He is unaware of palpitations or dizziness.  He has undergone hemorrhoidal surgery and has a rectal polyp. He will be seeing Dr. Lisabeth Register for surgical evaluation.  He has a history of chronic left bundle branch block.  He has been on ramapril-5 mg and Toprol-XL 50 mg daily for blood pressure control and for his coronary artery disease. He presents for evaluation.  Past Medical History  Diagnosis Date  . Hemorrhoid     ARE ITCHING AND UNCOMFORTABLE  . Back pain     PT STATES HE HAS FLARE UPS OF LOWER BACK AND LEFT LEG PAIN -MAYBE A LITTLE NUMBNESS--PT STATES HE WAS TOLD HE HAS A PINCHED NERVE.  Marland Kitchen Complication of anesthesia     PT STATES SURGERY AT [facility] M[facility] 2012  SPINAL ATTEMPTED FOR HEMORRHOIDECTOMY AND  HERNIA REPAIR.  PT STATES WHEN NEEDLE WAS PUT IN HIS BACK - HE FELT ELECTRICAL SHOCK DOWN LEFT GROIN AND LEFT LEG.  PT STATES THAT HE WAS THEN PUT TO  SLEEP.  . Fever blister 07/25/12    ON UPPER LIP  . Left bundle branch block     CHRONIC  . Heart palpitations   . GERD (gastroesophageal reflux disease)   . Arthritis     HANDS HURT.  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 Weakness of left arm     PT NOT SURE WHAT IS CAUSING THE WEAKNESS--HAS SEEN CHIROPRACTOR FOR NECK PAIN   . CAD (coronary artery disease)     DR. [place]; CABG 2001,HEART CATH 2005 -PATENT GRAFTS, "LUMINAL IRREGULARITY IN THE VEIN TO THE DIAGONAL VESSEL.  HIS RCA WHICH WAS NONDOMINANT, HAD 78% STENOSIS."  NUCLEAR STRESS TEST 04/04/12--"ESSENTIALLY UNCHANGED FROM PRIOR STUDY AND SHOWED A SMALL PERFUSION DEFECT IN THE BASAL ANTERIOR AND BASAL ANTEROSEPTAL REGION CONSISTENT WITH PROXIMAL LAD SCAR. "    Past Surgical History  Procedure Laterality Date  . Rt hernia repair in march of 2012      STATES HE ALSO HAD HEMORRHODECTOMY AT THE SAME TIME  . Appendectomy    . Elbow surgery      for a chipped bone  . Hemorroidectomy  MANY YEARS AGO  . Coronary artery bypass graft  01/2000  . Hemorrhoid surgery  07/26/2012    Dr. Dalbert Batman  . Colonoscopy      remote past, can't remember  . Colonoscopy N/A 04/20/2014    Dr. Gala Romney- grade 3 hemorrhoids, o/w normal ileocolonoscopy    Allergies  Allergen Reactions  . Benadryl [Diphenhydramine] Hives    Current Outpatient Prescriptions  Medication Sig Dispense Refill  . aspirin EC 81 MG tablet Take 81 mg by mouth daily.     . metoprolol succinate (TOPROL-XL) 50 MG 24 hr tablet Take 1 tablet (50 mg total) by mouth daily with breakfast. <appointment 11/12> 30 tablet 0  . Probiotic Product (PROBIOTIC DAILY PO) Take by mouth daily.    . ramipril (ALTACE) 5 MG capsule Take 1 capsule (5 mg total) by mouth daily. <appointment 11/12> 30  capsule 0  . simvastatin (ZOCOR) 20 MG tablet Take 1 tablet (20 mg total) by mouth at bedtime. <please schedule appointment for future refills> 90 tablet 0   No current facility-administered medications for this visit.    Social he is married has 2 children 4 grandchildren. He quit smoking in 1991. There is no alcohol use. He stays busy at work but does not routinely exercise.  ROS is negative for fever chills night sweats. He denies presyncope or syncope. He denies rashes. He does notice irritation in the midsternal region which can at times vary in size. He denies any exertional type chest pain. He denies wheezing. He denies palpitations. He denies abdominal pain. He there is a remote history of rectal bleeding. He is status post hemorrhoidectomy. He denies claudication. He denies paresthesias. Other system review is negative.   PE BP 132/88 mmHg  Pulse 54  Ht 5\' 9"  (1.753 m)  Wt 178 lb 14.4 oz (81.149 kg)  BMI 26.41 kg/m2  General: Alert, oriented, no distress.  HEENT: Normocephalic, atraumatic. Pupils round and reactive; sclera anicteric;no lid lag,  Nose without nasal septal hypertrophy Mouth/Parynx benign; Mallinpatti scale  Neck: No JVD, no carotid bruits with normal carotid upstroke Lungs: clear to ausculatation and percussion; no wheezing or rales Chest wall: nontender to palpation Heart: RRR, s1 s2 normal  1/6 systolic murmur; .  No diastolic murmur.  No rubs thrills or heaves  Abdomen: soft, nontender; no hepatosplenomehaly, BS+; abdominal aorta nontender and not dilated by palpation. Back: No CVA tenderness Pulses 2+ Extremities: no clubbing cyanosis or edema, Homan's sign negative  Neurologic: grossly nonfocal Psychological: Normal affect and mood  ECG (independently read by me): Sinus bradycardia at 54 bpm.  Left bundle branch block.  Poor R-wave progression.  June 2014 ECG: Normal sinus rhythm with left bundle branch block which is old.   LABS:  BMET    Component  Value Date/Time   NA 139 03/27/2014 0831   K 4.3 03/27/2014 0831   CL 106 03/27/2014 0831   CO2 26 03/27/2014 0831   GLUCOSE 100* 03/27/2014 0831   BUN 10 03/27/2014 0831   CREATININE 0.89 03/27/2014 0831   CREATININE 0.89 07/25/2012 1055   CALCIUM 9.3 03/27/2014 0831   GFRNONAA 88* 07/25/2012 1055   GFRAA >90 07/25/2012 1055     Hepatic Function Panel     Component Value Date/Time   PROT 6.3 03/27/2014 0831     CBC    Component Value Date/Time   WBC 6.9 05/07/2013 0840   RBC 4.96 05/07/2013 0840   HGB 15.4 05/07/2013 0840   HCT 43.4 05/07/2013 0840  PLT 165 05/07/2013 0840   MCV 87.5 05/07/2013 0840   MCH 31.0 05/07/2013 0840   MCHC 35.5 05/07/2013 0840   RDW 13.3 05/07/2013 0840     BNP No results found for: PROBNP  Lipid Panel     Component Value Date/Time   CHOL 110 03/27/2014 0831     RADIOLOGY: No results found.    ASSESSMENT AND PLAN: Tyler Lawrence  is now 68 yrs old and 14 years following his CABG revascularization surgery.  From a cardiac standpoint he has been without anginal symptomatology or CHF symptomatology. 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  He successfully underwent band ligation of his hemorrhoids.  He now is bothered by a painful polyp which may need removal.  Presently, his blood pressure is controlled on ramipril 5 mg and metoprolol 50 mg.  He is chronic left bundle branch block and this is stable.  He is bradycardic with heart rates in the 50s but is asymptomatic without dizziness. Presently, he will continue his current medical regimen.  I have recommended that he undergo a Lexiscan Myoview study in 6 months, which will be 3 years from his last study of 04/04/2012. A complete set of blood work will be obtained for continued aggressive therapy and medication surveillance.  I will see him in follow-up of his nuclear  perfusion study and further recommendations will be made at that time.  Time spent: 25 minutes   Troy Sine, MD, [facility]  10/01/2014 11:26 AM

## 2014-10-01 NOTE — Patient Instructions (Addendum)
Your physician wants you to follow-up in: 6 months or sooner with Dr. Claiborne Billings.You will receive a reminder letter in the mail two months in advance. If you don't receive a letter, please call our office to schedule the follow-up appointment.  Your physician has requested that you have a lexiscan myoview. For further information please visit [email or web address]. Please follow instruction sheet, as given. This will be done in may 2016.  Your physician recommends that you return for lab work prior to your 6 month follow up appointment. A letter and lab orders will be sent to you.

## 2014-12-01 DIAGNOSIS — E663 Overweight: Secondary | ICD-10-CM | POA: Diagnosis not present

## 2014-12-01 DIAGNOSIS — T464X5A Adverse effect of angiotensin-converting-enzyme inhibitors, initial encounter: Secondary | ICD-10-CM | POA: Diagnosis not present

## 2014-12-01 DIAGNOSIS — J069 Acute upper respiratory infection, unspecified: Secondary | ICD-10-CM | POA: Diagnosis not present

## 2014-12-01 DIAGNOSIS — Z6825 Body mass index (BMI) 25.0-25.9, adult: Secondary | ICD-10-CM | POA: Diagnosis not present

## 2015-01-12 DIAGNOSIS — J019 Acute sinusitis, unspecified: Secondary | ICD-10-CM | POA: Diagnosis not present

## 2015-01-12 DIAGNOSIS — E663 Overweight: Secondary | ICD-10-CM | POA: Diagnosis not present

## 2015-01-12 DIAGNOSIS — M179 Osteoarthritis of knee, unspecified: Secondary | ICD-10-CM | POA: Diagnosis not present

## 2015-01-12 DIAGNOSIS — Z6825 Body mass index (BMI) 25.0-25.9, adult: Secondary | ICD-10-CM | POA: Diagnosis not present

## 2015-02-10 ENCOUNTER — Other Ambulatory Visit: Payer: Self-pay | Admitting: General Surgery

## 2015-02-10 DIAGNOSIS — I447 Left bundle-branch block, unspecified: Secondary | ICD-10-CM | POA: Diagnosis not present

## 2015-02-10 DIAGNOSIS — Z9049 Acquired absence of other specified parts of digestive tract: Secondary | ICD-10-CM | POA: Diagnosis not present

## 2015-02-10 DIAGNOSIS — Z9889 Other specified postprocedural states: Secondary | ICD-10-CM | POA: Diagnosis not present

## 2015-02-10 DIAGNOSIS — L290 Pruritus ani: Secondary | ICD-10-CM | POA: Diagnosis not present

## 2015-02-10 DIAGNOSIS — Z951 Presence of aortocoronary bypass graft: Secondary | ICD-10-CM | POA: Diagnosis not present

## 2015-02-10 DIAGNOSIS — K644 Residual hemorrhoidal skin tags: Secondary | ICD-10-CM | POA: Diagnosis not present

## 2015-02-22 ENCOUNTER — Other Ambulatory Visit: Payer: Self-pay | Admitting: *Deleted

## 2015-02-22 ENCOUNTER — Telehealth: Payer: Self-pay | Admitting: *Deleted

## 2015-02-22 DIAGNOSIS — Z01818 Encounter for other preprocedural examination: Secondary | ICD-10-CM

## 2015-02-22 DIAGNOSIS — I25812 Atherosclerosis of bypass graft of coronary artery of transplanted heart without angina pectoris: Secondary | ICD-10-CM

## 2015-02-22 NOTE — Telephone Encounter (Signed)
Faxed note to [facility] [place] [facility] that patient will need lexiscan and office appointment before he can be cleared for hemorrhoid surgery.

## 2015-03-15 ENCOUNTER — Other Ambulatory Visit: Payer: Self-pay | Admitting: *Deleted

## 2015-03-15 ENCOUNTER — Encounter: Payer: Self-pay | Admitting: *Deleted

## 2015-03-15 DIAGNOSIS — Z79899 Other long term (current) drug therapy: Secondary | ICD-10-CM

## 2015-03-15 DIAGNOSIS — I2510 Atherosclerotic heart disease of native coronary artery without angina pectoris: Secondary | ICD-10-CM

## 2015-03-15 DIAGNOSIS — I10 Essential (primary) hypertension: Secondary | ICD-10-CM

## 2015-03-15 DIAGNOSIS — E782 Mixed hyperlipidemia: Secondary | ICD-10-CM

## 2015-03-23 DIAGNOSIS — I2510 Atherosclerotic heart disease of native coronary artery without angina pectoris: Secondary | ICD-10-CM | POA: Diagnosis not present

## 2015-03-23 DIAGNOSIS — E782 Mixed hyperlipidemia: Secondary | ICD-10-CM | POA: Diagnosis not present

## 2015-03-23 DIAGNOSIS — I10 Essential (primary) hypertension: Secondary | ICD-10-CM | POA: Diagnosis not present

## 2015-03-23 DIAGNOSIS — Z79899 Other long term (current) drug therapy: Secondary | ICD-10-CM | POA: Diagnosis not present

## 2015-03-25 ENCOUNTER — Other Ambulatory Visit: Payer: Self-pay

## 2015-03-25 ENCOUNTER — Encounter: Payer: Self-pay | Admitting: Cardiovascular Disease

## 2015-03-26 MED ORDER — HYDROCORTISONE 2.5 % RE CREA: 1.0000 "application " | TOPICAL_CREAM | Freq: Two times a day (BID) | RECTAL | Status: DC

## 2015-04-01 ENCOUNTER — Telehealth (HOSPITAL_COMMUNITY): Payer: Self-pay

## 2015-04-01 ENCOUNTER — Other Ambulatory Visit: Payer: Self-pay

## 2015-04-01 NOTE — Telephone Encounter (Signed)
Encounter complete. 

## 2015-04-02 MED ORDER — HYDROCORTISONE 2.5 % RE CREA: 1.0000 "application " | TOPICAL_CREAM | Freq: Two times a day (BID) | RECTAL | Status: DC

## 2015-04-06 ENCOUNTER — Ambulatory Visit (HOSPITAL_COMMUNITY)
Admission: RE | Admit: 2015-04-06 | Discharge: 2015-04-06 | Disposition: A | Discharge status: Home / Self Care | Payer: BLUE CROSS/BLUE SHIELD | Source: Ambulatory Visit | Attending: Cardiovascular Disease | Admitting: Cardiovascular Disease

## 2015-04-06 ENCOUNTER — Other Ambulatory Visit: Payer: Self-pay | Admitting: Cardiovascular Disease

## 2015-04-06 DIAGNOSIS — I10 Essential (primary) hypertension: Secondary | ICD-10-CM | POA: Diagnosis not present

## 2015-04-06 DIAGNOSIS — D3132 Benign neoplasm of left choroid: Secondary | ICD-10-CM | POA: Diagnosis not present

## 2015-04-06 DIAGNOSIS — E782 Mixed hyperlipidemia: Secondary | ICD-10-CM | POA: Diagnosis not present

## 2015-04-06 DIAGNOSIS — I2510 Atherosclerotic heart disease of native coronary artery without angina pectoris: Secondary | ICD-10-CM | POA: Diagnosis not present

## 2015-04-06 DIAGNOSIS — Z79899 Other long term (current) drug therapy: Secondary | ICD-10-CM

## 2015-04-06 LAB — MYOCARDIAL PERFUSION IMAGING
LV dias vol: 127 mL
LVSYSVOL: 63 mL
NUC STRESS TID: 1.23
Nuc Stress EF: 50 %
Peak HR: 91 {beats}/min
Rest HR: 54 {beats}/min
SDS: 1
SRS: 3
SSS: 4

## 2015-04-06 MED ORDER — TECHNETIUM TC 99M SESTAMIBI GENERIC - CARDIOLITE
10.5000 | Freq: Once | INTRAVENOUS | Status: AC | PRN
  Administered 2015-04-06: 11 via INTRAVENOUS

## 2015-04-06 MED ORDER — AMINOPHYLLINE 25 MG/ML IV SOLN
75.0000 mg | Freq: Once | INTRAVENOUS | Status: AC
  Administered 2015-04-06: 75 mg via INTRAVENOUS

## 2015-04-06 MED ORDER — REGADENOSON 0.4 MG/5ML IV SOLN
0.4000 mg | Freq: Once | INTRAVENOUS | Status: AC
  Administered 2015-04-06: 0.4 mg via INTRAVENOUS

## 2015-04-06 MED ORDER — TECHNETIUM TC 99M SESTAMIBI GENERIC - CARDIOLITE
31.0000 | Freq: Once | INTRAVENOUS | Status: AC | PRN
  Administered 2015-04-06: 31 via INTRAVENOUS

## 2015-04-08 ENCOUNTER — Encounter: Payer: Self-pay | Admitting: *Deleted

## 2015-04-08 ENCOUNTER — Ambulatory Visit: Payer: Medicare Other | Admitting: Gastroenterology

## 2015-04-09 ENCOUNTER — Telehealth: Payer: Self-pay | Admitting: *Deleted

## 2015-04-09 NOTE — Telephone Encounter (Signed)
Called patient to notify him of nuclear study results reported by Dr. Percival Spanish in Dr. Evette Georges absence. Patient voiced understanding and informs me that he needs surgical clearance sent to Dr. Dalbert Batman to have hemorrhoid surgery. The procedure has not been scheduled yet pending clearance. Message will be sent to Dr. Claiborne Billings for review and recommendartions.

## 2015-04-15 ENCOUNTER — Ambulatory Visit: Payer: Medicare Other | Admitting: Nurse Practitioner

## 2015-04-19 NOTE — Telephone Encounter (Signed)
Low risk nuc with LBBB artifact; ok for surgery

## 2015-04-23 ENCOUNTER — Encounter: Payer: Self-pay | Admitting: *Deleted

## 2015-04-23 NOTE — Telephone Encounter (Signed)
Clearance letter dent to Dr. Fanny Skates via epic.

## 2015-05-14 ENCOUNTER — Telehealth: Payer: Self-pay | Admitting: Cardiovascular Disease

## 2015-05-14 NOTE — Telephone Encounter (Signed)
°  1. Which medications need to be refilled? Losartan-Please call today,completely out of it.  2. Which pharmacy is medication to be sent to?[phone] 3. Do they need a 30 day or 90 day supply? 90 and refills  4. Would they like a call back once the medication has been sent to the pharmacy? yes

## 2015-05-14 NOTE — Telephone Encounter (Signed)
Spoke with pt wife, losartan is not on his medication list at our office. She reports dr Armandina Gemma changed the patient to this medication. Wife reports she has not called dr Jake Shark, they do not answer the phone. Advised to call dr Armandina Gemma since he made the change and we do not have a record of that. Patient wife voiced understanding  Wife also made aware patient needs a f/u appt with dr Claiborne Billings

## 2015-06-09 NOTE — Progress Notes (Signed)
Please put orders in Epic surgery 06-17-15 pre op 06-11-15 Thanks

## 2015-06-10 ENCOUNTER — Other Ambulatory Visit: Payer: Self-pay | Admitting: General Surgery

## 2015-06-11 ENCOUNTER — Encounter (HOSPITAL_COMMUNITY): Payer: Self-pay

## 2015-06-11 ENCOUNTER — Encounter (HOSPITAL_COMMUNITY)
Admission: RE | Admit: 2015-06-11 | Discharge: 2015-06-11 | Disposition: A | Discharge status: Home / Self Care | Payer: BLUE CROSS/BLUE SHIELD | Source: Ambulatory Visit | Attending: General Surgery | Admitting: General Surgery

## 2015-06-11 DIAGNOSIS — Z01812 Encounter for preprocedural laboratory examination: Secondary | ICD-10-CM | POA: Diagnosis not present

## 2015-06-11 DIAGNOSIS — K644 Residual hemorrhoidal skin tags: Secondary | ICD-10-CM | POA: Diagnosis not present

## 2015-06-11 DIAGNOSIS — K648 Other hemorrhoids: Secondary | ICD-10-CM | POA: Insufficient documentation

## 2015-06-11 HISTORY — DX: Essential (primary) hypertension: I10

## 2015-06-11 LAB — COMPREHENSIVE METABOLIC PANEL
ALBUMIN: 3.7 g/dL (ref 3.5–5.0)
ALT: 18 U/L (ref 17–63)
ANION GAP: 6 (ref 5–15)
AST: 20 U/L (ref 15–41)
Alkaline Phosphatase: 65 U/L (ref 38–126)
BILIRUBIN TOTAL: 0.7 mg/dL (ref 0.3–1.2)
BUN: 14 mg/dL (ref 6–20)
CO2: 26 mmol/L (ref 22–32)
Calcium: 8.7 mg/dL — ABNORMAL LOW (ref 8.9–10.3)
Chloride: 107 mmol/L (ref 101–111)
Creatinine, Ser: 0.83 mg/dL (ref 0.61–1.24)
GFR calc Af Amer: >60 mL/min (ref >60)
GLUCOSE: 117 mg/dL — AB (ref 65–99)
Potassium: 3.9 mmol/L (ref 3.5–5.1)
SODIUM: 139 mmol/L (ref 135–145)
TOTAL PROTEIN: 6.2 g/dL — AB (ref 6.5–8.1)

## 2015-06-11 LAB — CBC WITH DIFFERENTIAL/PLATELET
Basophils Absolute: 0.1 10*3/uL (ref 0.0–0.1)
Basophils Relative: 1 % (ref 0–1)
EOS PCT: 5 % (ref 0–5)
Eosinophils Absolute: 0.4 10*3/uL (ref 0.0–0.7)
HEMATOCRIT: 39.8 % (ref 39.0–52.0)
Hemoglobin: 13.7 g/dL (ref 13.0–17.0)
Lymphocytes Relative: 37 % (ref 12–46)
Lymphs Abs: 3.2 10*3/uL (ref 0.7–4.0)
MCH: 30.9 pg (ref 26.0–34.0)
MCHC: 34.4 g/dL (ref 30.0–36.0)
MCV: 89.8 fL (ref 78.0–100.0)
MONO ABS: 0.5 10*3/uL (ref 0.1–1.0)
MONOS PCT: 5 % (ref 3–12)
NEUTROS ABS: 4.5 10*3/uL (ref 1.7–7.7)
Neutrophils Relative %: 52 % (ref 43–77)
Platelets: 178 10*3/uL (ref 150–400)
RBC: 4.43 MIL/uL (ref 4.22–5.81)
RDW: 12.2 % (ref 11.5–15.5)
WBC: 8.7 10*3/uL (ref 4.0–10.5)

## 2015-06-11 NOTE — Progress Notes (Addendum)
EKG-10/01/2014 EPIC  Stress Test 04/06/2015 - EPIC  10/01/2014- LOV with Dr Corky Downs - EPIC  Clearance in telephone note - 04/09/15 EPIC

## 2015-06-11 NOTE — Patient Instructions (Addendum)
JONATHYN CAROTHERS  06/11/2015   Your procedure is scheduled on:   06/18/2015    Report to [facility] Main  Entrance take [place]  elevators to 3rd floor to  [place] [facility] [place]    AM.  Call this number if you have problems the morning of surgery [phone]   Remember: ONLY 1 PERSON MAY GO WITH YOU TO SHORT STAY TO GET  READY MORNING OF [place].  Do not eat food or drink liquids :After Midnight.     Take these medicines the morning of surgery with A SIP OF WATER: none                                You may not have any metal on your body including hair pins and              piercings  Do not wear jewelry,  lotions, powders or perfumes, deodorant                        Men may shave face and neck.   Do not bring valuables to the hospital. [place].  Contacts, dentures or bridgework may not be worn into surgery.      Patients discharged the day of surgery will not be allowed to drive home.  Name and phone number of your driver:  Special Instructions: coughing and deep breathing exercises, leg exercises               Please read over the following fact sheets you were given: _____________________________________________________________________             [facility] - Preparing for Surgery Before surgery, you can play an important role.  Because skin is not sterile, your skin needs to be as free of germs as possible.  You can reduce the number of germs on your skin by washing with CHG (chlorahexidine gluconate) soap before surgery.  CHG is an antiseptic cleaner which kills germs and bonds with the skin to continue killing germs even after washing. Please DO NOT use if you have an allergy to CHG or antibacterial soaps.  If your skin becomes reddened/irritated stop using the CHG and inform your nurse when you arrive at Short Stay. Do not shave (including legs and underarms) for at least 48 hours prior to  the first CHG shower.  You may shave your face/neck. Please follow these instructions carefully:  1.  Shower with CHG Soap the night before surgery and the  morning of Surgery.  2.  If you choose to wash your hair, wash your hair first as usual with your  normal  shampoo.  3.  After you shampoo, rinse your hair and body thoroughly to remove the  shampoo.                           4.  Use CHG as you would any other liquid soap.  You can apply chg directly  to the skin and wash                       Gently with a scrungie or clean washcloth.  5.  Apply the CHG Soap to your body ONLY FROM THE NECK DOWN.   Do not use on face/ open                           Wound or open sores. Avoid contact with eyes, ears mouth and genitals (private parts).                       Wash face,  Genitals (private parts) with your normal soap.             6.  Wash thoroughly, paying special attention to the area where your surgery  will be performed.  7.  Thoroughly rinse your body with warm water from the neck down.  8.  DO NOT shower/wash with your normal soap after using and rinsing off  the CHG Soap.                9.  Pat yourself dry with a clean towel.            10.  Wear clean pajamas.            11.  Place clean sheets on your bed the night of your first shower and do not  sleep with pets. Day of Surgery : Do not apply any lotions/deodorants the morning of surgery.  Please wear clean clothes to the hospital/surgery center.  FAILURE TO FOLLOW THESE INSTRUCTIONS MAY RESULT IN THE CANCELLATION OF YOUR SURGERY PATIENT SIGNATURE_________________________________  NURSE SIGNATURE__________________________________  ________________________________________________________________________

## 2015-06-16 NOTE — Anesthesia Preprocedure Evaluation (Signed)
Anesthesia Evaluation  Patient identified by MRN, date of birth, ID band Patient awake    Reviewed: Allergy & Precautions, NPO status , Patient's Chart, lab work & pertinent test results  History of Anesthesia Complications Negative for: history of anesthetic complications  Airway Mallampati: II  TM Distance: >3 FB Neck ROM: Full    Dental no notable dental hx. (+) Dental Advisory Given   Pulmonary former smoker,  breath sounds clear to auscultation  Pulmonary exam normal       Cardiovascular hypertension, Pt. on medications + CAD and + CABG Normal cardiovascular exam+ dysrhythmias (LBBB) Rhythm:Regular Rate:Normal  Cleared by cardiology for this surgery. Recent nuclear stress 5/16: low risk, EF 50%, LBBB    Neuro/Psych negative neurological ROS  negative psych ROS   GI/Hepatic Neg liver ROS, GERD-  Medicated and Controlled,  Endo/Other  negative endocrine ROS  Renal/GU negative Renal ROS  negative genitourinary   Musculoskeletal  (+) Arthritis -, Osteoarthritis,    Abdominal   Peds negative pediatric ROS (+)  Hematology negative hematology ROS (+)   Anesthesia Other Findings   Reproductive/Obstetrics negative OB ROS                             Anesthesia Physical Anesthesia Plan  ASA: II  Anesthesia Plan: General   Post-op Pain Management:    Induction: Intravenous  Airway Management Planned: Oral ETT  Additional Equipment:   Intra-op Plan:   Post-operative Plan: Extubation in OR  Informed Consent: I have reviewed the patients History and Physical, chart, labs and discussed the procedure including the risks, benefits and alternatives for the proposed anesthesia with the patient or authorized representative who has indicated his/her understanding and acceptance.   Dental advisory given  Plan Discussed with: CRNA  Anesthesia Plan Comments:         Anesthesia Quick  Evaluation

## 2015-06-16 NOTE — H&P (Signed)
Tyler Lawrence. [place]  Location: [facility] Patient #: [id number] DOB: [birth year] Married / Language: English / Race: White Male        History of Present Illness    The patient is a 69 year old male who presents with hemorrhoids. He returns to discuss his anorectal problems. Past history is significant for internal and external hemorrhoidectomy, 3 columns, by me on July 26, 2012. He had an excellent result and was very pleased for a long time. 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 He doesn't have any bleeding. The has lots of itching and irritation and analysis to have something done. He put off the surgery last time because he did not have insurance. He now has a better job with benefits. Last colonoscopy was last fall with Dr. Gala Lawrence and he said that nothing bad was found.  Comorbidities include coronary artery disease, coronary artery bypass grafting the past, told him that he may have some internal hemorrhoids that might have to be taken care of as well. He will be scheduled for internal and external hemorrhoidectomy, examination under anesthesia as an outpatient. I discussed the indications, details, techniques, and numerous risk of the surgery with him. He is aware the risk of bleeding, infection, recurrence of hemorrhoids, sphincter damage, cardiac pulmonary and thromboembolic problems. He understands these issues well. At this time his questions are answered. He agrees with this plan.  We will ask Dr. Shelva Lawrence for a preop cardiac risk assessment.   Other Problems Arthritis Back Pain Gastroesophageal Reflux Disease Hemorrhoids Hypercholesterolemia Kidney Stone  Past Surgical History Coronary Artery Bypass Graft  Diagnostic Studies History  Colonoscopy 1-5 years ago  Allergies  No Known Drug Allergies03/23/2016  Medication  History  Simvastatin (20MG  Tablet, Oral daily) Active. Metoprolol Succinate ER (50MG  Tablet ER 24HR, Oral daily) Active. Losartan Potassium (100MG  Tablet, Oral daily) Active. Aspirin EC (81MG  Tablet DR, Oral daily) Active. MiraLax (Oral as needed) Active. Probiotic (Oral daily) Active. Glucosamine Chondr 500 Complex (Oral daily) Active.  Social History  Caffeine use Coffee, Tea. No alcohol use No drug use Tobacco use Former smoker.  Family History Arthritis Brother, Father, Mother, Sister. Heart Disease Father. Heart disease in male family member before age 1  Review of System General Not Present- Appetite Loss, Chills, Fatigue, Fever, Night Sweats, Weight Gain and Weight Loss. Skin Not Present- Change in Wart/Mole, Dryness, Hives, Jaundice, New Lesions, Non-Healing Wounds, Rash and Ulcer. HEENT Present- Ringing in the Ears and Seasonal Allergies. Not Present- Earache, Hearing Loss, Hoarseness, Nose Bleed, Oral Ulcers, Sinus Pain, Sore Throat, Visual Disturbances, Wears glasses/contact lenses and Yellow Eyes. Respiratory Not Present- Bloody sputum, Chronic Cough, Difficulty Breathing, Snoring and Wheezing. Cardiovascular Not Present- Chest Pain, Difficulty Breathing Lying Down, Leg Cramps, Palpitations, Rapid Heart Rate, Shortness of Breath and Swelling of Extremities. Gastrointestinal Present- Rectal Pain. Not Present- Abdominal Pain, Bloating, Bloody Stool, Change in Bowel Habits, Chronic diarrhea, Constipation, Difficulty Swallowing, Excessive gas, Gets full quickly at meals, Hemorrhoids, Indigestion, Nausea and Vomiting. Male Genitourinary Not Present- Blood in Urine, Change in Urinary Stream, Frequency, Impotence, Nocturia, Painful Urination, Urgency and Urine Leakage. Musculoskeletal Present- Joint Stiffness. Not Present- Back Pain, Joint Pain, Muscle Pain, Muscle Weakness and Swelling of Extremities. Neurological Not Present- Decreased Memory, Fainting, Headaches,  Numbness, Seizures, Tingling, Tremor, Trouble walking and Weakness. Psychiatric Not Present- Anxiety, Bipolar, Change in Sleep Pattern, Depression, Fearful and  Frequent crying. Endocrine Not Present- Cold Intolerance, Excessive Hunger, Hair Changes, Heat Intolerance, Hot flashes and New Diabetes. Hematology Present- Easy Bruising. Not Present- Excessive bleeding, Gland problems, HIV and Persistent Infections.   Vitals   Weight: 169.5 lb Height: 69in Body Surface Area: 1.93 m Body Mass Index: 25.03 kg/m Temp.: 99.22F(Oral)  Pulse: 74 (Regular)  BP: 130/70 (Sitting, Left Arm, Standard)    Physical Exam Renelda Loma M. Dalbert Batman MD; 02/10/2015 2:37 PM) General Mental Status-Alert. General Appearance-Consistent with stated age. Hydration-Well hydrated. Voice-Normal.  Head and Neck Head-normocephalic, atraumatic with no lesions or palpable masses. Trachea-midline. Thyroid Gland Characteristics - normal size and consistency.  Eye Eyeball - Bilateral-Extraocular movements intact. Sclera/Conjunctiva - Bilateral-No scleral icterus.  Chest and Lung Exam Chest and lung exam reveals -quiet, even and easy respiratory effort with no use of accessory muscles and on auscultation, normal breath sounds, no adventitious sounds and normal vocal resonance. Inspection Chest Wall - Normal. Back - normal. Note: Well-healed sternotomy scar.   Cardiovascular Cardiovascular examination reveals -normal heart sounds, regular rate and rhythm with no murmurs and normal pedal pulses bilaterally.  Abdomen Inspection Inspection of the abdomen reveals - No Hernias. Palpation/Percussion Palpation and Percussion of the abdomen reveal - Soft, Non Tender, No Rebound tenderness, No Rigidity (guarding) and No hepatosplenomegaly. Auscultation Auscultation of the abdomen reveals - Bowel sounds normal. Note: Laparoscopic scars from appendectomy.   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 Tolerated digital rectal exam. No stenosis. He may have some internal hemorrhoids.   Neurologic Neurologic evaluation reveals -alert and oriented x 3 with no impairment of recent or remote memory. Mental Status-Normal.  Musculoskeletal Normal Exam - Left-Upper Extremity Strength Normal and Lower Extremity Strength Normal. Normal Exam - Right-Upper Extremity Strength Normal and Lower Extremity Strength Normal.  Lymphatic Head & Neck  General Head & Neck Lymphatics: Bilateral - Description - Normal. Axillary  General Axillary Region: Bilateral - Description - Normal. Tenderness - Non Tender. Femoral & Inguinal  Generalized Femoral & Inguinal Lymphatics: Bilateral - Description - Normal. Tenderness - Non Tender.    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 Shelva Lawrence preop. Be sure to take the rectal prep preop as instructed.   S/P CABG (CORONARY ARTERY BYPASS GRAFT) (V45.81  Z95.1)  PRURITUS ANI (698.0  L29.0)  LBBB (LEFT BUNDLE BRANCH BLOCK) (426.3  I44.7)  HISTORY OF APPENDECTOMY (V45.89  Z90.49) HISTORY OF HEMORRHOIDECTOMY (V45.89  Z98.89)    Christene Pounds M. Dalbert Batman, M.D., [facility] General and Minimally invasive Surgery Breast and Colorectal Surgery Office:   [phone] Pager:   [phone]

## 2015-06-17 ENCOUNTER — Encounter: Payer: Self-pay | Admitting: *Deleted

## 2015-06-18 ENCOUNTER — Ambulatory Visit (HOSPITAL_COMMUNITY)
Admission: RE | Admit: 2015-06-18 | Discharge: 2015-06-18 | Disposition: A | Discharge status: Home / Self Care | Payer: BLUE CROSS/BLUE SHIELD | Source: Ambulatory Visit | Attending: General Surgery | Admitting: General Surgery

## 2015-06-18 ENCOUNTER — Encounter (HOSPITAL_COMMUNITY): Payer: Self-pay | Admitting: *Deleted

## 2015-06-18 ENCOUNTER — Encounter (HOSPITAL_COMMUNITY)
Admission: RE | Disposition: A | Discharge status: Home / Self Care | Payer: Self-pay | Source: Ambulatory Visit | Attending: General Surgery

## 2015-06-18 ENCOUNTER — Ambulatory Visit (HOSPITAL_COMMUNITY): Payer: BLUE CROSS/BLUE SHIELD | Admitting: Anesthesiology

## 2015-06-18 DIAGNOSIS — K644 Residual hemorrhoidal skin tags: Secondary | ICD-10-CM | POA: Diagnosis present

## 2015-06-18 DIAGNOSIS — Z951 Presence of aortocoronary bypass graft: Secondary | ICD-10-CM | POA: Insufficient documentation

## 2015-06-18 DIAGNOSIS — K649 Unspecified hemorrhoids: Secondary | ICD-10-CM | POA: Diagnosis present

## 2015-06-18 DIAGNOSIS — K648 Other hemorrhoids: Secondary | ICD-10-CM | POA: Diagnosis not present

## 2015-06-18 DIAGNOSIS — I2510 Atherosclerotic heart disease of native coronary artery without angina pectoris: Secondary | ICD-10-CM | POA: Insufficient documentation

## 2015-06-18 DIAGNOSIS — K621 Rectal polyp: Secondary | ICD-10-CM | POA: Diagnosis not present

## 2015-06-18 HISTORY — PX: HEMORRHOID SURGERY: SHX153

## 2015-06-18 SURGERY — HEMORRHOIDECTOMY
Anesthesia: General | Site: Rectum

## 2015-06-18 MED ORDER — FENTANYL CITRATE (PF) 100 MCG/2ML IJ SOLN: 25.0000 ug | INTRAMUSCULAR | Status: DC | PRN

## 2015-06-18 MED ORDER — ACETAMINOPHEN 650 MG RE SUPP
650.0000 mg | RECTAL | Status: DC | PRN
  Filled 2015-06-18: qty 1

## 2015-06-18 MED ORDER — LACTATED RINGERS IV SOLN: INTRAVENOUS | Status: DC

## 2015-06-18 MED ORDER — SODIUM CHLORIDE 0.9 % IJ SOLN: 3.0000 mL | INTRAMUSCULAR | Status: DC | PRN

## 2015-06-18 MED ORDER — OXYCODONE HCL 5 MG PO TABS: 5.0000 mg | ORAL_TABLET | ORAL | Status: DC | PRN

## 2015-06-18 MED ORDER — CHLORHEXIDINE GLUCONATE 4 % EX LIQD: 1.0000 "application " | Freq: Once | CUTANEOUS | Status: DC

## 2015-06-18 MED ORDER — PROPOFOL 10 MG/ML IV BOLUS
INTRAVENOUS | Status: AC
  Filled 2015-06-18: qty 20

## 2015-06-18 MED ORDER — ACETAMINOPHEN 325 MG PO TABS: 650.0000 mg | ORAL_TABLET | ORAL | Status: DC | PRN

## 2015-06-18 MED ORDER — LIDOCAINE HCL (CARDIAC) 20 MG/ML IV SOLN
INTRAVENOUS | Status: AC
  Filled 2015-06-18: qty 5

## 2015-06-18 MED ORDER — LACTATED RINGERS IV SOLN
INTRAVENOUS | Status: DC | PRN
  Administered 2015-06-18: 07:00:00 via INTRAVENOUS

## 2015-06-18 MED ORDER — SODIUM CHLORIDE 0.9 % IV SOLN: INTRAVENOUS | Status: DC

## 2015-06-18 MED ORDER — BUPIVACAINE LIPOSOME 1.3 % IJ SUSP
20.0000 mL | Freq: Once | INTRAMUSCULAR | Status: AC
  Administered 2015-06-18: 18 mL
  Filled 2015-06-18: qty 20

## 2015-06-18 MED ORDER — MIDAZOLAM HCL 2 MG/2ML IJ SOLN
INTRAMUSCULAR | Status: AC
  Filled 2015-06-18: qty 2

## 2015-06-18 MED ORDER — PROPOFOL 10 MG/ML IV BOLUS
INTRAVENOUS | Status: DC | PRN
  Administered 2015-06-18: 130 mg via INTRAVENOUS

## 2015-06-18 MED ORDER — SODIUM CHLORIDE 0.9 % IJ SOLN: 3.0000 mL | Freq: Two times a day (BID) | INTRAMUSCULAR | Status: DC

## 2015-06-18 MED ORDER — HYDROCODONE-ACETAMINOPHEN 5-325 MG PO TABS: 1.0000 | ORAL_TABLET | Freq: Four times a day (QID) | ORAL | Status: DC | PRN

## 2015-06-18 MED ORDER — DEXAMETHASONE SODIUM PHOSPHATE 10 MG/ML IJ SOLN
INTRAMUSCULAR | Status: DC | PRN
  Administered 2015-06-18: 10 mg via INTRAVENOUS

## 2015-06-18 MED ORDER — 0.9 % SODIUM CHLORIDE (POUR BTL) OPTIME
TOPICAL | Status: DC | PRN
  Administered 2015-06-18: 1000 mL

## 2015-06-18 MED ORDER — FENTANYL CITRATE (PF) 100 MCG/2ML IJ SOLN
INTRAMUSCULAR | Status: DC | PRN
  Administered 2015-06-18: 100 ug via INTRAVENOUS

## 2015-06-18 MED ORDER — DEXTROSE 5 % IV SOLN
2.0000 g | INTRAVENOUS | Status: AC
  Administered 2015-06-18: 2 g via INTRAVENOUS

## 2015-06-18 MED ORDER — BUPIVACAINE-EPINEPHRINE (PF) 0.5% -1:200000 IJ SOLN
INTRAMUSCULAR | Status: AC
  Filled 2015-06-18: qty 30

## 2015-06-18 MED ORDER — LIDOCAINE HCL (CARDIAC) 20 MG/ML IV SOLN
INTRAVENOUS | Status: DC | PRN
  Administered 2015-06-18: 50 mg via INTRAVENOUS

## 2015-06-18 MED ORDER — SODIUM CHLORIDE 0.9 % IV SOLN: 250.0000 mL | INTRAVENOUS | Status: DC | PRN

## 2015-06-18 MED ORDER — PHENYLEPHRINE 40 MCG/ML (10ML) SYRINGE FOR IV PUSH (FOR BLOOD PRESSURE SUPPORT)
PREFILLED_SYRINGE | INTRAVENOUS | Status: AC
Start: 2015-06-18 — End: 2015-06-18
  Filled 2015-06-18: qty 10

## 2015-06-18 MED ORDER — SUCCINYLCHOLINE CHLORIDE 20 MG/ML IJ SOLN
INTRAMUSCULAR | Status: DC | PRN
  Administered 2015-06-18: 100 mg via INTRAVENOUS

## 2015-06-18 MED ORDER — PHENYLEPHRINE HCL 10 MG/ML IJ SOLN
INTRAMUSCULAR | Status: DC | PRN
  Administered 2015-06-18: 80 ug via INTRAVENOUS

## 2015-06-18 MED ORDER — MIDAZOLAM HCL 5 MG/5ML IJ SOLN
INTRAMUSCULAR | Status: DC | PRN
  Administered 2015-06-18: 2 mg via INTRAVENOUS

## 2015-06-18 MED ORDER — ONDANSETRON HCL 4 MG/2ML IJ SOLN
INTRAMUSCULAR | Status: DC | PRN
  Administered 2015-06-18: 4 mg via INTRAVENOUS

## 2015-06-18 MED ORDER — DEXTROSE 5 % IV SOLN
INTRAVENOUS | Status: AC
  Filled 2015-06-18: qty 2

## 2015-06-18 MED ORDER — ONDANSETRON HCL 4 MG/2ML IJ SOLN: 4.0000 mg | Freq: Once | INTRAMUSCULAR | Status: DC | PRN

## 2015-06-18 MED ORDER — FENTANYL CITRATE (PF) 250 MCG/5ML IJ SOLN
INTRAMUSCULAR | Status: AC
  Filled 2015-06-18: qty 5

## 2015-06-18 MED ORDER — SODIUM CHLORIDE 0.9 % IJ SOLN
INTRAMUSCULAR | Status: AC
  Filled 2015-06-18: qty 10

## 2015-06-18 SURGICAL SUPPLY — 37 items
BLADE HEX COATED 2.75 (ELECTRODE) ×3 IMPLANT
BLADE SURG 15 STRL LF DISP TIS (BLADE) ×1 IMPLANT
BLADE SURG 15 STRL SS (BLADE) ×3
BRIEF STRETCH FOR OB PAD LRG (UNDERPADS AND DIAPERS) ×2 IMPLANT
COVER MAYO STAND STRL (DRAPES) IMPLANT
COVER SURGICAL LIGHT HANDLE (MISCELLANEOUS) ×3 IMPLANT
DECANTER SPIKE VIAL GLASS SM (MISCELLANEOUS) ×5 IMPLANT
DRAPE SHEET LG 3/4 BI-LAMINATE (DRAPES) ×3 IMPLANT
DRSG PAD ABDOMINAL 8X10 ST (GAUZE/BANDAGES/DRESSINGS) ×2 IMPLANT
ELECT PENCIL ROCKER SW 15FT (MISCELLANEOUS) ×3 IMPLANT
ELECT REM PT RETURN 9FT ADLT (ELECTROSURGICAL) ×3
ELECTRODE REM PT RTRN 9FT ADLT (ELECTROSURGICAL) ×1 IMPLANT
GAUZE SPONGE 4X4 12PLY STRL (GAUZE/BANDAGES/DRESSINGS) IMPLANT
GAUZE SPONGE 4X4 16PLY XRAY LF (GAUZE/BANDAGES/DRESSINGS) ×3 IMPLANT
GLOVE BIOGEL PI IND STRL 7.0 (GLOVE) ×1 IMPLANT
GLOVE BIOGEL PI INDICATOR 7.0 (GLOVE) ×4
GLOVE EUDERMIC 7 POWDERFREE (GLOVE) ×3 IMPLANT
GOWN STRL REUS W/TWL LRG LVL3 (GOWN DISPOSABLE) ×1 IMPLANT
GOWN STRL REUS W/TWL XL LVL3 (GOWN DISPOSABLE) ×6 IMPLANT
HEMOSTAT SNOW SURGICEL 2X4 (HEMOSTASIS) IMPLANT
KIT BASIN OR (CUSTOM PROCEDURE TRAY) ×3 IMPLANT
LUBRICANT JELLY K Y 4OZ (MISCELLANEOUS) ×3 IMPLANT
NDL HYPO 21X1.5 SAFETY (NEEDLE) IMPLANT
NDL HYPO 25X1 1.5 SAFETY (NEEDLE) ×1 IMPLANT
NDL SAFETY ECLIPSE 18X1.5 (NEEDLE) IMPLANT
NEEDLE HYPO 18GX1.5 SHARP (NEEDLE)
NEEDLE HYPO 21X1.5 SAFETY (NEEDLE) ×3 IMPLANT
NEEDLE HYPO 25X1 1.5 SAFETY (NEEDLE) IMPLANT
NS IRRIG 1000ML POUR BTL (IV SOLUTION) ×3 IMPLANT
PACK LITHOTOMY IV (CUSTOM PROCEDURE TRAY) ×3 IMPLANT
SPONGE SURGIFOAM ABS GEL 100 (HEMOSTASIS) ×1 IMPLANT
SUT CHROMIC 2 0 SH (SUTURE) IMPLANT
SUT CHROMIC 3 0 SH 27 (SUTURE) IMPLANT
SUT VIC AB 2-0 SH 18 (SUTURE) ×1 IMPLANT
SYR CONTROL 10ML LL (SYRINGE) ×3 IMPLANT
TOWEL OR 17X26 10 PK STRL BLUE (TOWEL DISPOSABLE) ×5 IMPLANT
YANKAUER SUCT BULB TIP 10FT TU (MISCELLANEOUS) ×3 IMPLANT

## 2015-06-18 NOTE — Progress Notes (Signed)
Contacted Dr Dalbert Batman then his office staff for MD order and return to work letter for Pt. Pt  And wife verbalize understanding for Pt to not return to work or lift > 20 lbs for over 2 weeks.

## 2015-06-18 NOTE — Anesthesia Procedure Notes (Signed)
Procedure Name: Intubation Date/Time: 06/18/2015 7:24 AM Performed by: Anne Fu Pre-anesthesia Checklist: Patient identified, Emergency Drugs available, Suction available, Patient being monitored and Timeout performed Patient Re-evaluated:Patient Re-evaluated prior to inductionOxygen Delivery Method: Circle system utilized Preoxygenation: Pre-oxygenation with 100% oxygen Intubation Type: IV induction Ventilation: Mask ventilation without difficulty Laryngoscope Size: Mac and 4 Grade View: Grade III Tube type: Oral Tube size: 7.5 mm Number of attempts: 1 Airway Equipment and Method: Bougie stylet Placement Confirmation: ETT inserted through vocal cords under direct vision,  positive ETCO2,  CO2 detector and breath sounds checked- equal and bilateral Secured at: 21 cm Tube secured with: Tape Dental Injury: Teeth and Oropharynx as per pre-operative assessment

## 2015-06-18 NOTE — Op Note (Signed)
Patient Name:           Tyler Lawrence   Date of Surgery:        06/18/2015  Pre op Diagnosis:      Internal and external hemorrhoids, left anterior, fibroepithelial polyp  Post op Diagnosis:    Same  Procedure:                 Examination under anesthesia, dilatation of stenotic anal sphincter,                                      internal and sternal hemorrhoidectomy, left anterior, single column  Surgeon:                     Edsel Petrin. Dalbert Batman, M.D., FACS  Assistant:                      OR staff  Operative Indications:   The patient is a 69 year old male who presents with hemorrhoids. He returns to discuss his anorectal problems. Past history is significant for internal and external hemorrhoidectomy, 3 columns, by me on July 26, 2012. He had an excellent result and was very pleased for a long time. 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 He doesn't have any bleeding. The has lots of itching and irritation and desires to have something done. Exam reveals a large pedunculated mass in the left anterior position.  This is somewhat firm and looks like a atypical hemorrhoid or a fibroepithelial polyp.  Some internal hemorrhoids in the same area.  Anal sphincters are little bit tight.. Last colonoscopy was last fall with Dr. Gala Romney and he said that nothing bad was found.  Comorbidities include coronary artery disease, coronary artery bypass grafting the past,   Dr. Shelva Majestic has evaluated him preop for cardiac risk assessment.  Operative Findings:       He had a large pedunculated mass in the left anterior position emanating from the dentate line and there were internal hemorrhoids above this.  I did not see any other hemorrhoids internally or externally.  The anal sphincters were somewhat tight and I was able to dilate these slowly over 5 minutes interlocking get for fingers  in.  The mucosa looked normal  Procedure in Detail:          Following the induction of general endotracheal anesthesia the patient was placed in lithotomy position with padded stirrups.  Perianal area was prepped and draped in a sterile fashion.  Surgical timeout was performed.  Intravenous antibiotics were given.  Digital rectal exam reveals smooth internal mucosa and the palpable mass left anterior which was easily prolapsed to the outside.  I used a 50% solution of Exparel and injected circumferentially into the deep and superficial sphincters and a little bit into the anoderm.    I first slowly dilated the anal sphincters digitally.  I did this slowly to avoid any sphincter damage.  This went well.  I then inserted the ano scope and examined the anal canal and the distal rectal mucosa with findings as described above.  Using a 2-0 chromic suture I placed a transfixion suture at the upper end of the internal hemorrhoids in the left anterior position.  Using electrocautery I  conservatively excised the mucosa, the hemorrhoidal plexus, and a large polypoid mass, being careful to avoid damage to the sphincters..  This took the excision to the dentate line but not much externally.  I then closed the mucosa to the dentate line carefully with a running suture of 2-0 chromic.  Hemostasis was excellent.  I placed some Surgicel gauze up into the anal canal up against the suture line.  I watched for 5 minutes and there is no bleeding.  External bandage and fishnet panties were placed.  Patient tolerated the procedure well was taken to PACU in stable condition.  EBL 10 mL.  Counts correct.  Complications none.     Edsel Petrin. Dalbert Batman, M.D., FACS General and Minimally Invasive Surgery Breast and Colorectal Surgery  06/18/2015 8:06 AM

## 2015-06-18 NOTE — Discharge Instructions (Signed)
Take stool softeners and Mira lax daily Follow a high-fiber, low-fat diet Avoid constipation  Call if you have any trouble urinating  Warm tub baths twice a day will help with discomfort  Wear a pad.  There will be some drainage.     [facility] _______[facility]  RECTAL SURGERY POST OP INSTRUCTIONS: POST OP INSTRUCTIONS  Always review your discharge instruction sheet given to you by the facility where your surgery was performed. IF YOU HAVE DISABILITY OR FAMILY LEAVE FORMS, YOU MUST BRING THEM TO THE OFFICE FOR PROCESSING.   DO NOT GIVE THEM TO YOUR DOCTOR.  1. A  prescription for pain medication may be given to you upon discharge.  Take your pain medication as prescribed, if needed.  If narcotic pain medicine is not needed, then you may take acetaminophen (Tylenol) or ibuprofen (Advil) as needed. 2. Take your usually prescribed medications unless otherwise directed. 3. If you need a refill on your pain medication, please contact your pharmacy.  They will contact our office to request authorization. Prescriptions will not be filled after 5 pm or on week-ends. 4. You should follow a light diet the first 48 hours after arrival home, such as soup and crackers, etc.  Be sure to include lots of fluids daily.  Resume your normal diet 2-3 days after surgery.. 5. Most patients will experience some swelling and discomfort in the rectal area. Ice packs, reclining and warm tub soaks will help.  Swelling and discomfort can take several days to resolve.  6. It is common to experience some constipation if taking pain medication after surgery.  Increasing fluid intake and taking a stool softener (such as Colace) will usually help or prevent this problem from occurring.  A mild laxative (Milk of Magnesia or Miralax) should be taken according to package directions if there are no bowel movements after 48 hours. 7. Unless discharge instructions indicate otherwise, leave your bandage dry and in  place for 24 hours, or remove the bandage if you have a bowel movement. You may notice a small amount of bleeding with bowel movements for the first few days. You may have some packing in the rectum which will come out over the first day or two. You will need to wear an absorbent pad or soft cotton gauze in your underwear until the drainage stops.it. 8. ACTIVITIES:  You may resume regular (light) daily activities beginning the next day--such as daily self-care, walking, climbing stairs--gradually increasing activities as tolerated.  You may have sexual intercourse when it is comfortable.  Refrain from any heavy lifting or straining until approved by your doctor. a. You may drive when you are no longer taking prescription pain medication, you can comfortably wear a seatbelt, and you can safely maneuver your car and apply brakes. b. RETURN TO WORK: : ____________________ c.  9. You should see your doctor in the office for a follow-up appointment approximately 2-3 weeks after your surgery.  Make sure that you call for this appointment within a day or two after you arrive home to insure a convenient appointment time. 10. OTHER INSTRUCTIONS:  __________________________________________________________________________________________________________________________________________________________________________________________  WHEN TO CALL YOUR DOCTOR: 1. Fever over 101.0 2. Inability to urinate 3. Nausea and/or vomiting 4. Extreme swelling or bruising 5. Continued bleeding from rectum. 6. Increased pain, redness, or drainage from the incision 7. Constipation  The clinic staff is available to answer your questions during regular business hours.  Please dont hesitate to call and ask to speak to one of  the nurses for clinical concerns.  If you have a medical emergency, go to the nearest emergency room or call 911.  A surgeon from [facility] is always on call at the hospital   [address] [place], [place], [place]  [address] ?  [address], [place], [place]   [phone] ? [phone] ? FAX [phone] Web site: [email or web address]

## 2015-06-18 NOTE — Interval H&P Note (Signed)
History and Physical Interval Note:  06/18/2015 8:06 AM  Tyler Lawrence  has presented today for surgery, with the diagnosis of internal external hemorrhoids  The various methods of treatment have been discussed with the patient and family. After consideration of risks, benefits and other options for treatment, the patient has consented to  Procedure(s): INTERNAL AND EXTERNAL HEMORRHOIDECTOMY SINGLE CLOUMN (N/A) as a surgical intervention .  The patient's history has been reviewed, patient examined, no change in status, stable for surgery.  I have reviewed the patient's chart and labs.  Questions were answered to the patient's satisfaction.     [name]

## 2015-06-18 NOTE — Transfer of Care (Signed)
Immediate Anesthesia Transfer of Care Note  Patient: Tyler Lawrence  Procedure(s) Performed: Procedure(s): INTERNAL AND EXTERNAL HEMORRHOIDECTOMY SINGLE CLOUMN (N/A)  Patient Location: PACU  Anesthesia Type:General  Level of Consciousness:  sedated, patient cooperative and responds to stimulation  Airway & Oxygen Therapy:Patient Spontanous Breathing and Patient connected to face mask oxgen  Post-op Assessment:  Report given to PACU RN and Post -op Vital signs reviewed and stable  Post vital signs:  Reviewed and stable  Last Vitals:  Filed Vitals:   06/18/15 0506  BP: 130/87  Pulse: 69  Temp: 36.6 C  Resp: 18    Complications: No apparent anesthesia complications

## 2015-06-18 NOTE — Anesthesia Postprocedure Evaluation (Signed)
  Anesthesia Post-op Note  Patient: Tyler Lawrence  Procedure(s) Performed: Procedure(s) (LRB): INTERNAL AND EXTERNAL HEMORRHOIDECTOMY SINGLE CLOUMN (N/A)  Patient Location: PACU  Anesthesia Type: General  Level of Consciousness: awake and alert   Airway and Oxygen Therapy: Patient Spontanous Breathing  Post-op Pain: mild  Post-op Assessment: Post-op Vital signs reviewed, Patient's Cardiovascular Status Stable, Respiratory Function Stable, Patent Airway and No signs of Nausea or vomiting  Last Vitals:  Filed Vitals:   06/18/15 0845  BP: 143/70  Pulse: 63  Temp:   Resp: 14    Post-op Vital Signs: stable   Complications: No apparent anesthesia complications

## 2015-06-21 ENCOUNTER — Other Ambulatory Visit: Payer: Self-pay

## 2015-06-21 ENCOUNTER — Encounter (HOSPITAL_COMMUNITY): Payer: Self-pay | Admitting: General Surgery

## 2015-06-21 DIAGNOSIS — R0789 Other chest pain: Secondary | ICD-10-CM

## 2015-07-05 ENCOUNTER — Ambulatory Visit: Payer: Medicare Other | Admitting: Thoracic Surgery (Cardiothoracic Vascular Surgery)

## 2015-07-08 ENCOUNTER — Encounter: Payer: Self-pay | Admitting: Cardiovascular Disease

## 2015-08-20 ENCOUNTER — Ambulatory Visit: Payer: Medicare Other | Admitting: Cardiovascular Disease

## 2015-09-22 ENCOUNTER — Other Ambulatory Visit: Payer: Self-pay | Admitting: Cardiovascular Disease

## 2015-10-11 ENCOUNTER — Ambulatory Visit: Payer: Medicare Other | Admitting: Cardiovascular Disease

## 2015-10-18 ENCOUNTER — Other Ambulatory Visit: Payer: Self-pay | Admitting: Cardiovascular Disease

## 2015-10-19 NOTE — Telephone Encounter (Signed)
Rx(s) sent to pharmacy electronically.  

## 2015-10-26 ENCOUNTER — Other Ambulatory Visit: Payer: Self-pay | Admitting: Surgery

## 2015-10-26 NOTE — H&P (Signed)
Tyler Lawrence. Tyler Lawrence 10/26/2015 9:19 AM Location: [facility] Patient #: [id number] DOB: Oct 10, 1946 Married / Language: English / Race: White Male  History of Present Illness Tyler Hector MD; 10/26/2015 1:14 PM) The patient is a 69 year old male who presents with an inguinal hernia. Patient sent by Sampson Si., [facility]. Primary care physician Dr. Gerarda Fraction for concern of inguinal hernia.  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 Had open RIGHT inguinal hernia repair by Dr. Romona Curls in 2012. He has always had a small lump there postop. Recently it changed. He now gets bulging. Usually reducible. It is become much more sensitive in the past week. Felt it more at work & worried about the hernia has come back. Went to primary care office. Concern for hernia. Surgical consultation requested. We fit him in urgently today. He has had a history of significant hemorrhoids and constipation. 3 surgeries. On MiraLAX. Moves his bowels about 3 times a day. No more hemorrhoid issues. He is very relieved for that. His usual [facility] surgeon, Dr. Dalbert Batman, was not available due to recovering from surgery. Therefore I saw Tyler Lawrence today. Patient does note he has some difficulty starting his stream and urinating but denies any definite prostate issues. History of coronary bypass. Feels a lump at his upper abdomen as well. Wonders if he has hernia there. Was cleared by his cardiologist for surgery on his hemorrhoids earlier in the year.   Past Surgical History Tyler Hector, MD; 10/26/2015 10:26 AM) Coronary Artery Bypass Graft Hemorrhoidectomy 2016 Hemorrhoidectomy, Dr Dalbert Batman Open Inguinal Hernia Surgery - Right 2012 Dr Romona Curls  Allergies Malachi Bonds, CMA; 10/26/2015 9:21 AM) Benadryl *ANTIHISTAMINES* No Known Drug Allergies 07/13/2015 (Marked as Inactive)  Medication History (Chemira Jones, CMA; 10/26/2015 9:21 AM) Simvastatin (20MG  Tablet, Oral daily)  Active. Metoprolol Succinate ER (50MG  Tablet ER 24HR, Oral daily) Active. Losartan Potassium (100MG  Tablet, Oral daily) Active. Aspirin EC (81MG  Tablet DR, Oral daily) Active. MiraLax (Oral as needed) Active. Probiotic (Oral daily) Active. Glucosamine Chondr 500 Complex (Oral daily) Active. Medications Reconciled    Vitals (Chemira Jones CMA; 10/26/2015 9:20 AM) 10/26/2015 9:19 AM Weight: 179.4 lb Height: 69in Body Surface Area: 1.97 m Body Mass Index: 26.49 kg/m  Temp.: 98.57F(Oral)  Pulse: 78 (Regular)  BP: 124/76 (Sitting, Left Arm, Standard)      Physical Exam Tyler Hector MD; 10/26/2015 1:17 PM)  General Mental Status-Alert. General Appearance-Not in acute distress, Not Sickly. Orientation-Oriented X3. Hydration-Well hydrated. Voice-Normal.  Integumentary Global Assessment Upon inspection and palpation of skin surfaces of the - Axillae: non-tender, no inflammation or ulceration, no drainage. and Distribution of scalp and body hair is normal. General Characteristics Temperature - normal warmth is noted.  Head and Neck Head-normocephalic, atraumatic with no lesions or palpable masses. Face Global Assessment - atraumatic, no absence of expression. Neck Global Assessment - no abnormal movements, no bruit auscultated on the right, no bruit auscultated on the left, no decreased range of motion, non-tender. Trachea-midline. Thyroid Gland Characteristics - non-tender.  Eye Eyeball - Left-Extraocular movements intact, No Nystagmus. Eyeball - Right-Extraocular movements intact, No Nystagmus. Cornea - Left-No Hazy. Cornea - Right-No Hazy. Sclera/Conjunctiva - Left-No scleral icterus, No Discharge. Sclera/Conjunctiva - Right-No scleral icterus, No Discharge. Pupil - Left-Direct reaction to light normal. Pupil - Right-Direct reaction to light normal.  ENMT Ears Pinna - Left - no drainage observed, no generalized  tenderness observed. Right - no drainage observed, no generalized tenderness observed. Nose and Sinuses External Inspection of the Nose -  no destructive lesion observed. Inspection of the nares - Left - quiet respiration. Right - quiet respiration. Mouth and Throat Lips - Upper Lip - no fissures observed, no pallor noted. Lower Lip - no fissures observed, no pallor noted. Nasopharynx - no discharge present. Oral Cavity/Oropharynx - Tongue - no dryness observed. Oral Mucosa - no cyanosis observed. Hypopharynx - no evidence of airway distress observed.  Chest and Lung Exam Inspection Movements - Normal and Symmetrical. Accessory muscles - No use of accessory muscles in breathing. Palpation Palpation of the chest reveals - Non-tender. Auscultation Breath sounds - Normal and Clear.  Cardiovascular Auscultation Rhythm - Regular. Murmurs & Other Heart Sounds - Auscultation of the heart reveals - No Murmurs and No Systolic Clicks.  Abdomen Inspection Inspection of the abdomen reveals - No Visible peristalsis and No Abnormal pulsations. Umbilicus - No Bleeding, No Urine drainage. Palpation/Percussion Palpation and Percussion of the abdomen reveal - Soft, Non Tender, No Rebound tenderness, No Rigidity (guarding) and No Cutaneous hyperesthesia. Note: Soft and flat. Nontender nondistended. Small but definite LEFT subxiphoid hernia reducible. Present mainly on cough. Underlying one of his chest tube incisions and the lower part of the sternotomy.  Male Genitourinary Sexual Maturity Tanner 5 - Adult hair pattern and Adult penile size and shape. Note: Uncircumcised male. LEFT testicle with atrophy. He tells me that been there most his life. The at the LEFT groin but no definite hernia. This RIGHT inguinal hernia very sensitive but reducible. Old RIGHT groin incision consistent with prior open repair. Cords okay.  Peripheral Vascular Upper Extremity Inspection - Left - No Cyanotic  nailbeds, Not Ischemic. Right - No Cyanotic nailbeds, Not Ischemic.  Neurologic Neurologic evaluation reveals -normal attention span and ability to concentrate, able to name objects and repeat phrases. Appropriate fund of knowledge , normal sensation and normal coordination. Mental Status Affect - not angry, not paranoid. Cranial Nerves-Normal Bilaterally. Gait-Normal.  Neuropsychiatric Mental status exam performed with findings of-able to articulate well with normal speech/language, rate, volume and coherence, thought content normal with ability to perform basic computations and apply abstract reasoning and no evidence of hallucinations, delusions, obsessions or homicidal/suicidal ideation.  Musculoskeletal Global Assessment Spine, Ribs and Pelvis - no instability, subluxation or laxity. Right Upper Extremity - no instability, subluxation or laxity.  Lymphatic Head & Neck  General Head & Neck Lymphatics: Bilateral - Description - No Localized lymphadenopathy. Axillary  General Axillary Region: Bilateral - Description - No Localized lymphadenopathy. Femoral & Inguinal  Generalized Femoral & Inguinal Lymphatics: Left - Description - No Localized lymphadenopathy. Right - Description - No Localized lymphadenopathy.    Assessment & Plan Tyler Hector MD; 10/26/2015 10:58 AM)  Fatima Blank HERNIA, WITHOUT OBSTRUCTION OR GANGRENE (K43.2) Impression: Small but definite incisional hernia at one of his chest tubes/sternotomy sites in the LEFT subxiphoid region. It is probably small enough that he just needs a primary repair.  RECURRENT RIGHT INGUINAL HERNIA (K40.91) Impression: Definite recurrent RIGHT inguinal hernia. Some sensitivity at the LEFT groin suspicious for an early small LEFT inguinal hernia as well. Would plan laparoscopic repair. He is hoping I could do at this week. I noted on very realistic this close to the end of the year but we will try to do it urgently since he  is rather symptomatic.  He does have coronary issues but was cleared by his cardiologist early in the year.  I biggest concern is a risk of urinary retention. Will have a prescription for Flomax to get him on  that to minimize that risk.  Current Plans You are being scheduled for surgery - Our schedulers will call you.  You should hear from our office's scheduling department within 5 working days about the location, date, and time of surgery. We try to make accommodations for patient's preferences in scheduling surgery, but sometimes the OR schedule or the surgeon's schedule prevents [place] from making those accommodations.  If you have not heard from our office [phone]) in 5 working days, call the office and ask for your surgeon's nurse.  If you have other questions about your diagnosis, plan, or surgery, call the office and ask for your surgeon's nurse.  Pt Education - Pamphlet Given - Laparoscopic Hernia Repair: discussed with patient and provided information. Pt Education - [facility] Hernia Post-Op HCI (Nadiyah Zeis): discussed with patient and provided information. Pt Education - [facility] Pain Control (Eboni Coval) Started Tamsulosin HCl 0.4MG , 1 (one) Tablet twice a day, #20, 10/26/2015, Ref. x1. Local Order: to help with urination  Tyler Lawrence, M.D., F.A.C.S. Gastrointestinal and Minimally Invasive Surgery [facility] [address] [place], [place] [address] [phone] Main / Paging

## 2015-12-24 ENCOUNTER — Other Ambulatory Visit: Payer: Self-pay | Admitting: Cardiovascular Disease

## 2015-12-24 NOTE — Telephone Encounter (Signed)
Rx request sent to pharmacy.  

## 2015-12-27 ENCOUNTER — Ambulatory Visit (INDEPENDENT_AMBULATORY_CARE_PROVIDER_SITE_OTHER): Payer: Self-pay | Admitting: Cardiovascular Disease

## 2015-12-27 ENCOUNTER — Encounter: Payer: Self-pay | Admitting: Cardiovascular Disease

## 2015-12-27 VITALS — BP 130/80 | HR 55 | Ht 69.0 in | Wt 181.0 lb

## 2015-12-27 DIAGNOSIS — E782 Mixed hyperlipidemia: Secondary | ICD-10-CM

## 2015-12-27 DIAGNOSIS — I2510 Atherosclerotic heart disease of native coronary artery without angina pectoris: Secondary | ICD-10-CM

## 2015-12-27 DIAGNOSIS — I447 Left bundle-branch block, unspecified: Secondary | ICD-10-CM

## 2015-12-27 DIAGNOSIS — I10 Essential (primary) hypertension: Secondary | ICD-10-CM

## 2015-12-27 DIAGNOSIS — I2583 Coronary atherosclerosis due to lipid rich plaque: Secondary | ICD-10-CM

## 2015-12-27 NOTE — Patient Instructions (Signed)
Your physician recommends that you return for lab work fasting.   Your physician wants you to follow-up in: 1 year or sooner if needed. You will receive a reminder letter in the mail two months in advance. If you don't receive a letter, please call our office to schedule the follow-up appointment.  If you need a refill on your cardiac medications before your next appointment, please call your pharmacy.    

## 2015-12-27 NOTE — Progress Notes (Signed)
Patient ID: Tyler Lawrence, male   DOB: 22-Oct-1946, 70 y.o.   MRN: [id number]        Primary M.D.: Dr. Redmond School  HPI: Tyler Lawrence is a 70 y.o. male who presents to the office today for a 40 month cardiology evaluation.  Tyler Lawrence has CAD and underwent CABG surgery (LIMA to the LAD, vein to the diagonal, and vein to circumflex coronary artery) in March 2001. His last cardiac catheterization in April 2005 showed total native occlusion of his LAD but RCA was nondominant and had a 70-80% stenosis. He has a history of hyperlipidemia and has been on  therapy with simvastatin 20 mg.  His last nuclear perfusion study was on 04/04/2012 was unchanged from previously and only showed a minimal perfusion defect in the basal anterior basal anteroseptal regions consistent with proximal LAD scar. Otherwise perfusion was excellent without ischemia.  He has undergone hemorrhoidal surgery and has a rectal polyp.  His eye last saw him, he tells me he underwent repeat hemorrhoidal surgery by Dr. Dalbert Batman.  In December 2016.  He underwent I lateral inguinal hernia surgery as well as ventral hernia surgery done laparoscopically.  He now feels significantly improved and has been able to resume exercise and activity.  He denies any episodes of chest pain, PND or orthopnea.  He is unaware of palpitations.  He has a history of chronic left bundle branch block.  He is now on losartan 100 mg and Toprol-XL 50 mg daily for blood pressure control CAD.  He is on simvastatin 20 mg for hyperlipidemia.  He does take nonsteroidal anti-inflammatory medication.  He presents for evaluation.   Past Medical History  Diagnosis Date  . Hemorrhoid     ARE ITCHING AND UNCOMFORTABLE  . Back pain     PT STATES HE HAS FLARE UPS OF LOWER BACK AND LEFT LEG PAIN -MAYBE A LITTLE NUMBNESS--PT STATES HE WAS TOLD HE HAS A PINCHED NERVE.  Marland Kitchen Complication of anesthesia     PT STATES SURGERY [facility] 2012  SPINAL ATTEMPTED FOR  HEMORRHOIDECTOMY AND HERNIA REPAIR.  PT STATES WHEN NEEDLE WAS PUT IN HIS BACK - HE FELT ELECTRICAL SHOCK DOWN LEFT GROIN AND LEFT LEG.  PT STATES THAT HE WAS THEN PUT TO  SLEEP.  . Fever blister 07/25/12    ON UPPER LIP  . Left bundle branch block     CHRONIC  . Heart palpitations     occasional   . GERD (gastroesophageal reflux disease)   . Arthritis     HANDS HURT.  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 Weakness of left arm     slight left arm weakness    . CAD (coronary artery disease)     DR. Belknap; CABG 2001,HEART CATH 2005 -PATENT GRAFTS, "LUMINAL IRREGULARITY IN THE VEIN TO THE DIAGONAL VESSEL.  HIS RCA WHICH WAS NONDOMINANT, HAD 78% STENOSIS."  NUCLEAR STRESS TEST 04/04/12--"ESSENTIALLY UNCHANGED FROM PRIOR STUDY AND SHOWED A SMALL PERFUSION DEFECT IN THE BASAL ANTERIOR AND BASAL ANTEROSEPTAL REGION CONSISTENT WITH PROXIMAL LAD SCAR. "  . Hypertension     Past Surgical History  Procedure Laterality Date  . Rt hernia repair in march of 2012      STATES HE ALSO HAD HEMORRHODECTOMY AT THE SAME TIME  . Appendectomy    . Elbow surgery      for a chipped bone  . Hemorroidectomy  MANY YEARS AGO  . Coronary artery bypass graft  01/2000  . Hemorrhoid surgery  07/26/2012    Dr. Dalbert Batman  . Colonoscopy      remote past, can't remember  . Colonoscopy N/A 04/20/2014    Dr. Gala Romney- grade 3 hemorrhoids, o/w normal ileocolonoscopy  . Cardiac catheterization  02/2004  . Hemorrhoid surgery N/A 06/18/2015    Procedure: INTERNAL AND EXTERNAL HEMORRHOIDECTOMY SINGLE CLOUMN;  Surgeon: Fanny Skates, MD;  Location: [facility];  Service: General;  Laterality: N/A;    Allergies  Allergen Reactions  . Benadryl [Diphenhydramine] Hives    Current Outpatient Prescriptions  Medication Sig Dispense Refill  . aspirin EC 81 MG tablet Take 81 mg by mouth at bedtime.     . celecoxib (CELEBREX) 200 MG capsule 200 mg once.  1  . Chlorpheniramine-APAP  (CORICIDIN) 2-325 MG TABS Take by mouth 2 (two) times daily.    Marland Kitchen losartan (COZAAR) 100 MG tablet Take 100 mg by mouth at bedtime.   0  . metoprolol succinate (TOPROL-XL) 50 MG 24 hr tablet TAKE 1 TABLET (50 MG TOTAL) BY MOUTH DAILY. 90 tablet 0  . naproxen sodium (ANAPROX) 220 MG tablet Take 220 mg by mouth 2 (two) times daily with a meal.    . polyethylene glycol (MIRALAX / GLYCOLAX) packet Take 17 g by mouth at bedtime.    . simvastatin (ZOCOR) 20 MG tablet TAKE 1 TABLET (20 MG TOTAL) BY MOUTH AT BEDTIME. 90 tablet 1   No current facility-administered medications for this visit.    Social he is married has 2 children 4 grandchildren. He quit smoking in 1991. There is no alcohol use. He stays busy at work but does not routinely exercise.   ROS General: Negative; No fevers, chills, or night sweats;  HEENT: Negative; No changes in vision or hearing, sinus congestion, difficulty swallowing Pulmonary: Negative; No cough, wheezing, shortness of breath, hemoptysis Cardiovascular: Negative; No chest pain, presyncope, syncope, palpitations GI: Negative; No nausea, vomiting, diarrhea, or abdominal pain GU: Positive for hemorrhoidal surgery. Musculoskeletal: Negative; no myalgias, joint pain, or weakness Hematologic/Oncology: Negative; no easy bruising, bleeding Endocrine: Negative; no heat/cold intolerance; no diabetes Neuro: Negative; no changes in balance, headaches Skin: Negative; No rashes or skin lesions Psychiatric: Negative; No behavioral problems, depression Sleep: Negative; No snoring, daytime sleepiness, hypersomnolence, bruxism, restless legs, hypnogognic hallucinations, no cataplexy Other comprehensive 14 point system review is negative.  PE BP 130/80 mmHg  Pulse 55  Ht _0  (1.753 m)  Wt 181 lb (82.101 kg)  BMI 26.72 kg/m2   Wt Readings from Last 3 Encounters:  12/27/15 181 lb (82.101 kg)  06/18/15 179 lb (81.194 kg)  06/11/15 179 lb (81.194 kg)   General: Alert,  oriented, no distress.  HEENT: Normocephalic, atraumatic. Pupils round and reactive; sclera anicteric;no lid lag,  Nose without nasal septal hypertrophy Mouth/Parynx benign; Mallinpatti scale  Neck: No JVD, no carotid bruits with normal carotid upstroke Lungs: clear to ausculatation and percussion; no wheezing or rales Chest wall: nontender to palpation Heart: RRR, s1 s2 normal  1/6 systolic murmur; .  No diastolic murmur.  No rubs thrills or heaves  Abdomen: soft, nontender; no hepatosplenomehaly, BS+; abdominal aorta nontender and not dilated by palpation. Back: No CVA tenderness Pulses 2+ Extremities: no clubbing cyanosis or edema, Homan's sign negative  Neurologic: grossly nonfocal Psychological: Normal affect and mood  ECG (independently read by me): Sinus bradycardia 55 bpm.  Left bundle branch block which is chronic with repolarization changes.  November 2015 ECG (independently read by me): Sinus bradycardia at 54 bpm.  Left bundle branch block.  Poor R-wave progression.  June 2014 ECG: Normal sinus rhythm with left bundle branch block which is old.   LABS:  BMP Latest Ref Rng 06/11/2015 03/27/2014 05/07/2013  Glucose 65 - 99 mg/dL 117(H) 100(H) 118(H)  BUN 6 - 20 mg/dL _0 Creatinine 0.61 - 1.24 mg/dL 0.83 0.89 0.92  Sodium 135 - 145 mmol/L 139 139 141  Potassium 3.5 - 5.1 mmol/L 3.9 4.3 4.3  Chloride 101 - 111 mmol/L 107 106 106  CO2 22 - 32 mmol/L _1 Calcium 8.9 - 10.3 mg/dL 8.7(L) 9.3 9.2   Hepatic Function Latest Ref Rng 06/11/2015 03/27/2014 05/07/2013  Total Protein 6.5 - 8.1 g/dL 6.2(L) 6.3 6.5  Albumin 3.5 - 5.0 g/dL 3.7 4.1 4.2  AST 15 - 41 U/L _2 ALT 17 - 63 U/L _3 Alk Phosphatase 38 - 126 U/L 65 93 68  Total Bilirubin 0.3 - 1.2 mg/dL 0.7 1.0 1.1   CBC Latest Ref Rng 06/11/2015 05/07/2013 07/25/2012  WBC 4.0 - 10.5 K/uL 8.7 6.9 8.7  Hemoglobin 13.0 - 17.0 g/dL 13.7 15.4 15.0  Hematocrit 39.0 - 52.0 % 39.8 43.4 42.5  Platelets 150 - 400  K/uL 178 165 204   Lab Results  Component Value Date   MCV 89.8 06/11/2015   MCV 87.5 05/07/2013   MCV 87.4 07/25/2012   Lab Results  Component Value Date   TSH 0.918 05/07/2013  No results found for: HGBA1C  Lipid Panel     Component Value Date/Time   CHOL 110 03/27/2014 0831   TRIG 59 03/27/2014 0831   HDL 36* 03/27/2014 0831   CHOLHDL 3.1 03/27/2014 0831   VLDL 12 03/27/2014 0831   LDLCALC 62 03/27/2014 0831    RADIOLOGY: No results found.    ASSESSMENT AND PLAN: Tyler Lawrence  is a 70 year old occasion male who is 16 years status post CABG revascularization surgery.  From a cardiac standpoint he has been without anginal symptomatology or CHF symptomatology.  He has a strenuous job requiring a lot of lifting.  He recently required hernia surgery for bilateral inguinal hernias as well as a ventral hernia.  He also had undergone surgery for hemorrhoidal disease.  He has tolerated these without cardiovascular compromise.  He now is been able to resume his activity following recuperation from his hernia surgery.  He is without angina symptoms or CHF symptoms.  There are no palpitations.  His blood pressure today is stable on losartan 100 mg and Toprol-XL 50 mg.  He is on simvastatin 20 mg for hyperlipidemia.  I am recommending a complete set of lab work be obtained in the fasting state on his current medical regimen.  I will contact him regarding the results and if adjustments need to be made to his medical regimen.  He has sinus bradycardia but this is well tolerated and he is asymptomatic without dizziness.  I will see him in one year for reevaluation.   Time spent: 25 minutes   Troy Sine, MD, [facility]  12/27/2015 2:32 PM

## 2015-12-28 ENCOUNTER — Ambulatory Visit: Payer: BLUE CROSS/BLUE SHIELD | Admitting: Cardiovascular Disease

## 2016-01-05 ENCOUNTER — Other Ambulatory Visit: Payer: Self-pay | Admitting: Cardiovascular Disease

## 2016-01-06 LAB — CBC WITH DIFFERENTIAL/PLATELET
BASOS ABS: 0.1 10*3/uL (ref 0.0–0.2)
BASOS: 1 %
EOS (ABSOLUTE): 0.5 10*3/uL — AB (ref 0.0–0.4)
Eos: 7 %
Hematocrit: 42.9 % (ref 37.5–51.0)
Hemoglobin: 14.8 g/dL (ref 12.6–17.7)
IMMATURE GRANS (ABS): 0 10*3/uL (ref 0.0–0.1)
IMMATURE GRANULOCYTES: 0 %
LYMPHS: 35 %
Lymphocytes Absolute: 2.7 10*3/uL (ref 0.7–3.1)
MCH: 31 pg (ref 26.6–33.0)
MCHC: 34.5 g/dL (ref 31.5–35.7)
MCV: 90 fL (ref 79–97)
Monocytes Absolute: 0.5 10*3/uL (ref 0.1–0.9)
Monocytes: 7 %
NEUTROS PCT: 50 %
Neutrophils Absolute: 4 10*3/uL (ref 1.4–7.0)
PLATELETS: 228 10*3/uL (ref 150–379)
RBC: 4.77 x10E6/uL (ref 4.14–5.80)
RDW: 12.9 % (ref 12.3–15.4)
WBC: 7.8 10*3/uL (ref 3.4–10.8)

## 2016-01-06 LAB — COMPREHENSIVE METABOLIC PANEL
ALBUMIN: 4.2 g/dL (ref 3.6–4.8)
ALT: 18 IU/L (ref 0–44)
AST: 18 IU/L (ref 0–40)
Albumin/Globulin Ratio: 1.9 (ref 1.1–2.5)
Alkaline Phosphatase: 97 IU/L (ref 39–117)
BILIRUBIN TOTAL: 0.7 mg/dL (ref 0.0–1.2)
BUN / CREAT RATIO: 18 (ref 10–22)
BUN: 16 mg/dL (ref 8–27)
CALCIUM: 9.3 mg/dL (ref 8.6–10.2)
CHLORIDE: 102 mmol/L (ref 96–106)
CO2: 24 mmol/L (ref 18–29)
Creatinine, Ser: 0.89 mg/dL (ref 0.76–1.27)
GFR, EST AFRICAN AMERICAN: 102 mL/min/{1.73_m2} (ref >59)
GFR, EST NON AFRICAN AMERICAN: 88 mL/min/{1.73_m2} (ref >59)
GLUCOSE: 110 mg/dL — AB (ref 65–99)
Globulin, Total: 2.2 g/dL (ref 1.5–4.5)
Potassium: 4.3 mmol/L (ref 3.5–5.2)
Sodium: 139 mmol/L (ref 134–144)
TOTAL PROTEIN: 6.4 g/dL (ref 6.0–8.5)

## 2016-01-06 LAB — TSH: TSH: 1.4 u[IU]/mL (ref 0.450–4.500)

## 2016-01-06 LAB — LIPID PANEL W/O CHOL/HDL RATIO
Cholesterol, Total: 121 mg/dL (ref 100–199)
HDL: 38 mg/dL — ABNORMAL LOW (ref >39)
LDL CALC: 73 mg/dL (ref 0–99)
Triglycerides: 50 mg/dL (ref 0–149)
VLDL CHOLESTEROL CAL: 10 mg/dL (ref 5–40)

## 2016-01-07 ENCOUNTER — Encounter: Payer: Self-pay | Admitting: *Deleted

## 2016-03-28 ENCOUNTER — Other Ambulatory Visit: Payer: Self-pay | Admitting: Cardiovascular Disease

## 2016-04-12 ENCOUNTER — Other Ambulatory Visit: Payer: Self-pay | Admitting: Cardiovascular Disease

## 2016-04-13 NOTE — Telephone Encounter (Signed)
Rx(s) sent to pharmacy electronically.  

## 2016-10-05 DIAGNOSIS — E663 Overweight: Secondary | ICD-10-CM | POA: Diagnosis not present

## 2016-10-05 DIAGNOSIS — B349 Viral infection, unspecified: Secondary | ICD-10-CM | POA: Diagnosis not present

## 2016-10-05 DIAGNOSIS — E782 Mixed hyperlipidemia: Secondary | ICD-10-CM | POA: Diagnosis not present

## 2016-10-05 DIAGNOSIS — E669 Obesity, unspecified: Secondary | ICD-10-CM | POA: Diagnosis not present

## 2016-10-05 DIAGNOSIS — Z6827 Body mass index (BMI) 27.0-27.9, adult: Secondary | ICD-10-CM | POA: Diagnosis not present

## 2016-10-05 DIAGNOSIS — I10 Essential (primary) hypertension: Secondary | ICD-10-CM | POA: Diagnosis not present

## 2016-10-05 DIAGNOSIS — J343 Hypertrophy of nasal turbinates: Secondary | ICD-10-CM | POA: Diagnosis not present

## 2016-10-05 DIAGNOSIS — Z1389 Encounter for screening for other disorder: Secondary | ICD-10-CM | POA: Diagnosis not present

## 2016-10-05 DIAGNOSIS — R6889 Other general symptoms and signs: Secondary | ICD-10-CM | POA: Diagnosis not present

## 2016-10-05 DIAGNOSIS — R070 Pain in throat: Secondary | ICD-10-CM | POA: Diagnosis not present

## 2016-10-05 DIAGNOSIS — J069 Acute upper respiratory infection, unspecified: Secondary | ICD-10-CM | POA: Diagnosis not present

## 2016-11-21 ENCOUNTER — Other Ambulatory Visit: Payer: Self-pay | Admitting: Nurse Practitioner

## 2016-12-04 DIAGNOSIS — Z6829 Body mass index (BMI) 29.0-29.9, adult: Secondary | ICD-10-CM | POA: Diagnosis not present

## 2016-12-04 DIAGNOSIS — J069 Acute upper respiratory infection, unspecified: Secondary | ICD-10-CM | POA: Diagnosis not present

## 2016-12-04 DIAGNOSIS — I10 Essential (primary) hypertension: Secondary | ICD-10-CM | POA: Diagnosis not present

## 2016-12-04 DIAGNOSIS — E782 Mixed hyperlipidemia: Secondary | ICD-10-CM | POA: Diagnosis not present

## 2016-12-04 DIAGNOSIS — J343 Hypertrophy of nasal turbinates: Secondary | ICD-10-CM | POA: Diagnosis not present

## 2016-12-04 DIAGNOSIS — E663 Overweight: Secondary | ICD-10-CM | POA: Diagnosis not present

## 2016-12-04 DIAGNOSIS — J209 Acute bronchitis, unspecified: Secondary | ICD-10-CM | POA: Diagnosis not present

## 2016-12-04 DIAGNOSIS — R070 Pain in throat: Secondary | ICD-10-CM | POA: Diagnosis not present

## 2016-12-04 DIAGNOSIS — E669 Obesity, unspecified: Secondary | ICD-10-CM | POA: Diagnosis not present

## 2016-12-17 ENCOUNTER — Other Ambulatory Visit: Payer: Self-pay | Admitting: Cardiovascular Disease

## 2017-01-03 ENCOUNTER — Other Ambulatory Visit: Payer: Self-pay | Admitting: Cardiovascular Disease

## 2017-01-03 NOTE — Telephone Encounter (Signed)
Rx(s) sent to pharmacy electronically.  

## 2017-01-05 ENCOUNTER — Ambulatory Visit: Payer: Self-pay | Admitting: Cardiovascular Disease

## 2017-02-26 ENCOUNTER — Telehealth: Payer: Self-pay | Admitting: Cardiovascular Disease

## 2017-02-26 NOTE — Telephone Encounter (Signed)
ok 

## 2017-02-26 NOTE — Telephone Encounter (Signed)
New message   Pt is calling stating he broke a wire. He said it was poking out, but now it is not. He said there is a knot and it's very sore.

## 2017-02-26 NOTE — Telephone Encounter (Signed)
Spoke to patient. We discussed, he had sternal wires placed in ~2001 w his open heart surgery. He thinks one of the wires broke. He states this occurred a "good while ago". Was poking through skin, but then he was moving/lifting something and thinks the end of the wire went back into skin. He has a small knot but states no bleeding, abnormal healing, pain, fever, or distress.  He notes Dr. Roxy Manns did the surgery and he was supposed to see him in 2016 for possible protrusion of sternal wires at that time, but had to cancel appt due to loss of job/insurance.  Pt requested number to Dr. Guy Sandifer office - [facility] - I provided this. Pt aware to follow up w them. Routed to Dr. Claiborne Billings as Juluis Rainier.

## 2017-02-27 ENCOUNTER — Other Ambulatory Visit: Payer: Self-pay | Admitting: Thoracic Surgery (Cardiothoracic Vascular Surgery)

## 2017-02-27 DIAGNOSIS — T8189XA Other complications of procedures, not elsewhere classified, initial encounter: Secondary | ICD-10-CM

## 2017-03-06 ENCOUNTER — Encounter: Payer: Self-pay | Admitting: Thoracic Surgery (Cardiothoracic Vascular Surgery)

## 2017-03-06 ENCOUNTER — Ambulatory Visit (INDEPENDENT_AMBULATORY_CARE_PROVIDER_SITE_OTHER): Payer: BLUE CROSS/BLUE SHIELD | Admitting: Thoracic Surgery (Cardiothoracic Vascular Surgery)

## 2017-03-06 ENCOUNTER — Other Ambulatory Visit: Payer: Self-pay | Admitting: *Deleted

## 2017-03-06 ENCOUNTER — Other Ambulatory Visit: Payer: Self-pay

## 2017-03-06 ENCOUNTER — Ambulatory Visit
Admission: RE | Admit: 2017-03-06 | Discharge: 2017-03-06 | Disposition: A | Discharge status: Home / Self Care | Payer: Medicare Other | Source: Ambulatory Visit | Attending: Thoracic Surgery (Cardiothoracic Vascular Surgery) | Admitting: Thoracic Surgery (Cardiothoracic Vascular Surgery)

## 2017-03-06 VITALS — BP 130/71 | HR 83 | Resp 16 | Ht 68.0 in | Wt 178.0 lb

## 2017-03-06 DIAGNOSIS — Z951 Presence of aortocoronary bypass graft: Secondary | ICD-10-CM | POA: Diagnosis not present

## 2017-03-06 DIAGNOSIS — R0789 Other chest pain: Secondary | ICD-10-CM

## 2017-03-06 DIAGNOSIS — T8189XA Other complications of procedures, not elsewhere classified, initial encounter: Secondary | ICD-10-CM

## 2017-03-06 DIAGNOSIS — R079 Chest pain, unspecified: Secondary | ICD-10-CM | POA: Diagnosis not present

## 2017-03-06 DIAGNOSIS — S2220XB Unspecified fracture of sternum, initial encounter for open fracture: Secondary | ICD-10-CM

## 2017-03-06 NOTE — Patient Instructions (Signed)
   Continue taking all current medications without change through the day before surgery.  Have nothing to eat or drink after midnight the night before surgery.  On the morning of surgery take only Toprol XL with a sip of water.    

## 2017-03-06 NOTE — Progress Notes (Addendum)
[address]       [place], [address]             [phone]     CARDIOTHORACIC SURGERY CONSULTATION REPORT  Referring Provider is Redmond School, MD  Primary Cardiologist is Troy Sine, MD PCP is Glo Herring, MD  Chief Complaint  Patient presents with  . Routine Post Op    exposed sternal wire for about a year.Marland KitchenGOES IN AND OUT .Marland Kitchen.CXR.....s/p CABG 2001/KELLY    HPI:  Patient is a 71 year old male with history of coronary artery disease status post coronary artery bypass grafting 3 in 2001 for severe 2 vessel coronary artery disease, hypertension, and hyperlipidemia who is been referred for surgical consultation of recently discovered protruding sternal wire with a tender painful lower sternum. The patient states that he has done well from a cardiac standpoint ever since his heart surgery in 2001. He underwent a follow-up catheterization several years later because of atypical chest pain, and all of his bypass grafts were patent. He has been followed regularly ever since by Dr. Claiborne Billings. Over the past year he made an effort to lose some weight and lost approximately 20 pounds. He began to notice a sternal wire protruding through the skin at the inferior aspect of his previous sternotomy. 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 He recalls accidentally hitting himself in the chest while he was working, but he denies any associated sensation of sternal clicking or motion. Over the last 2 weeks he has developed tenderness and swelling around this area.  He denies any known fevers although he feels as though he may have had some low-grade fevers. Appetite is good. He denies any exertional chest pain or chest tightness. He has not had any shortness of breath either with activity or at rest.  Past Medical History:  Diagnosis Date  . Arthritis    HANDS HURT.  HX OF NERVE COMPRESSION IN  LOWER BACK THAT CAUSES PAIN -AND LEFT LEG PAIN  . Back pain    PT STATES HE HAS FLARE UPS OF LOWER BACK AND LEFT LEG PAIN -MAYBE A LITTLE NUMBNESS--PT STATES HE WAS TOLD HE HAS A PINCHED NERVE.  Marland Kitchen CAD (coronary artery disease)    DR. Kilbourne; CABG 2001,HEART CATH 2005 -PATENT GRAFTS, "LUMINAL IRREGULARITY IN THE VEIN TO THE DIAGONAL VESSEL.  HIS RCA WHICH WAS NONDOMINANT, HAD 78% STENOSIS."  NUCLEAR STRESS TEST 04/04/12--"ESSENTIALLY UNCHANGED FROM PRIOR STUDY AND SHOWED A SMALL PERFUSION DEFECT IN THE BASAL ANTERIOR AND BASAL ANTEROSEPTAL REGION CONSISTENT WITH PROXIMAL LAD SCAR. "  . Complication of anesthesia    PT STATES SURGERY AT [facility] 2012  SPINAL ATTEMPTED FOR HEMORRHOIDECTOMY AND HERNIA REPAIR.  PT STATES WHEN NEEDLE WAS PUT IN HIS BACK - HE FELT ELECTRICAL SHOCK DOWN LEFT GROIN AND LEFT LEG.  PT STATES THAT HE WAS THEN PUT TO  SLEEP.  . Fever blister 07/25/12   ON UPPER LIP  . GERD (gastroesophageal reflux disease)   . Heart palpitations    occasional   . Hemorrhoid    ARE ITCHING AND UNCOMFORTABLE  . Hypertension   . Left bundle branch block    CHRONIC  . Protruding sternal wires   . 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 Weakness of left arm    slight left  arm weakness      Past Surgical History:  Procedure Laterality Date  . APPENDECTOMY    . CARDIAC CATHETERIZATION  02/2004  . COLONOSCOPY     remote past, can't remember  . COLONOSCOPY N/A 04/20/2014   Dr. Gala Romney- grade 3 hemorrhoids, o/w normal ileocolonoscopy  . CORONARY ARTERY BYPASS GRAFT  01/26/2000  . ELBOW SURGERY     for a chipped bone  . HEMORRHOID SURGERY  07/26/2012   Dr. Dalbert Batman  . HEMORRHOID SURGERY N/A 06/18/2015   Procedure: INTERNAL AND EXTERNAL HEMORRHOIDECTOMY SINGLE CLOUMN;  Surgeon: Fanny Skates, MD;  Location: [facility];  Service: General;  Laterality: N/A;  . HEMORROIDECTOMY     MANY YEARS AGO  . Rt hernia repair in March of 2012     STATES HE ALSO  HAD HEMORRHODECTOMY AT THE SAME TIME    Family History  Problem Relation Age of Onset  . Heart disease Father   . Heart failure Father   . Heart disease Paternal Grandfather   . Colon cancer Neg Hx   . Heart failure Mother   . Heart attack Mother   . Diabetes Mother   . Heart attack Maternal Grandmother     Social History   Social History  . Marital status: Married    Spouse name: N/A  . Number of children: N/A  . Years of education: N/A   Occupational History  . Not on file.   Social History Main Topics  . Smoking status: Former Smoker    Packs/day: 2.00    Years: 10.00  . Smokeless tobacco: Never Used     Comment: QUIT 1991  . Alcohol use No  . Drug use: No  . Sexual activity: Not on file   Other Topics Concern  . Not on file   Social History Narrative  . No narrative on file    Current Outpatient Prescriptions  Medication Sig Dispense Refill  . aspirin EC 81 MG tablet Take 81 mg by mouth at bedtime.     Marland Kitchen losartan (COZAAR) 100 MG tablet Take 100 mg by mouth at bedtime.   0  . metoprolol succinate (TOPROL-XL) 50 MG 24 hr tablet TAKE 1 TABLET BY MOUTH EVERY DAY 90 tablet 2  . simvastatin (ZOCOR) 20 MG tablet Take 1 tablet (20 mg total) by mouth daily at 6 PM. PLEASE CONTACT THE OFFICE FOR ADDITIONAL REFILLS 30 tablet 0   No current facility-administered medications for this visit.     Allergies  Allergen Reactions  . Benadryl [Diphenhydramine] Hives      Review of Systems:   General:  normal appetite, normal energy, no weight gain, + weight loss, possible low grade fever  Cardiac:  no chest pain with exertion, + chest pain at rest, no SOB with exertion, no resting SOB, no PND, no orthopnea, no palpitations, no arrhythmia, no atrial fibrillation, no LE edema, no dizzy spells, no syncope  Respiratory:  no shortness of breath, no home oxygen, no productive cough, no dry cough, no bronchitis, no wheezing, no hemoptysis, no asthma, + pain with inspiration  or cough, no sleep apnea, no CPAP at night  GI:   no difficulty swallowing, no reflux, no frequent heartburn, no hiatal hernia, no abdominal pain, no constipation, no diarrhea, no hematochezia, no hematemesis, no melena  GU:   no dysuria,  no frequency, no urinary tract infection, no hematuria, no enlarged prostate, no kidney stones, no kidney disease  Vascular:  no pain suggestive of claudication, no pain  in feet, no leg cramps, no varicose veins, no DVT, no non-healing foot ulcer  Neuro:   no stroke, no TIA's, no seizures, no headaches, no temporary blindness one eye,  no slurred speech, no peripheral neuropathy, no chronic pain, no instability of gait, no memory/cognitive dysfunction  Musculoskeletal: no arthritis, no joint swelling, no myalgias, no difficulty walking, normal mobility   Skin:   no rash, no itching, no known skin infections, no pressure sores or ulcerations  Psych:   no anxiety, no depression, no nervousness, no unusual recent stress  Eyes:   no blurry vision, no floaters, no recent vision changes, no wears glasses or contacts  ENT:   no hearing loss, no loose or painful teeth, no dentures  Hematologic:  no easy bruising, no abnormal bleeding, no clotting disorder, no frequent epistaxis  Endocrine:  no diabetes, does not check CBG's at home     Physical Exam:   BP 130/71   Pulse 83   Resp 16   Ht 5\' 8"  (1.727 m)   Wt 178 lb (80.7 kg)   SpO2 96% Comment: ON RA  BMI 27.06 kg/m   General:    well-appearing  HEENT:  Unremarkable   Neck:   no JVD, no bruits, no adenopathy   Chest:   clear to auscultation, symmetrical breath sounds, no wheezes, no rhonchi, positive swelling and tenderness without surrounding erythema over the inferior aspect of the patient's previous sternotomy scar. There is no sensation of motion or clicking with palpation  CV:   RRR, no murmur   Abdomen:  soft, non-tender, no masses   Extremities:  warm, well-perfused, pulses palpable, no LE  edema  Rectal/GU  Deferred  Neuro:   Grossly non-focal and symmetrical throughout  Skin:   Clean and dry, no rashes, no breakdown   Diagnostic Tests:  CHEST  2 VIEW  COMPARISON:  05/07/2013.  FINDINGS: Prior CABG. Sternal wires appear to be intact. Heart size normal. No focal infiltrate. No pleural effusion or pneumothorax. No acute bony abnormality.  IMPRESSION: Prior CABG. Sternal wires appear to be intact. No acute cardiopulmonary disease identified.   Electronically Signed   By: Marcello Moores  Register   On: 03/06/2017 12:44   Impression:  Patient has a palpable sternal wire that is protruding along the infra aspect of his previous sternotomy scar.   I suspect that the wire has become more prominent in protruded through the skin because of the patient's recent reported weight loss with loss of surrounding soft tissue in this area. According to the patient the wire was visible previously, although currently with surrounding swelling the wire itself does not appear to be breaking through the skin. This area is tender although there is no surrounding erythema. There is no sensation of clicking or motion to suggest fracture of the sternal wire or the sternum itself.  There is likely surrounding cellulitis. The protruding sternal wire should be removed.   Plan:  I have discussed the indications, risks, potential benefits of sternal wire removal with the patient in the office today.  Expectations for his postoperative convalescence at been discussed, including the fact that his physical recovery and return to work will depend upon intraoperative findings. We will obtain baseline blood cultures and complete blood count to look for signs of systemic infection. The patient will undergo noncontrast CT scan of the chest to rule out occult fracture of the sternum. We tentatively plan to proceed with sternal wire removal on 03/13/2017. All of his questions have  been addressed.   I spent in  excess of 45 minutes during the conduct of this office consultation and >50% of this time involved direct face-to-face encounter with the patient for counseling and/or coordination of their care.    Valentina Gu. Roxy Manns, MD 03/06/2017 2:10 PM

## 2017-03-07 ENCOUNTER — Ambulatory Visit
Admission: RE | Admit: 2017-03-07 | Discharge: 2017-03-07 | Disposition: A | Discharge status: Home / Self Care | Payer: Medicare Other | Source: Ambulatory Visit | Attending: Thoracic Surgery (Cardiothoracic Vascular Surgery) | Admitting: Thoracic Surgery (Cardiothoracic Vascular Surgery)

## 2017-03-07 DIAGNOSIS — R918 Other nonspecific abnormal finding of lung field: Secondary | ICD-10-CM | POA: Diagnosis not present

## 2017-03-07 DIAGNOSIS — R0789 Other chest pain: Secondary | ICD-10-CM

## 2017-03-08 NOTE — Pre-Procedure Instructions (Signed)
ELAND LAMANTIA  03/08/2017      [id number] - [place], [place] - [address] [place] [address] Phone: [phone] Fax: [phone]    Your procedure is scheduled on Tuesday, April 24.  Report to [facility] Admitting at 5:30 AM                 Your surgery or procedure is scheduled for 7:30 AM   Call this number if you have problems the morning of surgery:[phone]                 For any other questions, please call [phone], Monday - Friday 8 AM - 4 PM.   Remember:  Do not eat food or drink liquids after midnight Monday, April 23.  Take these medicines the morning of surgery with A SIP OF WATER: metoprolol succinate (TOPROL-XL).                  Aspirin - as instructed by Dr Roxy Manns   Special instructions:   [facility]- Preparing For Surgery  Before surgery, you can play an important role. Because skin is not sterile, your skin needs to be as free of germs as possible. You can reduce the number of germs on your skin by washing with CHG (chlorahexidine gluconate) Soap before surgery.  CHG is an antiseptic cleaner which kills germs and bonds with the skin to continue killing germs even after washing.  Please do not use if you have an allergy to CHG or antibacterial soaps. If your skin becomes reddened/irritated stop using the CHG.  Do not shave (including legs and underarms) for at least 48 hours prior to first CHG shower. It is OK to shave your face.  Please follow these instructions carefully.   1. Shower the NIGHT BEFORE SURGERY and the MORNING OF SURGERY with CHG.   2. If you chose to wash your hair, wash your hair first as usual with your normal shampoo.  3. After you shampoo, rinse your hair and body thoroughly to remove the shampoo.  4. Use CHG as you would any other liquid soap. You can apply CHG directly to the skin and wash gently with a scrungie or a clean washcloth.   5. Apply the CHG Soap to  your body ONLY FROM THE NECK DOWN.  Do not use on open wounds or open sores. Avoid contact with your eyes, ears, mouth and genitals (private parts). Wash genitals (private parts) with your normal soap.  6. Wash thoroughly, paying special attention to the area where your surgery will be performed.  7. Thoroughly rinse your body with warm water from the neck down.  8. DO NOT shower/wash with your normal soap after using and rinsing off the CHG Soap.  9. Pat yourself dry with a CLEAN TOWEL.   10. Wear CLEAN PAJAMAS   11. Place CLEAN SHEETS on your bed the night of your first shower and DO NOT SLEEP WITH PETS.  Day of Surgery: Do not apply any deodorants/lotionspowders, or perfumes,.  Please wear clean clothes to the hospital/surgery center.    Donot wear jewelry, make-up or nail polish.  Do not wear lotions, powders, or perfumes, or deoderant.  Do not shave 48 hours prior to surgery.  Men may shave face and neck.  Do not bring valuables to the hospital.  [facility] is not responsible for any belongings or valuables.  Contacts, dentures or bridgework may  not be worn into surgery.  Leave your suitcase in the car.  After surgery it may be brought to your room.  For patients admitted to the hospital, discharge time will be determined by your treatment team.  Patients discharged the day of surgery will not be allowed to drive home.   Name and phone number of your driver:    Please read over the following fact sheets that you were given:  Patient Instructions for Mupirocin Application, Coughing and Deep Breathing, Pain Booklet, Surgical Site Infections

## 2017-03-09 ENCOUNTER — Encounter (HOSPITAL_COMMUNITY)
Admission: RE | Admit: 2017-03-09 | Discharge: 2017-03-09 | Disposition: A | Discharge status: Home / Self Care | Payer: BLUE CROSS/BLUE SHIELD | Source: Ambulatory Visit | Attending: Thoracic Surgery (Cardiothoracic Vascular Surgery) | Admitting: Thoracic Surgery (Cardiothoracic Vascular Surgery)

## 2017-03-09 ENCOUNTER — Encounter (HOSPITAL_COMMUNITY): Admission: RE | Admit: 2017-03-09 | Payer: BLUE CROSS/BLUE SHIELD | Source: Ambulatory Visit

## 2017-03-09 ENCOUNTER — Encounter (HOSPITAL_COMMUNITY): Payer: Self-pay

## 2017-03-09 DIAGNOSIS — Y839 Surgical procedure, unspecified as the cause of abnormal reaction of the patient, or of later complication, without mention of misadventure at the time of the procedure: Secondary | ICD-10-CM | POA: Diagnosis not present

## 2017-03-09 DIAGNOSIS — Z01818 Encounter for other preprocedural examination: Secondary | ICD-10-CM | POA: Insufficient documentation

## 2017-03-09 DIAGNOSIS — I447 Left bundle-branch block, unspecified: Secondary | ICD-10-CM | POA: Diagnosis not present

## 2017-03-09 DIAGNOSIS — Z01812 Encounter for preprocedural laboratory examination: Secondary | ICD-10-CM | POA: Insufficient documentation

## 2017-03-09 DIAGNOSIS — Z951 Presence of aortocoronary bypass graft: Secondary | ICD-10-CM | POA: Insufficient documentation

## 2017-03-09 DIAGNOSIS — T85898A Other specified complication of other internal prosthetic devices, implants and grafts, initial encounter: Secondary | ICD-10-CM | POA: Insufficient documentation

## 2017-03-09 DIAGNOSIS — R0789 Other chest pain: Secondary | ICD-10-CM

## 2017-03-09 DIAGNOSIS — Z0183 Encounter for blood typing: Secondary | ICD-10-CM | POA: Insufficient documentation

## 2017-03-09 LAB — CBC WITH DIFFERENTIAL/PLATELET
BASOS ABS: 0 10*3/uL (ref 0.0–0.1)
Basophils Relative: 0 %
Eosinophils Absolute: 0.4 10*3/uL (ref 0.0–0.7)
Eosinophils Relative: 4 %
HEMATOCRIT: 41.7 % (ref 39.0–52.0)
Hemoglobin: 14.1 g/dL (ref 13.0–17.0)
LYMPHS PCT: 29 %
Lymphs Abs: 3 10*3/uL (ref 0.7–4.0)
MCH: 31 pg (ref 26.0–34.0)
MCHC: 33.8 g/dL (ref 30.0–36.0)
MCV: 91.6 fL (ref 78.0–100.0)
MONO ABS: 0.6 10*3/uL (ref 0.1–1.0)
Monocytes Relative: 6 %
NEUTROS ABS: 6.2 10*3/uL (ref 1.7–7.7)
Neutrophils Relative %: 61 %
Platelets: 218 10*3/uL (ref 150–400)
RBC: 4.55 MIL/uL (ref 4.22–5.81)
RDW: 12.8 % (ref 11.5–15.5)
WBC: 10.3 10*3/uL (ref 4.0–10.5)

## 2017-03-09 LAB — TYPE AND SCREEN
ABO/RH(D): A POS
ANTIBODY SCREEN: NEGATIVE

## 2017-03-09 LAB — PROTIME-INR
INR: 1.01
Prothrombin Time: 13.3 seconds (ref 11.4–15.2)

## 2017-03-09 LAB — COMPREHENSIVE METABOLIC PANEL
ALK PHOS: 90 U/L (ref 38–126)
ALT: 19 U/L (ref 17–63)
AST: 24 U/L (ref 15–41)
Albumin: 3.9 g/dL (ref 3.5–5.0)
Anion gap: 10 (ref 5–15)
BILIRUBIN TOTAL: 0.7 mg/dL (ref 0.3–1.2)
BUN: 13 mg/dL (ref 6–20)
CALCIUM: 9.5 mg/dL (ref 8.9–10.3)
CO2: 24 mmol/L (ref 22–32)
Chloride: 105 mmol/L (ref 101–111)
Creatinine, Ser: 0.84 mg/dL (ref 0.61–1.24)
GFR calc Af Amer: >60 mL/min (ref >60)
GFR calc non Af Amer: >60 mL/min (ref >60)
GLUCOSE: 112 mg/dL — AB (ref 65–99)
Potassium: 4 mmol/L (ref 3.5–5.1)
Sodium: 139 mmol/L (ref 135–145)
TOTAL PROTEIN: 6.8 g/dL (ref 6.5–8.1)

## 2017-03-09 LAB — SURGICAL PCR SCREEN
MRSA, PCR: NEGATIVE
Staphylococcus aureus: POSITIVE — AB

## 2017-03-09 LAB — ABO/RH: ABO/RH(D): A POS

## 2017-03-09 LAB — APTT: aPTT: 29 seconds (ref 24–36)

## 2017-03-12 ENCOUNTER — Encounter (HOSPITAL_COMMUNITY): Payer: Self-pay | Admitting: Anesthesiology

## 2017-03-12 NOTE — Anesthesia Preprocedure Evaluation (Addendum)
Anesthesia Evaluation  Patient identified by MRN, date of birth, ID band Patient awake    Airway Mallampati: I  TM Distance: >3 FB Neck ROM: Full    Dental   Pulmonary neg pulmonary ROS, former smoker,    Pulmonary exam normal        Cardiovascular hypertension, Pt. on medications and Pt. on home beta blockers + CAD and + CABG  Normal cardiovascular exam+ dysrhythmias      Neuro/Psych negative neurological ROS     GI/Hepatic Neg liver ROS, GERD  ,  Endo/Other  negative endocrine ROS  Renal/GU negative Renal ROS     Musculoskeletal  (+) Arthritis , Osteoarthritis,    Abdominal   Peds  Hematology negative hematology ROS (+)   Anesthesia Other Findings Day of surgery medications reviewed with the patient.  Reproductive/Obstetrics                            Anesthesia Physical Anesthesia Plan  ASA: III  Anesthesia Plan: MAC   Post-op Pain Management:    Induction: Intravenous  Airway Management Planned: Natural Airway  Additional Equipment:   Intra-op Plan: Utilization of Controlled Hypotension per surrgeon request  Post-operative Plan:   Informed Consent: I have reviewed the patients History and Physical, chart, labs and discussed the procedure including the risks, benefits and alternatives for the proposed anesthesia with the patient or authorized representative who has indicated his/her understanding and acceptance.   Dental advisory given  Plan Discussed with: CRNA and Surgeon  Anesthesia Plan Comments:        Anesthesia Quick Evaluation

## 2017-03-13 ENCOUNTER — Ambulatory Visit (HOSPITAL_COMMUNITY): Payer: BLUE CROSS/BLUE SHIELD | Admitting: Certified Registered Nurse Anesthetist

## 2017-03-13 ENCOUNTER — Encounter (HOSPITAL_COMMUNITY)
Admission: RE | Disposition: A | Discharge status: Home / Self Care | Payer: Self-pay | Source: Ambulatory Visit | Attending: Thoracic Surgery (Cardiothoracic Vascular Surgery)

## 2017-03-13 ENCOUNTER — Encounter (HOSPITAL_COMMUNITY): Payer: Self-pay

## 2017-03-13 ENCOUNTER — Ambulatory Visit (HOSPITAL_COMMUNITY)
Admission: RE | Admit: 2017-03-13 | Discharge: 2017-03-13 | Disposition: A | Discharge status: Home / Self Care | Payer: BLUE CROSS/BLUE SHIELD | Source: Ambulatory Visit | Attending: Thoracic Surgery (Cardiothoracic Vascular Surgery) | Admitting: Thoracic Surgery (Cardiothoracic Vascular Surgery)

## 2017-03-13 DIAGNOSIS — Z7982 Long term (current) use of aspirin: Secondary | ICD-10-CM | POA: Diagnosis not present

## 2017-03-13 DIAGNOSIS — I10 Essential (primary) hypertension: Secondary | ICD-10-CM | POA: Diagnosis not present

## 2017-03-13 DIAGNOSIS — Z87891 Personal history of nicotine dependence: Secondary | ICD-10-CM | POA: Diagnosis not present

## 2017-03-13 DIAGNOSIS — I447 Left bundle-branch block, unspecified: Secondary | ICD-10-CM | POA: Insufficient documentation

## 2017-03-13 DIAGNOSIS — K219 Gastro-esophageal reflux disease without esophagitis: Secondary | ICD-10-CM | POA: Diagnosis not present

## 2017-03-13 DIAGNOSIS — R0789 Other chest pain: Secondary | ICD-10-CM

## 2017-03-13 DIAGNOSIS — Z79899 Other long term (current) drug therapy: Secondary | ICD-10-CM | POA: Insufficient documentation

## 2017-03-13 DIAGNOSIS — T84498A Other mechanical complication of other internal orthopedic devices, implants and grafts, initial encounter: Secondary | ICD-10-CM | POA: Diagnosis not present

## 2017-03-13 DIAGNOSIS — Z951 Presence of aortocoronary bypass graft: Secondary | ICD-10-CM | POA: Insufficient documentation

## 2017-03-13 DIAGNOSIS — I2510 Atherosclerotic heart disease of native coronary artery without angina pectoris: Secondary | ICD-10-CM | POA: Insufficient documentation

## 2017-03-13 DIAGNOSIS — Y838 Other surgical procedures as the cause of abnormal reaction of the patient, or of later complication, without mention of misadventure at the time of the procedure: Secondary | ICD-10-CM | POA: Diagnosis not present

## 2017-03-13 DIAGNOSIS — T84218A Breakdown (mechanical) of internal fixation device of other bones, initial encounter: Secondary | ICD-10-CM | POA: Diagnosis not present

## 2017-03-13 DIAGNOSIS — T8189XA Other complications of procedures, not elsewhere classified, initial encounter: Secondary | ICD-10-CM | POA: Diagnosis present

## 2017-03-13 HISTORY — PX: STERNAL WIRES REMOVAL: SHX2441

## 2017-03-13 SURGERY — REMOVAL, STERNAL WIRE
Anesthesia: Monitor Anesthesia Care

## 2017-03-13 MED ORDER — BUPIVACAINE HCL (PF) 0.5 % IJ SOLN
INTRAMUSCULAR | Status: DC | PRN
  Administered 2017-03-13: 5 mL

## 2017-03-13 MED ORDER — MIDAZOLAM HCL 5 MG/5ML IJ SOLN
INTRAMUSCULAR | Status: DC | PRN
  Administered 2017-03-13: 2 mg via INTRAVENOUS

## 2017-03-13 MED ORDER — BUPIVACAINE HCL (PF) 0.5 % IJ SOLN
INTRAMUSCULAR | Status: AC
  Filled 2017-03-13: qty 30

## 2017-03-13 MED ORDER — LIDOCAINE HCL (CARDIAC) 20 MG/ML IV SOLN
INTRAVENOUS | Status: DC | PRN
  Administered 2017-03-13: 60 mg via INTRATRACHEAL

## 2017-03-13 MED ORDER — PROPOFOL 500 MG/50ML IV EMUL
INTRAVENOUS | Status: DC | PRN
Start: 2017-03-13 — End: 2017-03-13
  Administered 2017-03-13: 100 ug/kg/min via INTRAVENOUS

## 2017-03-13 MED ORDER — LIDOCAINE HCL 1 % IJ SOLN
INTRAMUSCULAR | Status: AC
  Filled 2017-03-13: qty 20

## 2017-03-13 MED ORDER — DEXTROSE 5 % IV SOLN
INTRAVENOUS | Status: AC
  Filled 2017-03-13: qty 1.5

## 2017-03-13 MED ORDER — FENTANYL CITRATE (PF) 250 MCG/5ML IJ SOLN
INTRAMUSCULAR | Status: AC
  Filled 2017-03-13: qty 5

## 2017-03-13 MED ORDER — LACTATED RINGERS IV SOLN
INTRAVENOUS | Status: DC | PRN
Start: 2017-03-13 — End: 2017-03-13
  Administered 2017-03-13: 07:00:00 via INTRAVENOUS

## 2017-03-13 MED ORDER — CEFUROXIME SODIUM 1.5 G IJ SOLR
1.5000 g | INTRAMUSCULAR | Status: AC
  Administered 2017-03-13: 1.5 g via INTRAVENOUS

## 2017-03-13 MED ORDER — PROPOFOL 10 MG/ML IV BOLUS
INTRAVENOUS | Status: DC | PRN
  Administered 2017-03-13: 30 mg via INTRAVENOUS

## 2017-03-13 MED ORDER — FENTANYL CITRATE (PF) 100 MCG/2ML IJ SOLN
INTRAMUSCULAR | Status: DC | PRN
  Administered 2017-03-13: 50 ug via INTRAVENOUS
  Administered 2017-03-13 (×2): 100 ug via INTRAVENOUS

## 2017-03-13 MED ORDER — SODIUM CHLORIDE 0.9 % IV SOLN: INTRAVENOUS | Status: DC | PRN

## 2017-03-13 MED ORDER — ONDANSETRON HCL 4 MG/2ML IJ SOLN
INTRAMUSCULAR | Status: DC | PRN
  Administered 2017-03-13: 4 mg via INTRAVENOUS

## 2017-03-13 MED ORDER — MIDAZOLAM HCL 2 MG/2ML IJ SOLN
INTRAMUSCULAR | Status: AC
  Filled 2017-03-13: qty 2

## 2017-03-13 MED ORDER — TRAMADOL HCL 50 MG PO TABS
50.0000 mg | ORAL_TABLET | Freq: Three times a day (TID) | ORAL | 0 refills | Status: DC | PRN
Start: 2017-03-13 — End: 2017-04-03

## 2017-03-13 MED ORDER — 0.9 % SODIUM CHLORIDE (POUR BTL) OPTIME
TOPICAL | Status: DC | PRN
  Administered 2017-03-13: 2000 mL

## 2017-03-13 SURGICAL SUPPLY — 47 items
ATTRACTOMAT 16X20 MAGNETIC DRP (DRAPES) ×1 IMPLANT
BAG DECANTER FOR FLEXI CONT (MISCELLANEOUS) ×1 IMPLANT
BLADE SURG 10 STRL SS (BLADE) ×3 IMPLANT
BNDG GAUZE ELAST 4 BULKY (GAUZE/BANDAGES/DRESSINGS) IMPLANT
CANISTER SUCT 3000ML PPV (MISCELLANEOUS) ×2 IMPLANT
CATH THORACIC 28FR RT ANG (CATHETERS) IMPLANT
CATH THORACIC 36FR (CATHETERS) IMPLANT
CATH THORACIC 36FR RT ANG (CATHETERS) IMPLANT
CLIP TI WIDE RED SMALL 24 (CLIP) IMPLANT
CONT SPEC 4OZ CLIKSEAL STRL BL (MISCELLANEOUS) IMPLANT
DRAPE LAPAROSCOPIC ABDOMINAL (DRAPES) ×2 IMPLANT
DRAPE WARM FLUID 44X44 (DRAPE) IMPLANT
ELECT REM PT RETURN 9FT ADLT (ELECTROSURGICAL) ×2
ELECTRODE REM PT RTRN 9FT ADLT (ELECTROSURGICAL) ×1 IMPLANT
GAUZE SPONGE 4X4 12PLY STRL (GAUZE/BANDAGES/DRESSINGS) ×2 IMPLANT
GAUZE XEROFORM 5X9 LF (GAUZE/BANDAGES/DRESSINGS) IMPLANT
GOWN STRL REUS W/ TWL LRG LVL3 (GOWN DISPOSABLE) ×4 IMPLANT
GOWN STRL REUS W/TWL LRG LVL3 (GOWN DISPOSABLE) ×4
HANDPIECE INTERPULSE COAX TIP (DISPOSABLE)
HEMOSTAT POWDER SURGIFOAM 1G (HEMOSTASIS) IMPLANT
KIT BASIN OR (CUSTOM PROCEDURE TRAY) ×2 IMPLANT
KIT ROOM TURNOVER OR (KITS) ×2 IMPLANT
KIT SUCTION CATH 14FR (SUCTIONS) IMPLANT
NDL HYPO 25GX1X1/2 BEV (NEEDLE) IMPLANT
NEEDLE HYPO 25GX1X1/2 BEV (NEEDLE) ×2 IMPLANT
NS IRRIG 1000ML POUR BTL (IV SOLUTION) ×3 IMPLANT
PACK CHEST (CUSTOM PROCEDURE TRAY) ×2 IMPLANT
PAD ARMBOARD 7.5X6 YLW CONV (MISCELLANEOUS) ×3 IMPLANT
SET HNDPC FAN SPRY TIP SCT (DISPOSABLE) IMPLANT
SOL PREP POV-IOD 4OZ 10% (MISCELLANEOUS) IMPLANT
SPONGE LAP 18X18 X RAY DECT (DISPOSABLE) ×1 IMPLANT
SUT ETHILON 3 0 FSL (SUTURE) IMPLANT
SUT MNCRL AB 3-0 PS2 18 (SUTURE) ×1 IMPLANT
SUT PDS AB 1 CTX 36 (SUTURE) ×1 IMPLANT
SUT STEEL 6MS V (SUTURE) IMPLANT
SUT STEEL STERNAL CCS#1 18IN (SUTURE) IMPLANT
SUT STEEL SZ 6 DBL 3X14 BALL (SUTURE) IMPLANT
SUT VIC AB 2-0 CTX 27 (SUTURE) IMPLANT
SWAB COLLECTION DEVICE MRSA (MISCELLANEOUS) IMPLANT
SWAB CULTURE ESWAB REG 1ML (MISCELLANEOUS) IMPLANT
SYR CONTROL 10ML LL (SYRINGE) ×1 IMPLANT
SYSTEM SAHARA CHEST DRAIN ATS (WOUND CARE) ×1 IMPLANT
TAPE CLOTH SURG 4X10 WHT LF (GAUZE/BANDAGES/DRESSINGS) ×1 IMPLANT
TOWEL OR 17X24 6PK STRL BLUE (TOWEL DISPOSABLE) ×1 IMPLANT
TOWEL OR 17X26 10 PK STRL BLUE (TOWEL DISPOSABLE) ×2 IMPLANT
TRAY FOLEY IC TEMP SENS 14FR (CATHETERS) ×1 IMPLANT
WATER STERILE IRR 1000ML POUR (IV SOLUTION) ×2 IMPLANT

## 2017-03-13 NOTE — Anesthesia Procedure Notes (Signed)
Procedure Name: MAC Date/Time: 03/13/2017 7:45 AM Performed by: Jacquiline Doe A Pre-anesthesia Checklist: Patient identified, Emergency Drugs available, Suction available and Patient being monitored Patient Re-evaluated:Patient Re-evaluated prior to inductionOxygen Delivery Method: Simple face mask Intubation Type: IV induction Placement Confirmation: positive ETCO2 Dental Injury: Teeth and Oropharynx as per pre-operative assessment

## 2017-03-13 NOTE — H&P (View-Only) (Signed)
[address]       [place] [address]             [phone]     CARDIOTHORACIC SURGERY CONSULTATION REPORT  Referring Provider is Redmond School, MD  Primary Cardiologist is Troy Sine, MD PCP is Glo Herring, MD  Chief Complaint  Patient presents with  . Routine Post Op    exposed sternal wire for about a year.Marland KitchenGOES IN AND OUT .Marland Kitchen.CXR.....s/p CABG 2001/[facility]    HPI:  Patient is a 71 year old male with history of coronary artery disease status post coronary artery bypass grafting 3 in 2001 for severe 2 vessel coronary artery disease, hypertension, and hyperlipidemia who is been referred for surgical consultation of recently discovered protruding sternal wire with a tender painful lower sternum. The patient states that he has done well from a cardiac standpoint ever since his heart surgery in 2001. He underwent a follow-up catheterization several years later because of atypical chest pain, and all of his bypass grafts were patent. He has been followed regularly ever since by Dr. Claiborne Billings. Over the past year he made an effort to lose some weight and lost approximately 20 pounds. He began to notice a sternal wire protruding through the skin at the inferior aspect of his previous sternotomy. 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 He recalls accidentally hitting himself in the chest while he was working, but he denies any associated sensation of sternal clicking or motion. Over the last 2 weeks he has developed tenderness and swelling around this area.  He denies any known fevers although he feels as though he may have had some low-grade fevers. Appetite is good. He denies any exertional chest pain or chest tightness. He has not had any shortness of breath either with activity or at rest.  Past Medical History:  Diagnosis Date  . Arthritis    HANDS HURT.  HX OF NERVE COMPRESSION IN  LOWER BACK THAT CAUSES PAIN -AND LEFT LEG PAIN  . Back pain    PT STATES HE HAS FLARE UPS OF LOWER BACK AND LEFT LEG PAIN -MAYBE A LITTLE NUMBNESS--PT STATES HE WAS TOLD HE HAS A PINCHED NERVE.  Marland Kitchen CAD (coronary artery disease)    DR. [place]; CABG 2001,HEART CATH 2005 -PATENT GRAFTS, "LUMINAL IRREGULARITY IN THE VEIN TO THE DIAGONAL VESSEL.  HIS RCA WHICH WAS NONDOMINANT, HAD 78% STENOSIS."  NUCLEAR STRESS TEST 04/04/12--"ESSENTIALLY UNCHANGED FROM PRIOR STUDY AND SHOWED A SMALL PERFUSION DEFECT IN THE BASAL ANTERIOR AND BASAL ANTEROSEPTAL REGION CONSISTENT WITH PROXIMAL LAD SCAR. "  . Complication of anesthesia    PT STATES SURGERY AT [facility] 2012  SPINAL ATTEMPTED FOR HEMORRHOIDECTOMY AND HERNIA REPAIR.  PT STATES WHEN NEEDLE WAS PUT IN HIS BACK - HE FELT ELECTRICAL SHOCK DOWN LEFT GROIN AND LEFT LEG.  PT STATES THAT HE WAS THEN PUT TO  SLEEP.  . Fever blister 07/25/12   ON UPPER LIP  . GERD (gastroesophageal reflux disease)   . Heart palpitations    occasional   . Hemorrhoid    ARE ITCHING AND UNCOMFORTABLE  . Hypertension   . Left bundle branch block    CHRONIC  . Protruding sternal wires   . 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 Weakness of left arm    slight left  arm weakness      Past Surgical History:  Procedure Laterality Date  . APPENDECTOMY    . CARDIAC CATHETERIZATION  02/2004  . COLONOSCOPY     remote past, can't remember  . COLONOSCOPY N/A 04/20/2014   Dr. Gala Romney- grade 3 hemorrhoids, o/w normal ileocolonoscopy  . CORONARY ARTERY BYPASS GRAFT  01/26/2000  . ELBOW SURGERY     for a chipped bone  . HEMORRHOID SURGERY  07/26/2012   Dr. Dalbert Batman  . HEMORRHOID SURGERY N/A 06/18/2015   Procedure: INTERNAL AND EXTERNAL HEMORRHOIDECTOMY SINGLE CLOUMN;  Surgeon: Fanny Skates, MD;  Location: [facility];  Service: General;  Laterality: N/A;  . HEMORROIDECTOMY     MANY YEARS AGO  . Rt hernia repair in March of 2012     STATES HE ALSO  HAD HEMORRHODECTOMY AT THE SAME TIME    Family History  Problem Relation Age of Onset  . Heart disease Father   . Heart failure Father   . Heart disease Paternal Grandfather   . Colon cancer Neg Hx   . Heart failure Mother   . Heart attack Mother   . Diabetes Mother   . Heart attack Maternal Grandmother     Social History   Social History  . Marital status: Married    Spouse name: N/A  . Number of children: N/A  . Years of education: N/A   Occupational History  . Not on file.   Social History Main Topics  . Smoking status: Former Smoker    Packs/day: 2.00    Years: 10.00  . Smokeless tobacco: Never Used     Comment: QUIT 1991  . Alcohol use No  . Drug use: No  . Sexual activity: Not on file   Other Topics Concern  . Not on file   Social History Narrative  . No narrative on file    Current Outpatient Prescriptions  Medication Sig Dispense Refill  . aspirin EC 81 MG tablet Take 81 mg by mouth at bedtime.     Marland Kitchen losartan (COZAAR) 100 MG tablet Take 100 mg by mouth at bedtime.   0  . metoprolol succinate (TOPROL-XL) 50 MG 24 hr tablet TAKE 1 TABLET BY MOUTH EVERY DAY 90 tablet 2  . simvastatin (ZOCOR) 20 MG tablet Take 1 tablet (20 mg total) by mouth daily at 6 PM. PLEASE CONTACT THE OFFICE FOR ADDITIONAL REFILLS 30 tablet 0   No current facility-administered medications for this visit.     Allergies  Allergen Reactions  . Benadryl [Diphenhydramine] Hives      Review of Systems:   General:  normal appetite, normal energy, no weight gain, + weight loss, possible low grade fever  Cardiac:  no chest pain with exertion, + chest pain at rest, no SOB with exertion, no resting SOB, no PND, no orthopnea, no palpitations, no arrhythmia, no atrial fibrillation, no LE edema, no dizzy spells, no syncope  Respiratory:  no shortness of breath, no home oxygen, no productive cough, no dry cough, no bronchitis, no wheezing, no hemoptysis, no asthma, + pain with inspiration  or cough, no sleep apnea, no CPAP at night  GI:   no difficulty swallowing, no reflux, no frequent heartburn, no hiatal hernia, no abdominal pain, no constipation, no diarrhea, no hematochezia, no hematemesis, no melena  GU:   no dysuria,  no frequency, no urinary tract infection, no hematuria, no enlarged prostate, no kidney stones, no kidney disease  Vascular:  no pain suggestive of claudication, no pain  in feet, no leg cramps, no varicose veins, no DVT, no non-healing foot ulcer  Neuro:   no stroke, no TIA's, no seizures, no headaches, no temporary blindness one eye,  no slurred speech, no peripheral neuropathy, no chronic pain, no instability of gait, no memory/cognitive dysfunction  Musculoskeletal: no arthritis, no joint swelling, no myalgias, no difficulty walking, normal mobility   Skin:   no rash, no itching, no known skin infections, no pressure sores or ulcerations  Psych:   no anxiety, no depression, no nervousness, no unusual recent stress  Eyes:   no blurry vision, no floaters, no recent vision changes, no wears glasses or contacts  ENT:   no hearing loss, no loose or painful teeth, no dentures  Hematologic:  no easy bruising, no abnormal bleeding, no clotting disorder, no frequent epistaxis  Endocrine:  no diabetes, does not check CBG's at home     Physical Exam:   BP 130/71   Pulse 83   Resp 16   Ht 5\' 8"  (1.727 m)   Wt 178 lb (80.7 kg)   SpO2 96% Comment: ON RA  BMI 27.06 kg/m   General:    well-appearing  HEENT:  Unremarkable   Neck:   no JVD, no bruits, no adenopathy   Chest:   clear to auscultation, symmetrical breath sounds, no wheezes, no rhonchi, positive swelling and tenderness without surrounding erythema over the inferior aspect of the patient's previous sternotomy scar. There is no sensation of motion or clicking with palpation  CV:   RRR, no murmur   Abdomen:  soft, non-tender, no masses   Extremities:  warm, well-perfused, pulses palpable, no LE  edema  Rectal/GU  Deferred  Neuro:   Grossly non-focal and symmetrical throughout  Skin:   Clean and dry, no rashes, no breakdown   Diagnostic Tests:  CHEST  2 VIEW  COMPARISON:  05/07/2013.  FINDINGS: Prior CABG. Sternal wires appear to be intact. Heart size normal. No focal infiltrate. No pleural effusion or pneumothorax. No acute bony abnormality.  IMPRESSION: Prior CABG. Sternal wires appear to be intact. No acute cardiopulmonary disease identified.   Electronically Signed   By: Marcello Moores  Register   On: 03/06/2017 12:44   Impression:  Patient has a palpable sternal wire that is protruding along the infra aspect of his previous sternotomy scar.   I suspect that the wire has become more prominent in protruded through the skin because of the patient's recent reported weight loss with loss of surrounding soft tissue in this area. According to the patient the wire was visible previously, although currently with surrounding swelling the wire itself does not appear to be breaking through the skin. This area is tender although there is no surrounding erythema. There is no sensation of clicking or motion to suggest fracture of the sternal wire or the sternum itself.  There is likely surrounding cellulitis. The protruding sternal wire should be removed.   Plan:  I have discussed the indications, risks, potential benefits of sternal wire removal with the patient in the office today.  Expectations for his postoperative convalescence at been discussed, including the fact that his physical recovery and return to work will depend upon intraoperative findings. We will obtain baseline blood cultures and complete blood count to look for signs of systemic infection. The patient will undergo noncontrast CT scan of the chest to rule out occult fracture of the sternum. We tentatively plan to proceed with sternal wire removal on 03/13/2017. All of his questions have  been addressed.   I spent in  excess of 45 minutes during the conduct of this office consultation and >50% of this time involved direct face-to-face encounter with the patient for counseling and/or coordination of their care.    Valentina Gu. Roxy Manns, MD 03/06/2017 2:10 PM

## 2017-03-13 NOTE — Brief Op Note (Signed)
03/13/2017  8:21 AM  PATIENT:  Tyler Lawrence  71 y.o. male  PRE-OPERATIVE DIAGNOSIS:  BROKEN STERNAL WIRE  POST-OPERATIVE DIAGNOSIS:  BROKEN STERNAL WIRE  PROCEDURE:  Procedure(s): STERNAL WIRES REMOVAL (N/A)  SURGEON:  Surgeon(s) and Role:    * Rexene Alberts, MD - Primary  ANESTHESIA:   MAC  EBL:  Total I/O In: 400 [I.V.:400] Out: 5 [Blood:5]  BLOOD ADMINISTERED:none  DRAINS: none   LOCAL MEDICATIONS USED:  BUPIVICAINE   SPECIMEN:  Source of Specimen:  sternal wire  DISPOSITION OF SPECIMEN:  PATHOLOGY  COUNTS:  YES  TOURNIQUET:  * No tourniquets in log *  DICTATION: .Note written in EPIC  PLAN OF CARE: Discharge to home after PACU  PATIENT DISPOSITION:  PACU - hemodynamically stable.   Delay start of Pharmacological VTE agent (>24hrs) due to surgical blood loss or risk of bleeding: n/a  Rexene Alberts, MD 03/13/2017 8:22 AM

## 2017-03-13 NOTE — Anesthesia Postprocedure Evaluation (Signed)
Anesthesia Post Note  Patient: Tyler Lawrence  Procedure(s) Performed: Procedure(s) (LRB): STERNAL WIRES REMOVAL (N/A)  Patient location during evaluation: PACU Anesthesia Type: MAC Level of consciousness: awake and alert Pain management: pain level controlled Vital Signs Assessment: post-procedure vital signs reviewed and stable Respiratory status: spontaneous breathing, nonlabored ventilation, respiratory function stable and patient connected to nasal cannula oxygen Cardiovascular status: stable and blood pressure returned to baseline Anesthetic complications: no       Last Vitals:  Vitals:   03/13/17 0900 03/13/17 0912  BP: 128/77 130/80  Pulse: (!) 57 61  Resp: 15 16  Temp: 36.6 C     Last Pain:  Vitals:   03/13/17 0912  TempSrc:   PainSc: 1                  Taelyn Nemes DAVID

## 2017-03-13 NOTE — Interval H&P Note (Signed)
History and Physical Interval Note:  03/13/2017 6:37 AM  Tyler Lawrence  has presented today for surgery, with the diagnosis of EXPOSED STERNAL WIRE  The various methods of treatment have been discussed with the patient and family. After consideration of risks, benefits and other options for treatment, the patient has consented to  Procedure(s): STERNAL WIRES REMOVAL (N/A) as a surgical intervention .  The patient's history has been reviewed, patient examined, no change in status, stable for surgery.  I have reviewed the patient's chart and labs.  Questions were answered to the patient's satisfaction.     Rexene Alberts

## 2017-03-13 NOTE — Transfer of Care (Signed)
Immediate Anesthesia Transfer of Care Note  Patient: Tyler Lawrence  Procedure(s) Performed: Procedure(s): STERNAL WIRES REMOVAL (N/A)  Patient Location: PACU  Anesthesia Type:MAC  Level of Consciousness: awake, oriented, sedated, patient cooperative and responds to stimulation  Airway & Oxygen Therapy: Patient Spontanous Breathing and Patient connected to face mask oxygen  Post-op Assessment: Report given to RN, Post -op Vital signs reviewed and stable, Patient moving all extremities and Patient moving all extremities X 4  Post vital signs: Reviewed and stable  Last Vitals:  Vitals:   03/13/17 0626 03/13/17 0813  BP: (!) 148/70   Pulse:  (P) 77  Resp: 18 (P) 14  Temp: 37.2 C (P) 36.8 C    Last Pain:  Vitals:   03/13/17 0626  TempSrc: Oral      Patients Stated Pain Goal: 4 (58/06/38 [id number])  Complications: No apparent anesthesia complications

## 2017-03-13 NOTE — Discharge Instructions (Signed)
How to Minimize Scarring After Surgery Scarring is a risk of any surgery that involves cutting the skin (an incision). However, every person scars differently. Factors that affect how you scar include:  Which surgery technique was used.  Where the incision was made on your body.  Your overall health.  Your age.  Your skin. You can reduce scarring by following instructions from your health care provider for care at home after surgery.This includes keeping your incision clean, moist, and protected from the sun. How to minimize scarring after surgery Right After Surgery   Follow instructions from your health care provider about how to take care of your incision. Make sure you:  Wash your hands with soap and water before you change your bandage (dressing). If soap and water are not available, use hand sanitizer.  Change your dressing one time each dayor as told by your health care provider.  Keep your incision clean by gently washing it with soap and water as told by your health care provider. This will help to prevent infection.  If directed, apply antibiotic ointment or petroleum jelly to the incision to keep it moist until it heals fully. You may need to moisten two times per day for about 2 weeks.  Leave stitches (sutures), skin glue, or adhesive strips in place. These skin closures may need to be in place for 2 weeks or longer. If adhesive strip edges start to loosen and curl up, you may trim the loose edges. Do not remove adhesive strips completely unless your health care provider tells you to do that.  Avoid touching or manipulating your incision unless needed. Wash your hands thoroughly before and after you touch your incision.  Get sutures taken out at the scheduled time.  Follow all restrictions, such as limits on exercise or work. What you should do and should not do will depend on where your incision is located. After Your Skin Has Healed   Keep your scar protected  from the sun. Cover the scar with sunscreen that has an SPF (sun protection factor) of 30 or higher. Do not put sunscreen on your scar until it has healed.  Gently massage the scar using a circular motion. This will help to minimize the appearance of the scar. Do this only after the incision has closed and all of the sutures have been removed.  Remember that the scar may appear lighter or darker than your normal skin color. This difference in color should even out with time.  If your scar does not fade or go away with time and you do not like how it looks, consider talking with a plastic surgeon or a dermatologist.  Keep all follow-up visits as told by your health care provider. This is important. Contact a health care provider if:  Your sutures come out before your health care provider said they would.  You have more redness, swelling, or pain around your incision.  You have more fluid or blood coming from your incision.  Your incision feels warm to the touch.  You have pus or a bad smell coming from your incision.  You have a fever.  You think that you are having a reaction to the antibiotic ointment. Get help right away if:  You have bleeding from the incision that does not stop. This information is not intended to replace advice given to you by your health care provider. Make sure you discuss any questions you have with your health care provider. Document Released: 04/26/2010 Document  Revised: 04/13/2016 Document Reviewed: 06/09/2015 Elsevier Interactive Patient Education  2017 [place] not [address] while taking pain medication.  Do not lift anything heavier than 10 lbs with your upper body.  Walk several times a day.

## 2017-03-13 NOTE — Op Note (Signed)
CARDIOTHORACIC SURGERY OPERATIVE NOTE  Date of Procedure:   03/13/2017  Preoperative Diagnosis:  Protruding sternal wire  Postoperative Diagnosis:  same  Procedure:    Sternal wire removal  Surgeon:    Valentina Gu. Roxy Manns, MD  Anesthesia:    MAC with local    DETAILS OF THE OPERATIVE PROCEDURE  The patient is brought to the operating room on the above mentioned date and placed in the supine position on the operating table.  Intravenous antibiotics and intravenous sedation administered by the anesthesia team. The patient's anterior chest is prepared and draped in a sterile manner. A time out procedure is performed. A 1.5 cm vertical midline incision is made immediately overlying the protruding sternal wire. The sternal wire is grasped and untwisted. The sternal wire is removed without need to cut either side. The base of the wound is irrigated with saline solution and debrided with rongeurs. Hemostasis is ascertained. The wound is packed with saline moistened gauze and a dry sterile dressings applied. The patient tolerated the procedure well and is transported to the postanesthesia care unit in stable condition. There are no intraoperative complications.     Valentina Gu. Roxy Manns MD 03/13/2017 8:23 AM

## 2017-03-14 ENCOUNTER — Encounter (HOSPITAL_COMMUNITY): Payer: Self-pay | Admitting: Thoracic Surgery (Cardiothoracic Vascular Surgery)

## 2017-03-14 LAB — CULTURE, BLOOD (ROUTINE X 2)
CULTURE: NO GROWTH
Culture: NO GROWTH
Special Requests: ADEQUATE
Special Requests: ADEQUATE

## 2017-04-03 ENCOUNTER — Ambulatory Visit (INDEPENDENT_AMBULATORY_CARE_PROVIDER_SITE_OTHER): Payer: BLUE CROSS/BLUE SHIELD | Admitting: Cardiovascular Disease

## 2017-04-03 ENCOUNTER — Encounter: Payer: Self-pay | Admitting: Cardiovascular Disease

## 2017-04-03 VITALS — BP 139/78 | HR 61 | Ht 68.0 in | Wt 183.4 lb

## 2017-04-03 DIAGNOSIS — Z951 Presence of aortocoronary bypass graft: Secondary | ICD-10-CM | POA: Diagnosis not present

## 2017-04-03 DIAGNOSIS — I2510 Atherosclerotic heart disease of native coronary artery without angina pectoris: Secondary | ICD-10-CM | POA: Diagnosis not present

## 2017-04-03 DIAGNOSIS — I447 Left bundle-branch block, unspecified: Secondary | ICD-10-CM | POA: Diagnosis not present

## 2017-04-03 DIAGNOSIS — E782 Mixed hyperlipidemia: Secondary | ICD-10-CM | POA: Diagnosis not present

## 2017-04-03 DIAGNOSIS — I10 Essential (primary) hypertension: Secondary | ICD-10-CM
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
## 2017-04-03 NOTE — Patient Instructions (Signed)
NO MEDICATION CHANGES     LABS NEEDED- DO NOT EAT OR DRINK THE MORNING OF THE TEST CMP CBC TSH LIPID    Your physician wants you to follow-up in [place].You will receive a reminder letter in the mail two months in advance. If you don't receive a letter, please call our office to schedule the follow-up appointment.   If you need a refill on your cardiac medications before your next appointment, please call your pharmacy.

## 2017-04-04 LAB — COMPREHENSIVE METABOLIC PANEL
ALBUMIN: 3.9 g/dL (ref 3.6–5.1)
ALT: 12 U/L (ref 9–46)
AST: 14 U/L (ref 10–35)
Alkaline Phosphatase: 73 U/L (ref 40–115)
BUN: 14 mg/dL (ref 7–25)
CHLORIDE: 106 mmol/L (ref 98–110)
CO2: 27 mmol/L (ref 20–31)
Calcium: 9.2 mg/dL (ref 8.6–10.3)
Creat: 0.81 mg/dL (ref 0.70–1.25)
Glucose, Bld: 97 mg/dL (ref 65–99)
POTASSIUM: 4.3 mmol/L (ref 3.5–5.3)
Sodium: 139 mmol/L (ref 135–146)
TOTAL PROTEIN: 6.2 g/dL (ref 6.1–8.1)
Total Bilirubin: 0.8 mg/dL (ref 0.2–1.2)

## 2017-04-04 LAB — CBC
HCT: 42.1 % (ref 38.5–50.0)
Hemoglobin: 14.2 g/dL (ref 13.2–17.1)
MCH: 31.1 pg (ref 27.0–33.0)
MCHC: 33.7 g/dL (ref 32.0–36.0)
MCV: 92.1 fL (ref 80.0–100.0)
MPV: 11 fL (ref 7.5–12.5)
Platelets: 197 10*3/uL (ref 140–400)
RBC: 4.57 MIL/uL (ref 4.20–5.80)
RDW: 13.9 % (ref 11.0–15.0)
WBC: 6.6 10*3/uL (ref 3.8–10.8)

## 2017-04-04 LAB — TSH: TSH: 1.28 m[IU]/L (ref 0.40–4.50)

## 2017-04-04 LAB — LIPID PANEL
CHOL/HDL RATIO: 4 ratio (ref <5.0)
CHOLESTEROL: 128 mg/dL (ref <200)
HDL: 32 mg/dL — AB (ref >40)
LDL Cholesterol: 77 mg/dL (ref <100)
Triglycerides: 97 mg/dL (ref <150)
VLDL: 19 mg/dL (ref <30)

## 2017-04-05 ENCOUNTER — Ambulatory Visit: Payer: Self-pay | Admitting: Cardiovascular Disease

## 2017-04-24 ENCOUNTER — Encounter: Payer: Self-pay | Admitting: *Deleted

## 2017-05-03 DIAGNOSIS — M1712 Unilateral primary osteoarthritis, left knee: Secondary | ICD-10-CM | POA: Diagnosis not present

## 2017-05-22 DIAGNOSIS — E782 Mixed hyperlipidemia: Secondary | ICD-10-CM | POA: Diagnosis not present

## 2017-05-22 DIAGNOSIS — H6121 Impacted cerumen, right ear: Secondary | ICD-10-CM | POA: Diagnosis not present

## 2017-05-22 DIAGNOSIS — H6691 Otitis media, unspecified, right ear: Secondary | ICD-10-CM | POA: Diagnosis not present

## 2017-05-22 DIAGNOSIS — Z1389 Encounter for screening for other disorder: Secondary | ICD-10-CM | POA: Diagnosis not present

## 2017-05-22 DIAGNOSIS — H9201 Otalgia, right ear: Secondary | ICD-10-CM | POA: Diagnosis not present

## 2017-05-22 DIAGNOSIS — M1991 Primary osteoarthritis, unspecified site: Secondary | ICD-10-CM | POA: Diagnosis not present

## 2017-05-22 DIAGNOSIS — I10 Essential (primary) hypertension: Secondary | ICD-10-CM | POA: Diagnosis not present

## 2017-06-07 DIAGNOSIS — M1712 Unilateral primary osteoarthritis, left knee: Secondary | ICD-10-CM | POA: Diagnosis not present

## 2017-07-19 DIAGNOSIS — M1712 Unilateral primary osteoarthritis, left knee: Secondary | ICD-10-CM | POA: Diagnosis not present

## 2017-08-24 DIAGNOSIS — Z23 Encounter for immunization: Secondary | ICD-10-CM | POA: Diagnosis not present

## 2017-10-08 DIAGNOSIS — K5289 Other specified noninfective gastroenteritis and colitis: Secondary | ICD-10-CM | POA: Diagnosis not present

## 2017-10-08 DIAGNOSIS — H6691 Otitis media, unspecified, right ear: Secondary | ICD-10-CM | POA: Diagnosis not present

## 2017-10-08 DIAGNOSIS — E663 Overweight: Secondary | ICD-10-CM | POA: Diagnosis not present

## 2017-10-08 DIAGNOSIS — Z6828 Body mass index (BMI) 28.0-28.9, adult: Secondary | ICD-10-CM | POA: Diagnosis not present

## 2017-11-09 DIAGNOSIS — Z1389 Encounter for screening for other disorder: Secondary | ICD-10-CM | POA: Diagnosis not present

## 2017-11-09 DIAGNOSIS — Z6827 Body mass index (BMI) 27.0-27.9, adult: Secondary | ICD-10-CM | POA: Diagnosis not present

## 2017-11-09 DIAGNOSIS — J069 Acute upper respiratory infection, unspecified: Secondary | ICD-10-CM | POA: Diagnosis not present

## 2017-11-09 DIAGNOSIS — E663 Overweight: Secondary | ICD-10-CM | POA: Diagnosis not present

## 2017-11-15 DIAGNOSIS — H524 Presbyopia: Secondary | ICD-10-CM | POA: Diagnosis not present

## 2017-11-15 DIAGNOSIS — D3132 Benign neoplasm of left choroid: Secondary | ICD-10-CM | POA: Diagnosis not present

## 2017-11-16 ENCOUNTER — Ambulatory Visit (INDEPENDENT_AMBULATORY_CARE_PROVIDER_SITE_OTHER): Payer: BLUE CROSS/BLUE SHIELD | Admitting: Cardiology

## 2017-11-16 ENCOUNTER — Telehealth: Payer: Self-pay | Admitting: Cardiology

## 2017-11-16 ENCOUNTER — Encounter: Payer: Self-pay | Admitting: Cardiology

## 2017-11-16 VITALS — BP 128/74 | HR 66 | Ht 68.0 in | Wt 181.2 lb

## 2017-11-16 DIAGNOSIS — I2510 Atherosclerotic heart disease of native coronary artery without angina pectoris: Secondary | ICD-10-CM | POA: Diagnosis not present

## 2017-11-16 DIAGNOSIS — I10 Essential (primary) hypertension: Secondary | ICD-10-CM

## 2017-11-16 DIAGNOSIS — E782 Mixed hyperlipidemia: Secondary | ICD-10-CM

## 2017-11-16 DIAGNOSIS — I447 Left bundle-branch block, unspecified: Secondary | ICD-10-CM

## 2017-11-16 DIAGNOSIS — Z951 Presence of aortocoronary bypass graft: Secondary | ICD-10-CM | POA: Diagnosis not present

## 2017-11-16 DIAGNOSIS — I517 Cardiomegaly: Secondary | ICD-10-CM

## 2017-11-16 NOTE — Telephone Encounter (Signed)
Pt called and told he would need a Lexiscan, unable to do POET with LBBB.  Kerin Ransom PA-C 11/16/2017 3:40 PM

## 2017-11-16 NOTE — Assessment & Plan Note (Signed)
LDL 77- May 2018. He is on statin Rx

## 2017-11-16 NOTE — Addendum Note (Signed)
Addended by: Vennie Homans on: 11/16/2017 01:45 PM   Modules accepted: Orders

## 2017-11-16 NOTE — Assessment & Plan Note (Signed)
controlled 

## 2017-11-16 NOTE — Assessment & Plan Note (Signed)
LIMA to LAD, SVG to D1, SVG to OM1 2001 Cath in 2005-medical Rx, Myoview 2013 low risk

## 2017-11-16 NOTE — Assessment & Plan Note (Signed)
chronic

## 2017-11-16 NOTE — Patient Instructions (Signed)
Medication Instructions:  Continue current medications  If you need a refill on your cardiac medications before your next appointment, please call your pharmacy.  Labwork: None Ordered   Testing/Procedures: Your physician has requested that you have an exercise tolerance test. For further information please visit [email or web address]. Please also follow instruction sheet, as given.  Special Instructions:  HAPPY NEW YEARS!!  Follow-Up: Your physician wants you to follow-up in: May with Dr Claiborne Billings.    Thank you for choosing [facility] at [facility]!!
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
## 2017-11-21 ENCOUNTER — Ambulatory Visit (HOSPITAL_COMMUNITY)
Admission: RE | Admit: 2017-11-21 | Discharge: 2017-11-21 | Disposition: A | Discharge status: Home / Self Care | Payer: BLUE CROSS/BLUE SHIELD | Source: Ambulatory Visit | Attending: Internal Medicine | Admitting: Internal Medicine

## 2017-11-21 ENCOUNTER — Ambulatory Visit (HOSPITAL_COMMUNITY)
Admission: RE | Admit: 2017-11-21 | Payer: Medicare Other | Source: Ambulatory Visit | Attending: Cardiology | Admitting: Cardiology

## 2017-11-21 DIAGNOSIS — I119 Hypertensive heart disease without heart failure: Secondary | ICD-10-CM | POA: Diagnosis not present

## 2017-11-21 DIAGNOSIS — R9439 Abnormal result of other cardiovascular function study: Secondary | ICD-10-CM | POA: Insufficient documentation

## 2017-11-21 DIAGNOSIS — Z8249 Family history of ischemic heart disease and other diseases of the circulatory system: Secondary | ICD-10-CM | POA: Diagnosis not present

## 2017-11-21 DIAGNOSIS — R5383 Other fatigue: Secondary | ICD-10-CM | POA: Diagnosis not present

## 2017-11-21 DIAGNOSIS — I2510 Atherosclerotic heart disease of native coronary artery without angina pectoris: Secondary | ICD-10-CM | POA: Diagnosis not present

## 2017-11-21 DIAGNOSIS — I447 Left bundle-branch block, unspecified: Secondary | ICD-10-CM | POA: Insufficient documentation

## 2017-11-21 DIAGNOSIS — Z951 Presence of aortocoronary bypass graft: Secondary | ICD-10-CM

## 2017-11-21 DIAGNOSIS — R002 Palpitations: Secondary | ICD-10-CM | POA: Insufficient documentation

## 2017-11-21 DIAGNOSIS — I517 Cardiomegaly: Secondary | ICD-10-CM | POA: Diagnosis not present

## 2017-11-21 DIAGNOSIS — Z87891 Personal history of nicotine dependence: Secondary | ICD-10-CM | POA: Insufficient documentation

## 2017-11-21 LAB — MYOCARDIAL PERFUSION IMAGING
Peak HR: 97 {beats}/min
Rest HR: 51 {beats}/min
SDS: 3
SRS: 2
SSS: 5
TID: 1.14

## 2017-11-21 MED ORDER — REGADENOSON 0.4 MG/5ML IV SOLN
0.4000 mg | Freq: Once | INTRAVENOUS | Status: AC
Start: 2017-11-21 — End: 2017-11-21
  Administered 2017-11-21: 0.4 mg via INTRAVENOUS

## 2017-11-21 MED ORDER — TECHNETIUM TC 99M TETROFOSMIN IV KIT
10.9000 | PACK | Freq: Once | INTRAVENOUS | Status: AC | PRN
  Administered 2017-11-21: 10.9 via INTRAVENOUS
  Filled 2017-11-21: qty 11

## 2017-11-21 MED ORDER — TECHNETIUM TC 99M TETROFOSMIN IV KIT
32.4000 | PACK | Freq: Once | INTRAVENOUS | Status: AC | PRN
  Administered 2017-11-21: 32.4 via INTRAVENOUS
  Filled 2017-11-21: qty 33

## 2017-11-23 ENCOUNTER — Telehealth: Payer: Self-pay | Admitting: Cardiovascular Disease

## 2017-11-23 NOTE — Telephone Encounter (Signed)
Patient made aware that Dr. Claiborne Billings will be reviewing his results and we will get back to him once this has been completed. He verbalized his understanding.   Notes recorded by Erlene Quan, PA-C on 11/23/2017 at 7:45 AM EST Please let the pt know I'll ask Dr Claiborne Billings to review his stress test.

## 2017-11-23 NOTE — Telephone Encounter (Signed)
New Message: ° ° ° ° ° °Pt states he is returning a call °

## 2017-12-14 DIAGNOSIS — H25041 Posterior subcapsular polar age-related cataract, right eye: Secondary | ICD-10-CM | POA: Diagnosis not present

## 2017-12-20 ENCOUNTER — Ambulatory Visit (INDEPENDENT_AMBULATORY_CARE_PROVIDER_SITE_OTHER): Payer: BLUE CROSS/BLUE SHIELD | Admitting: Cardiovascular Disease

## 2017-12-20 ENCOUNTER — Encounter: Payer: Self-pay | Admitting: Cardiovascular Disease

## 2017-12-20 VITALS — BP 136/74 | HR 63 | Ht 68.0 in | Wt 186.0 lb

## 2017-12-20 DIAGNOSIS — I10 Essential (primary) hypertension: Secondary | ICD-10-CM

## 2017-12-20 DIAGNOSIS — E782 Mixed hyperlipidemia: Secondary | ICD-10-CM | POA: Diagnosis not present

## 2017-12-20 DIAGNOSIS — I2510 Atherosclerotic heart disease of native coronary artery without angina pectoris: Secondary | ICD-10-CM | POA: Diagnosis not present

## 2017-12-20 DIAGNOSIS — Z951 Presence of aortocoronary bypass graft: Secondary | ICD-10-CM

## 2017-12-20 DIAGNOSIS — Z79899 Other long term (current) drug therapy: Secondary | ICD-10-CM | POA: Diagnosis not present

## 2017-12-20 DIAGNOSIS — I2583 Coronary atherosclerosis due to lipid rich plaque: Secondary | ICD-10-CM

## 2017-12-20 MED ORDER — AMLODIPINE BESYLATE 2.5 MG PO TABS: 2.5000 mg | ORAL_TABLET | Freq: Every day | ORAL | 3 refills | Status: DC
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
## 2017-12-20 NOTE — Patient Instructions (Signed)
Medication Instructions:  START amlodipine (Norvasc) 2.5 mg daily  Labwork: Please return for FASTING labs (CMET, CBC, Lipid, TSH)  Testing/Procedures: Your physician has requested that you have an echocardiogram. Echocardiography is a painless test that uses sound waves to create images of your heart. It provides your doctor with information about the size and shape of your heart and how well your heart's chambers and valves are working. This procedure takes approximately one hour. There are no restrictions for this procedure.  This will be done at our [facility] location:  [place] wants you to follow-up in: 6 months with Dr. Claiborne Billings.  You will receive a reminder letter in the mail two months in advance. If you don't receive a letter, please call our office to schedule the follow-up appointment.   Any Other Special Instructions Will Be Listed Below (If Applicable).     If you need a refill on your cardiac medications before your next appointment, please call your pharmacy.

## 2017-12-21 DIAGNOSIS — H2511 Age-related nuclear cataract, right eye: Secondary | ICD-10-CM | POA: Diagnosis not present

## 2017-12-21 DIAGNOSIS — H25811 Combined forms of age-related cataract, right eye: Secondary | ICD-10-CM | POA: Diagnosis not present

## 2017-12-22 ENCOUNTER — Encounter: Payer: Self-pay | Admitting: Cardiovascular Disease

## 2018-01-03 ENCOUNTER — Ambulatory Visit (HOSPITAL_COMMUNITY): Payer: BLUE CROSS/BLUE SHIELD | Attending: Internal Medicine

## 2018-01-03 ENCOUNTER — Other Ambulatory Visit: Payer: BLUE CROSS/BLUE SHIELD | Admitting: *Deleted

## 2018-01-03 ENCOUNTER — Other Ambulatory Visit: Payer: Self-pay

## 2018-01-03 DIAGNOSIS — I10 Essential (primary) hypertension: Secondary | ICD-10-CM | POA: Diagnosis not present

## 2018-01-03 DIAGNOSIS — I25810 Atherosclerosis of coronary artery bypass graft(s) without angina pectoris: Secondary | ICD-10-CM | POA: Diagnosis not present

## 2018-01-03 DIAGNOSIS — I420 Dilated cardiomyopathy: Secondary | ICD-10-CM | POA: Insufficient documentation

## 2018-01-03 DIAGNOSIS — I2583 Coronary atherosclerosis due to lipid rich plaque: Secondary | ICD-10-CM

## 2018-01-03 DIAGNOSIS — Z951 Presence of aortocoronary bypass graft: Secondary | ICD-10-CM

## 2018-01-03 DIAGNOSIS — I5030 Unspecified diastolic (congestive) heart failure: Secondary | ICD-10-CM | POA: Insufficient documentation

## 2018-01-03 DIAGNOSIS — E782 Mixed hyperlipidemia: Secondary | ICD-10-CM | POA: Diagnosis not present

## 2018-01-03 DIAGNOSIS — Z79899 Other long term (current) drug therapy: Secondary | ICD-10-CM | POA: Diagnosis not present

## 2018-01-03 DIAGNOSIS — I2510 Atherosclerotic heart disease of native coronary artery without angina pectoris: Secondary | ICD-10-CM

## 2018-01-03 MED ORDER — PERFLUTREN LIPID MICROSPHERE
1.0000 mL | INTRAVENOUS | Status: AC | PRN
  Administered 2018-01-03: 2 mL via INTRAVENOUS

## 2018-01-04 LAB — COMPREHENSIVE METABOLIC PANEL
ALT: 24 IU/L (ref 0–44)
AST: 24 IU/L (ref 0–40)
Albumin/Globulin Ratio: 2 (ref 1.2–2.2)
Albumin: 4.7 g/dL (ref 3.5–4.8)
Alkaline Phosphatase: 86 IU/L (ref 39–117)
BILIRUBIN TOTAL: 0.8 mg/dL (ref 0.0–1.2)
BUN/Creatinine Ratio: 16 (ref 10–24)
BUN: 13 mg/dL (ref 8–27)
CHLORIDE: 102 mmol/L (ref 96–106)
CO2: 23 mmol/L (ref 20–29)
Calcium: 9.3 mg/dL (ref 8.6–10.2)
Creatinine, Ser: 0.8 mg/dL (ref 0.76–1.27)
GFR calc non Af Amer: 91 mL/min/{1.73_m2} (ref >59)
GFR, EST AFRICAN AMERICAN: 105 mL/min/{1.73_m2} (ref >59)
Globulin, Total: 2.4 g/dL (ref 1.5–4.5)
Glucose: 104 mg/dL — ABNORMAL HIGH (ref 65–99)
POTASSIUM: 4.4 mmol/L (ref 3.5–5.2)
SODIUM: 141 mmol/L (ref 134–144)
Total Protein: 7.1 g/dL (ref 6.0–8.5)

## 2018-01-04 LAB — LIPID PANEL
Chol/HDL Ratio: 2.8 ratio (ref 0.0–5.0)
Cholesterol, Total: 121 mg/dL (ref 100–199)
HDL: 43 mg/dL (ref >39)
LDL Calculated: 62 mg/dL (ref 0–99)
Triglycerides: 78 mg/dL (ref 0–149)
VLDL Cholesterol Cal: 16 mg/dL (ref 5–40)

## 2018-01-04 LAB — CBC
Hematocrit: 44.6 % (ref 37.5–51.0)
Hemoglobin: 15.1 g/dL (ref 13.0–17.7)
MCH: 31.7 pg (ref 26.6–33.0)
MCHC: 33.9 g/dL (ref 31.5–35.7)
MCV: 94 fL (ref 79–97)
PLATELETS: 206 10*3/uL (ref 150–379)
RBC: 4.77 x10E6/uL (ref 4.14–5.80)
RDW: 14.1 % (ref 12.3–15.4)
WBC: 9 10*3/uL (ref 3.4–10.8)

## 2018-01-04 LAB — TSH: TSH: 1.24 u[IU]/mL (ref 0.450–4.500)

## 2018-01-11 DIAGNOSIS — J320 Chronic maxillary sinusitis: Secondary | ICD-10-CM | POA: Diagnosis not present

## 2018-01-11 DIAGNOSIS — Z681 Body mass index (BMI) 19 or less, adult: Secondary | ICD-10-CM | POA: Diagnosis not present

## 2018-01-11 DIAGNOSIS — M542 Cervicalgia: Secondary | ICD-10-CM | POA: Diagnosis not present

## 2018-02-08 DIAGNOSIS — H25812 Combined forms of age-related cataract, left eye: Secondary | ICD-10-CM | POA: Diagnosis not present

## 2018-02-08 DIAGNOSIS — H2512 Age-related nuclear cataract, left eye: Secondary | ICD-10-CM | POA: Diagnosis not present

## 2018-03-07 DIAGNOSIS — R1031 Right lower quadrant pain: Secondary | ICD-10-CM | POA: Diagnosis not present

## 2018-03-07 DIAGNOSIS — K4091 Unilateral inguinal hernia, without obstruction or gangrene, recurrent: Secondary | ICD-10-CM | POA: Diagnosis not present

## 2018-03-07 DIAGNOSIS — Z9049 Acquired absence of other specified parts of digestive tract: Secondary | ICD-10-CM | POA: Diagnosis not present

## 2018-03-07 DIAGNOSIS — K432 Incisional hernia without obstruction or gangrene: Secondary | ICD-10-CM | POA: Diagnosis not present

## 2018-03-08 DIAGNOSIS — Z1389 Encounter for screening for other disorder: Secondary | ICD-10-CM | POA: Diagnosis not present

## 2018-03-08 DIAGNOSIS — Z6828 Body mass index (BMI) 28.0-28.9, adult: Secondary | ICD-10-CM | POA: Diagnosis not present

## 2018-03-08 DIAGNOSIS — M1991 Primary osteoarthritis, unspecified site: Secondary | ICD-10-CM | POA: Diagnosis not present

## 2018-03-08 DIAGNOSIS — E663 Overweight: Secondary | ICD-10-CM | POA: Diagnosis not present

## 2018-03-08 DIAGNOSIS — I10 Essential (primary) hypertension: Secondary | ICD-10-CM | POA: Diagnosis not present

## 2018-03-08 DIAGNOSIS — I2510 Atherosclerotic heart disease of native coronary artery without angina pectoris: Secondary | ICD-10-CM | POA: Diagnosis not present

## 2018-03-11 ENCOUNTER — Other Ambulatory Visit: Payer: Self-pay | Admitting: General Surgery

## 2018-03-11 DIAGNOSIS — K432 Incisional hernia without obstruction or gangrene: Secondary | ICD-10-CM

## 2018-03-15 ENCOUNTER — Ambulatory Visit
Admission: RE | Admit: 2018-03-15 | Discharge: 2018-03-15 | Disposition: A | Discharge status: Home / Self Care | Payer: BLUE CROSS/BLUE SHIELD | Source: Ambulatory Visit | Attending: General Surgery | Admitting: General Surgery

## 2018-03-15 DIAGNOSIS — K432 Incisional hernia without obstruction or gangrene: Secondary | ICD-10-CM

## 2018-03-15 DIAGNOSIS — K439 Ventral hernia without obstruction or gangrene: Secondary | ICD-10-CM | POA: Diagnosis not present

## 2018-03-15 MED ORDER — IOPAMIDOL (ISOVUE-300) INJECTION 61%
100.0000 mL | Freq: Once | INTRAVENOUS | Status: AC | PRN
  Administered 2018-03-15: 100 mL via INTRAVENOUS

## 2018-03-21 DIAGNOSIS — M1712 Unilateral primary osteoarthritis, left knee: Secondary | ICD-10-CM | POA: Diagnosis not present

## 2018-03-28 ENCOUNTER — Telehealth: Payer: Self-pay

## 2018-03-28 ENCOUNTER — Other Ambulatory Visit: Payer: Self-pay | Admitting: General Surgery

## 2018-03-28 DIAGNOSIS — K439 Ventral hernia without obstruction or gangrene: Secondary | ICD-10-CM | POA: Diagnosis not present

## 2018-03-28 DIAGNOSIS — Z9889 Other specified postprocedural states: Secondary | ICD-10-CM | POA: Diagnosis not present

## 2018-03-28 DIAGNOSIS — Z951 Presence of aortocoronary bypass graft: Secondary | ICD-10-CM | POA: Diagnosis not present

## 2018-03-28 DIAGNOSIS — Z9049 Acquired absence of other specified parts of digestive tract: Secondary | ICD-10-CM | POA: Diagnosis not present

## 2018-03-28 NOTE — Telephone Encounter (Signed)
   [facility] Pre-operative Risk Assessment    Request for surgical clearance:  1. What type of surgery is being performed? Lap. Repair of Right Spigelian Hernia  2. When is this surgery scheduled? TBD  3. What type of clearance is required (medical clearance vs. Pharmacy clearance to hold med vs. Both)? Both  4. Are there any medications that need to be held prior to surgery and how long? ASA   5. Practice name and name of physician performing surgery? [facility]   6. What is your office phone number [phone]    7.   What is your office fax number [phone] Sade;un Massenburg, LPN  8.   Anesthesia type (None, local, MAC, general) ? General   Meryl Crutch 03/28/2018, 5:25 PM  _________________________________________________________________   (provider comments below)

## 2018-03-29 NOTE — Telephone Encounter (Signed)
   Primary Cardiologist:Thomas Claiborne Billings, MD  Chart reviewed as part of pre-operative protocol coverage. Because of Tyler Lawrence past medical history and time since last visit, he/she will require a follow-up visit in order to better assess preoperative cardiovascular risk.  The patient actually has an appointment scheduled 5/17 with Dr. Claiborne Billings at which time pre-operative clearance can be addressed.   Pre-op covering staff: - Please let patient know to discuss pre-op clearance with Dr. Claiborne Billings at visit - Add modifier to appt note to that appt.  Will route this bundled recommendation to requesting provider via Epic fax function. Please call with questions.   Charlie Pitter, PA-C  03/29/2018, 4:18 PM

## 2018-04-01 NOTE — Telephone Encounter (Signed)
Pt has been made aware that he will need to see Dr. Claiborne Billings before he could be cleared, and that this would be taken care of at his upcoming apt, 04/05/18. Pt verbalized understanding and thanked me for the call.

## 2018-04-03 ENCOUNTER — Encounter: Payer: Self-pay | Admitting: Cardiovascular Disease

## 2018-04-05 ENCOUNTER — Telehealth: Payer: Self-pay | Admitting: *Deleted

## 2018-04-05 ENCOUNTER — Ambulatory Visit (INDEPENDENT_AMBULATORY_CARE_PROVIDER_SITE_OTHER): Payer: BLUE CROSS/BLUE SHIELD | Admitting: Cardiovascular Disease

## 2018-04-05 ENCOUNTER — Encounter: Payer: Self-pay | Admitting: Cardiovascular Disease

## 2018-04-05 VITALS — BP 140/78 | HR 57 | Ht 69.0 in | Wt 185.2 lb

## 2018-04-05 DIAGNOSIS — E782 Mixed hyperlipidemia: Secondary | ICD-10-CM | POA: Diagnosis not present

## 2018-04-05 DIAGNOSIS — I2510 Atherosclerotic heart disease of native coronary artery without angina pectoris: Secondary | ICD-10-CM

## 2018-04-05 DIAGNOSIS — I2583 Coronary atherosclerosis due to lipid rich plaque: Secondary | ICD-10-CM

## 2018-04-05 DIAGNOSIS — I10 Essential (primary) hypertension: Secondary | ICD-10-CM | POA: Diagnosis not present

## 2018-04-05 DIAGNOSIS — Z951 Presence of aortocoronary bypass graft: Secondary | ICD-10-CM | POA: Diagnosis not present

## 2018-04-05 DIAGNOSIS — Z01818 Encounter for other preprocedural examination: Secondary | ICD-10-CM | POA: Diagnosis not present

## 2018-04-05 DIAGNOSIS — I447 Left bundle-branch block, unspecified: Secondary | ICD-10-CM | POA: Diagnosis not present

## 2018-04-05 MED ORDER — AMLODIPINE BESYLATE 5 MG PO TABS: 5.0000 mg | ORAL_TABLET | Freq: Every day | ORAL | 3 refills | Status: DC

## 2018-04-05 NOTE — Patient Instructions (Addendum)
Medication Instructions:  INCREASE amlodipine to 5 mg daily  Follow-Up: Your physician wants you to follow-up in: 6 months with Dr. Claiborne Billings.  You will receive a reminder letter in the mail two months in advance. If you don't receive a letter, please call our office to schedule the follow-up appointment.   Any Other Special Instructions Will Be Listed Below (If Applicable).  We will fax surgical clearance to [facility] today   If you need a refill on your cardiac medications before your next appointment, please call your pharmacy.
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
## 2018-04-05 NOTE — Telephone Encounter (Signed)
Surgical clearance faxed to [facility]

## 2018-04-16 NOTE — Telephone Encounter (Signed)
Follow Up    [facility] calling to request clearance letter be faxed to [phone].

## 2018-04-24 ENCOUNTER — Telehealth: Payer: Self-pay | Admitting: *Deleted

## 2018-04-24 NOTE — Telephone Encounter (Signed)
-----   Message from Cipriano Mile sent at 04/24/2018  9:31 AM EDT ----- Vaughan Sine,  Thank you for the cardiac clearance on above patient.  One question - Should his ASA be held prior to surgery?  I didn't see this addressed in the notice.  Thanks.  Hassan Rowan

## 2018-04-26 NOTE — Telephone Encounter (Signed)
Faxed via Epic to [facility]

## 2018-04-26 NOTE — Telephone Encounter (Signed)
Ok to hod ASA for 5 days

## 2018-05-31 NOTE — Pre-Procedure Instructions (Signed)
Tyler Lawrence  05/31/2018      [id number] [place] [address], [place] [address] [place] [address] Phone: [phone] Fax: [phone]    Your procedure is scheduled on Tuesday July 23.  Report to [facility] Admitting at 6:00 A.M.  Call this number if you have problems the morning of surgery:  [phone]   Remember:  Do not eat or drink after midnight.    Take these medicines the morning of surgery with A SIP OF WATER:   Amlodipine (norvasc) Metoprolol (Toprol XL)  7 days prior to surgery STOP taking any Aleve, Naproxen, Ibuprofen, Motrin, Advil, Goody's, BC's, all herbal medications, fish oil, and all vitamins  **Follow your surgeon's instructions regarding stopping Aspirin. If no instructions were given, please call your surgeon's office**    Do not wear jewelry, make-up or nail polish.  Do not wear lotions, powders, or perfumes, or deodorant.  Do not shave 48 hours prior to surgery.  Men may shave face and neck.  Do not bring valuables to the hospital.  [facility] is not responsible for any belongings or valuables.  Contacts, dentures or bridgework may not be worn into surgery.  Leave your suitcase in the car.  After surgery it may be brought to your room.  For patients admitted to the hospital, discharge time will be determined by your treatment team.  Patients discharged the day of surgery will not be allowed to drive home.   Special instructions:    [facility]- Preparing For Surgery  Before surgery, you can play an important role. Because skin is not sterile, your skin needs to be as free of germs as possible. You can reduce the number of germs on your skin by washing with CHG (chlorahexidine gluconate) Soap before surgery.  CHG is an antiseptic cleaner which kills germs and bonds with the skin to continue killing germs even after washing.    Oral Hygiene is also important to reduce your risk of  infection.  Remember - BRUSH YOUR TEETH THE MORNING OF SURGERY WITH YOUR REGULAR TOOTHPASTE  Please do not use if you have an allergy to CHG or antibacterial soaps. If your skin becomes reddened/irritated stop using the CHG.  Do not shave (including legs and underarms) for at least 48 hours prior to first CHG shower. It is OK to shave your face.  Please follow these instructions carefully.   1. Shower the NIGHT BEFORE SURGERY and the MORNING OF SURGERY with CHG.   2. If you chose to wash your hair, wash your hair first as usual with your normal shampoo.  3. After you shampoo, rinse your hair and body thoroughly to remove the shampoo.  4. Use CHG as you would any other liquid soap. You can apply CHG directly to the skin and wash gently with a scrungie or a clean washcloth.   5. Apply the CHG Soap to your body ONLY FROM THE NECK DOWN.  Do not use on open wounds or open sores. Avoid contact with your eyes, ears, mouth and genitals (private parts). Wash Face and genitals (private parts)  with your normal soap.  6. Wash thoroughly, paying special attention to the area where your surgery will be performed.  7. Thoroughly rinse your body with warm water from the neck down.  8. DO NOT shower/wash with your normal soap after using and rinsing off the CHG Soap.  9. Pat yourself dry with a  CLEAN TOWEL.  10. Wear CLEAN PAJAMAS to bed the night before surgery, wear comfortable clothes the morning of surgery  11. Place CLEAN SHEETS on your bed the night of your first shower and DO NOT SLEEP WITH PETS.    Day of Surgery:  Do not apply any deodorants/lotions.  Please wear clean clothes to the hospital/surgery center.   Remember to brush your teeth WITH YOUR REGULAR TOOTHPASTE.    Please read over the following fact sheets that you were given. Coughing and Deep Breathing and Surgical Site Infection Prevention

## 2018-06-03 ENCOUNTER — Other Ambulatory Visit: Payer: Self-pay

## 2018-06-03 ENCOUNTER — Encounter (HOSPITAL_COMMUNITY)
Admission: RE | Admit: 2018-06-03 | Discharge: 2018-06-03 | Disposition: A | Discharge status: Home / Self Care | Payer: BLUE CROSS/BLUE SHIELD | Source: Ambulatory Visit | Attending: General Surgery | Admitting: General Surgery

## 2018-06-03 ENCOUNTER — Encounter (HOSPITAL_COMMUNITY): Payer: Self-pay

## 2018-06-03 DIAGNOSIS — Z01812 Encounter for preprocedural laboratory examination: Secondary | ICD-10-CM | POA: Insufficient documentation

## 2018-06-03 LAB — COMPREHENSIVE METABOLIC PANEL
ALK PHOS: 63 U/L (ref 38–126)
ALT: 24 U/L (ref 0–44)
AST: 24 U/L (ref 15–41)
Albumin: 3.8 g/dL (ref 3.5–5.0)
Anion gap: 7 (ref 5–15)
BILIRUBIN TOTAL: 1 mg/dL (ref 0.3–1.2)
BUN: 16 mg/dL (ref 8–23)
CALCIUM: 9.4 mg/dL (ref 8.9–10.3)
CO2: 27 mmol/L (ref 22–32)
CREATININE: 0.8 mg/dL (ref 0.61–1.24)
Chloride: 108 mmol/L (ref 98–111)
GFR calc Af Amer: >60 mL/min (ref >60)
Glucose, Bld: 127 mg/dL — ABNORMAL HIGH (ref 70–99)
POTASSIUM: 4.2 mmol/L (ref 3.5–5.1)
Sodium: 142 mmol/L (ref 135–145)
TOTAL PROTEIN: 6.5 g/dL (ref 6.5–8.1)

## 2018-06-03 LAB — CBC WITH DIFFERENTIAL/PLATELET
Abs Immature Granulocytes: 0 10*3/uL (ref 0.0–0.1)
Basophils Absolute: 0.1 10*3/uL (ref 0.0–0.1)
Basophils Relative: 1 %
EOS ABS: 0.4 10*3/uL (ref 0.0–0.7)
EOS PCT: 4 %
HEMATOCRIT: 43 % (ref 39.0–52.0)
Hemoglobin: 14.4 g/dL (ref 13.0–17.0)
IMMATURE GRANULOCYTES: 0 %
LYMPHS ABS: 3 10*3/uL (ref 0.7–4.0)
Lymphocytes Relative: 33 %
MCH: 32.4 pg (ref 26.0–34.0)
MCHC: 33.5 g/dL (ref 30.0–36.0)
MCV: 96.6 fL (ref 78.0–100.0)
MONOS PCT: 8 %
Monocytes Absolute: 0.7 10*3/uL (ref 0.1–1.0)
NEUTROS PCT: 54 %
Neutro Abs: 5 10*3/uL (ref 1.7–7.7)
Platelets: 222 10*3/uL (ref 150–400)
RBC: 4.45 MIL/uL (ref 4.22–5.81)
RDW: 12.8 % (ref 11.5–15.5)
WBC: 9.1 10*3/uL (ref 4.0–10.5)

## 2018-06-03 NOTE — Progress Notes (Signed)
Dr. Shelva Majestic  Cardiologist  Cardiac clearance in Epic   Pt. Denies any recent cardiac problems or symptoms.   PCP Redmond School  MD

## 2018-06-03 NOTE — Pre-Procedure Instructions (Signed)
SHONDALE QUINLEY  06/03/2018      [id number] [place], [place] - [address] [place] [address] Phone: [phone] Fax: [phone]    Your procedure is scheduled on Tuesday July 23.  Report to [facility] Admitting at 6:00 A.M.  Call this number if you have problems the morning of surgery:  [phone]   Remember:  Do not eat or drink after midnight.    Drink the bottle of Pre-surgical Ensure  5:00 AM. Try to drink all at one time do not sip.    Take these medicines the morning of surgery with A SIP OF WATER:   Amlodipine (norvasc) Metoprolol (Toprol XL)  7 days prior to surgery STOP taking any Aleve, Naproxen, Ibuprofen, Motrin, Advil, Goody's, BC's, all herbal medications, fish oil, and all vitamins  **Follow your surgeon's instructions regarding stopping Aspirin. If no instructions were given, please call your surgeon's office**    Do not wear jewelry, make-up or nail polish.  Do not wear lotions, powders, or perfumes, or deodorant.  Do not shave 48 hours prior to surgery.  Men may shave face and neck.  Do not bring valuables to the hospital.  [facility] is not responsible for any belongings or valuables.  Contacts, dentures or bridgework may not be worn into surgery.  Leave your suitcase in the car.  After surgery it may be brought to your room.  For patients admitted to the hospital, discharge time will be determined by your treatment team.  Patients discharged the day of surgery will not be allowed to drive home.   Special instructions:    [facility]- Preparing For Surgery  Before surgery, you can play an important role. Because skin is not sterile, your skin needs to be as free of germs as possible. You can reduce the number of germs on your skin by washing with CHG (chlorahexidine gluconate) Soap before surgery.  CHG is an antiseptic cleaner which kills germs and bonds with the skin to continue  killing germs even after washing.    Oral Hygiene is also important to reduce your risk of infection.  Remember - BRUSH YOUR TEETH THE MORNING OF SURGERY WITH YOUR REGULAR TOOTHPASTE  Please do not use if you have an allergy to CHG or antibacterial soaps. If your skin becomes reddened/irritated stop using the CHG.  Do not shave (including legs and underarms) for at least 48 hours prior to first CHG shower. It is OK to shave your face.  Please follow these instructions carefully.   1. Shower the NIGHT BEFORE SURGERY and the MORNING OF SURGERY with CHG.   2. If you chose to wash your hair, wash your hair first as usual with your normal shampoo.  3. After you shampoo, rinse your hair and body thoroughly to remove the shampoo.  4. Use CHG as you would any other liquid soap. You can apply CHG directly to the skin and wash gently with a scrungie or a clean washcloth.   5. Apply the CHG Soap to your body ONLY FROM THE NECK DOWN.  Do not use on open wounds or open sores. Avoid contact with your eyes, ears, mouth and genitals (private parts). Wash Face and genitals (private parts)  with your normal soap.  6. Wash thoroughly, paying special attention to the area where your surgery will be performed.  7. Thoroughly rinse your body with warm water from the neck down.  8.  DO NOT shower/wash with your normal soap after using and rinsing off the CHG Soap.  9. Pat yourself dry with a CLEAN TOWEL.  10. Wear CLEAN PAJAMAS to bed the night before surgery, wear comfortable clothes the morning of surgery  11. Place CLEAN SHEETS on your bed the night of your first shower and DO NOT SLEEP WITH PETS.    Day of Surgery:  Do not apply any deodorants/lotions.  Please wear clean clothes to the hospital/surgery center.   Remember to brush your teeth WITH YOUR REGULAR TOOTHPASTE.    Please read over the following fact sheets that you were given. Coughing and Deep Breathing and Surgical Site Infection  Prevention

## 2018-06-09 NOTE — H&P (Signed)
Tyler Lawrence Location: [facility] Patient #: [id number] DOB: [birth year] Married / Language: English / Race: White Male        History of Present Illness       This is a very pleasant 72 year old man who returns for management of a right spigelian hernia. Dr. Nona Dell is his cardiologist. Dr. Kerin Perna is his PCP.     I performed hemorrhoidectomy in 2016 with good result. He's had an appendectomy that was done laparoscopically in [place] a few years ago. When I was out on medical leave Dr. Thurman Coyer performed laparoscopic bilateral inguinal hernia repairs with mesh, right side recurrent left side first time repair. He also had an epigastric abdominal wall hernia at an old chest tube site.      He reports right-sided discomfort and bulge for 2 years. He says this is getting worse and is very aggravating it is very difficult for him to work. 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 CT scan shows a widemouth right spigelian hernia. Inguinal hernia repairs are intact. The intestine is seen in the hernia but it is not clear whether it is adherent. Certainly there is no inflammatory change or obstruction. Symptomatically he just has a bulge and progressive discomfort but no obstructive symptoms.  200 bit of disease include CABG. Had recent echocardiogram and stress test and was told that was okay. Inguinal hernia repair 2012 by Dr. Romona Curls. Bilateral inguinal hernia. Dr. gross 2016. Laparoscopic appendectomy by Dr. Arnoldo Morale several years ago.       He says this is aggravating him and he wants to have it repaired. I told him the techniques of the surgery and the temporary disabilities. I told him no heavy lifting or sports for 5 weeks. We agree that he could go back to work and do a sedentary job in 2 weeks but no lifting or telephone pole climbing for 5 weeks. He will need to stay at least 1 night in the hospital. He is going to talk to his boss but  he thinks this will be okay.       He will be scheduled for laparoscopic repair of right spigelian hernia with mesh, possible open. I discussed the indications, details, techniques, numerous risk of the surgery. We will probably use a bean bag to do a lazy decubitus position. I told him that at the adhesions were bad we would have to do this with an open technique but hopefully we could do an underlay technique. I doubt we could do a totally extraperitoneal approach because of the TEP repair of his inguinal hernias. He is well risk of bleeding, infection, conversion to open laparotomy, injury to adjacent organs of major reconstructive surgery, nerve damage with chronic pain, cardiac pulmonary and trilobar problems. He understands all these issues well. All of his questions are answered. He agrees with this plan   Plan: Scheduled for laparoscopic repair of right spigelian hernia with mesh, possible open Cardiac risk assessment and clearance with Dr. Nona Dell Dr. Rosendo Gros is interested in assisting me with this Patient's ongoing to have him help me Right TAPP block   Allergies  Benadryl *ANTIHISTAMINES*  Allergies Reconciled   Medication History  Glucosamine Chondr 500 Complex (Oral daily) Active. Losartan Potassium (100MG  Tablet, Oral daily) Active. Metoprolol Succinate ER (50MG  Tablet ER 24HR, Oral daily) Active. Simvastatin (20MG  Tablet, Oral daily) Active. MiraLax (Oral as needed) Active. Aspirin EC (81MG  Tablet DR, Oral daily) Active. Amlodipine Besy-Benazepril HCl (2.5-10MG   Capsule, Oral) Active. Ecotrin (81MG  Tablet DR, Oral) Active. Medications Reconciled  Vitals Weight: 188.13 lb Height: 69in Body Surface Area: 2.01 m Body Mass Index: 27.78 kg/m  Temp.: 98.55F(Oral)  Pulse: 82 (Regular)  BP: 142/84 (Sitting, Right Arm, Standard)     Physical Exam  General Mental Status-Alert. General Appearance-Not in acute distress. Build &  Nutrition-Well nourished. Posture-Normal posture. Gait-Normal.  Head and Neck Head-normocephalic, atraumatic with no lesions or palpable masses. Trachea-midline. Thyroid Gland Characteristics - normal size and consistency and no palpable nodules.  Chest and Lung Exam Chest and lung exam reveals -on auscultation, normal breath sounds, no adventitious sounds and normal vocal resonance. Note: Sternotomy incision. Chest tube sites noted well-healed   Cardiovascular Cardiovascular examination reveals -normal heart sounds, regular rate and rhythm with no murmurs and femoral artery auscultation bilaterally reveals normal pulses, no bruits, no thrills.  Abdomen Inspection Inspection of the abdomen reveals - Note: When he stands I can feel a reducible bulge in the right lower quadrant. This is consistent with a spigelian hernia. Skin healthy. No inguinal or femoral mass. Other trocar sites noted. Palpation/Percussion Palpation and Percussion of the abdomen reveal - Soft, Non Tender, No Rigidity (guarding), No hepatosplenomegaly and No Palpable abdominal masses.  Neurologic Neurologic evaluation reveals -alert and oriented x 3 with no impairment of recent or remote memory, normal attention span and ability to concentrate, normal sensation and normal coordination.  Musculoskeletal Normal Exam - Bilateral-Upper Extremity Strength Normal and Lower Extremity Strength Normal.    Assessment & Plan  SPIGELIAN HERNIA (K43.9)   Your CT scan shows a wide mouth right spigelian hernia The other hernia repairs are intact There is no sign of any intestinal disease  you state that this is still uncomfortable and you would like something done This surgery can be done electively No sports or lifting more than 20 pounds for 5 weeks You could return to a desk job or sedentary job in 2 weeks  you will be scheduled for laparoscopic repair of right spigelian hernia with mesh, possible  open repair at your convenience I discussed the indications, techniques, and risks of this surgery with you.  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 Dalbert Batman, M.D., [facility] General and Minimally invasive Surgery Breast and Colorectal Surgery Office:   [phone] Pager:   [phone]

## 2018-06-11 ENCOUNTER — Other Ambulatory Visit: Payer: Self-pay

## 2018-06-11 ENCOUNTER — Encounter (HOSPITAL_COMMUNITY)
Admission: RE | Disposition: A | Discharge status: Home / Self Care | Payer: Self-pay | Source: Ambulatory Visit | Attending: General Surgery

## 2018-06-11 ENCOUNTER — Ambulatory Visit (HOSPITAL_COMMUNITY): Payer: BLUE CROSS/BLUE SHIELD | Admitting: Certified Registered"

## 2018-06-11 ENCOUNTER — Encounter (HOSPITAL_COMMUNITY): Payer: Self-pay | Admitting: Certified Registered"

## 2018-06-11 ENCOUNTER — Ambulatory Visit (HOSPITAL_COMMUNITY)
Admission: RE | Admit: 2018-06-11 | Discharge: 2018-06-13 | Disposition: A | Discharge status: Home / Self Care | Payer: BLUE CROSS/BLUE SHIELD | Source: Ambulatory Visit | Attending: General Surgery | Admitting: General Surgery

## 2018-06-11 DIAGNOSIS — Z888 Allergy status to other drugs, medicaments and biological substances status: Secondary | ICD-10-CM | POA: Insufficient documentation

## 2018-06-11 DIAGNOSIS — M1990 Unspecified osteoarthritis, unspecified site: Secondary | ICD-10-CM | POA: Diagnosis not present

## 2018-06-11 DIAGNOSIS — Z9049 Acquired absence of other specified parts of digestive tract: Secondary | ICD-10-CM | POA: Diagnosis not present

## 2018-06-11 DIAGNOSIS — I25810 Atherosclerosis of coronary artery bypass graft(s) without angina pectoris: Secondary | ICD-10-CM | POA: Diagnosis not present

## 2018-06-11 DIAGNOSIS — I2510 Atherosclerotic heart disease of native coronary artery without angina pectoris: Secondary | ICD-10-CM | POA: Diagnosis not present

## 2018-06-11 DIAGNOSIS — K432 Incisional hernia without obstruction or gangrene: Secondary | ICD-10-CM | POA: Diagnosis not present

## 2018-06-11 DIAGNOSIS — Z951 Presence of aortocoronary bypass graft: Secondary | ICD-10-CM | POA: Diagnosis not present

## 2018-06-11 DIAGNOSIS — D72829 Elevated white blood cell count, unspecified: Secondary | ICD-10-CM | POA: Insufficient documentation

## 2018-06-11 DIAGNOSIS — K219 Gastro-esophageal reflux disease without esophagitis: Secondary | ICD-10-CM | POA: Diagnosis not present

## 2018-06-11 DIAGNOSIS — K439 Ventral hernia without obstruction or gangrene: Secondary | ICD-10-CM | POA: Diagnosis present

## 2018-06-11 DIAGNOSIS — I10 Essential (primary) hypertension: Secondary | ICD-10-CM | POA: Diagnosis not present

## 2018-06-11 DIAGNOSIS — K660 Peritoneal adhesions (postprocedural) (postinfection): Secondary | ICD-10-CM | POA: Insufficient documentation

## 2018-06-11 DIAGNOSIS — Z7982 Long term (current) use of aspirin: Secondary | ICD-10-CM | POA: Diagnosis not present

## 2018-06-11 DIAGNOSIS — Z79899 Other long term (current) drug therapy: Secondary | ICD-10-CM | POA: Insufficient documentation

## 2018-06-11 DIAGNOSIS — Z87891 Personal history of nicotine dependence: Secondary | ICD-10-CM | POA: Insufficient documentation

## 2018-06-11 DIAGNOSIS — G8918 Other acute postprocedural pain: Secondary | ICD-10-CM | POA: Diagnosis not present

## 2018-06-11 HISTORY — DX: Ventral hernia without obstruction or gangrene: K43.9

## 2018-06-11 HISTORY — PX: HERNIA REPAIR: SHX51

## 2018-06-11 HISTORY — PX: INSERTION OF MESH: SHX5868

## 2018-06-11 HISTORY — PX: LAPAROSCOPIC ASSISTED SPIGELIAN HERNIA REPAIR: SHX6763

## 2018-06-11 LAB — CBC
HCT: 41.2 % (ref 39.0–52.0)
HEMOGLOBIN: 14 g/dL (ref 13.0–17.0)
MCH: 32.3 pg (ref 26.0–34.0)
MCHC: 34 g/dL (ref 30.0–36.0)
MCV: 95.2 fL (ref 78.0–100.0)
Platelets: 210 10*3/uL (ref 150–400)
RBC: 4.33 MIL/uL (ref 4.22–5.81)
RDW: 12.5 % (ref 11.5–15.5)
WBC: 15.6 10*3/uL — ABNORMAL HIGH (ref 4.0–10.5)

## 2018-06-11 LAB — CREATININE, SERUM
Creatinine, Ser: 1.02 mg/dL (ref 0.61–1.24)
GFR calc non Af Amer: >60 mL/min (ref >60)

## 2018-06-11 SURGERY — REPAIR, HERNIA, SPIGELIAN, LAPAROSCOPIC
Anesthesia: Regional | Site: Abdomen | Laterality: Right

## 2018-06-11 MED ORDER — PROPOFOL 10 MG/ML IV BOLUS
INTRAVENOUS | Status: AC
  Filled 2018-06-11: qty 20

## 2018-06-11 MED ORDER — HYDROMORPHONE HCL 1 MG/ML IJ SOLN
INTRAMUSCULAR | Status: AC
  Filled 2018-06-11: qty 1

## 2018-06-11 MED ORDER — PROMETHAZINE HCL 25 MG/ML IJ SOLN
6.2500 mg | INTRAMUSCULAR | Status: DC | PRN
  Administered 2018-06-11: 6.25 mg via INTRAVENOUS

## 2018-06-11 MED ORDER — LIDOCAINE 2% (20 MG/ML) 5 ML SYRINGE
INTRAMUSCULAR | Status: DC | PRN
  Administered 2018-06-11: 80 mg via INTRAVENOUS

## 2018-06-11 MED ORDER — SENNA 8.6 MG PO TABS
1.0000 | ORAL_TABLET | Freq: Two times a day (BID) | ORAL | Status: DC
  Administered 2018-06-11 – 2018-06-12 (×3): 8.6 mg via ORAL
  Filled 2018-06-11 (×4): qty 1

## 2018-06-11 MED ORDER — AMLODIPINE BESYLATE 5 MG PO TABS
5.0000 mg | ORAL_TABLET | Freq: Every day | ORAL | Status: DC
  Administered 2018-06-12: 5 mg via ORAL
  Filled 2018-06-11: qty 1

## 2018-06-11 MED ORDER — ONDANSETRON HCL 4 MG/2ML IJ SOLN
INTRAMUSCULAR | Status: DC | PRN
  Administered 2018-06-11: 4 mg via INTRAVENOUS

## 2018-06-11 MED ORDER — ONDANSETRON 4 MG PO TBDP: 4.0000 mg | ORAL_TABLET | Freq: Four times a day (QID) | ORAL | Status: DC | PRN

## 2018-06-11 MED ORDER — PROMETHAZINE HCL 25 MG/ML IJ SOLN
INTRAMUSCULAR | Status: AC
  Filled 2018-06-11: qty 1

## 2018-06-11 MED ORDER — GABAPENTIN 300 MG PO CAPS
300.0000 mg | ORAL_CAPSULE | Freq: Two times a day (BID) | ORAL | Status: DC
  Administered 2018-06-11 – 2018-06-12 (×4): 300 mg via ORAL
  Filled 2018-06-11 (×4): qty 1

## 2018-06-11 MED ORDER — BUPIVACAINE-EPINEPHRINE 0.25% -1:200000 IJ SOLN
INTRAMUSCULAR | Status: DC | PRN
  Administered 2018-06-11: 21 mL

## 2018-06-11 MED ORDER — ASPIRIN EC 81 MG PO TBEC
81.0000 mg | DELAYED_RELEASE_TABLET | Freq: Every day | ORAL | Status: DC
  Administered 2018-06-11 – 2018-06-12 (×2): 81 mg via ORAL
  Filled 2018-06-11 (×2): qty 1

## 2018-06-11 MED ORDER — ROCURONIUM BROMIDE 100 MG/10ML IV SOLN
INTRAVENOUS | Status: DC | PRN
Start: 2018-06-11 — End: 2018-06-11
  Administered 2018-06-11: 60 mg via INTRAVENOUS
  Administered 2018-06-11 (×3): 10 mg via INTRAVENOUS

## 2018-06-11 MED ORDER — LIDOCAINE 2% (20 MG/ML) 5 ML SYRINGE
INTRAMUSCULAR | Status: AC
  Filled 2018-06-11: qty 5

## 2018-06-11 MED ORDER — MIDAZOLAM HCL 2 MG/2ML IJ SOLN
INTRAMUSCULAR | Status: AC
  Filled 2018-06-11: qty 2

## 2018-06-11 MED ORDER — PHENYLEPHRINE 40 MCG/ML (10ML) SYRINGE FOR IV PUSH (FOR BLOOD PRESSURE SUPPORT)
PREFILLED_SYRINGE | INTRAVENOUS | Status: AC
  Filled 2018-06-11: qty 10

## 2018-06-11 MED ORDER — ACETAMINOPHEN 500 MG PO TABS
1000.0000 mg | ORAL_TABLET | ORAL | Status: AC
  Administered 2018-06-11: 1000 mg via ORAL
  Filled 2018-06-11: qty 2

## 2018-06-11 MED ORDER — SUGAMMADEX SODIUM 200 MG/2ML IV SOLN
INTRAVENOUS | Status: DC | PRN
  Administered 2018-06-11: 100 mg via INTRAVENOUS

## 2018-06-11 MED ORDER — EVICEL 5 ML EX KIT
PACK | CUTANEOUS | Status: DC | PRN
  Administered 2018-06-11: 5 mL

## 2018-06-11 MED ORDER — FENTANYL CITRATE (PF) 100 MCG/2ML IJ SOLN
INTRAMUSCULAR | Status: DC | PRN
  Administered 2018-06-11 (×5): 50 ug via INTRAVENOUS

## 2018-06-11 MED ORDER — DEXAMETHASONE SODIUM PHOSPHATE 10 MG/ML IJ SOLN
INTRAMUSCULAR | Status: AC
  Filled 2018-06-11: qty 1

## 2018-06-11 MED ORDER — ONDANSETRON HCL 4 MG/2ML IJ SOLN: 4.0000 mg | Freq: Four times a day (QID) | INTRAMUSCULAR | Status: DC | PRN

## 2018-06-11 MED ORDER — METHOCARBAMOL 500 MG PO TABS: 500.0000 mg | ORAL_TABLET | Freq: Four times a day (QID) | ORAL | Status: DC | PRN

## 2018-06-11 MED ORDER — BUPIVACAINE-EPINEPHRINE (PF) 0.25% -1:200000 IJ SOLN
INTRAMUSCULAR | Status: AC
  Filled 2018-06-11: qty 30

## 2018-06-11 MED ORDER — CEFAZOLIN SODIUM-DEXTROSE 2-4 GM/100ML-% IV SOLN
2.0000 g | INTRAVENOUS | Status: AC
  Administered 2018-06-11: 2 g via INTRAVENOUS
  Filled 2018-06-11: qty 100

## 2018-06-11 MED ORDER — LOSARTAN POTASSIUM 50 MG PO TABS
100.0000 mg | ORAL_TABLET | Freq: Every day | ORAL | Status: DC
  Administered 2018-06-11 – 2018-06-12 (×2): 100 mg via ORAL
  Filled 2018-06-11 (×2): qty 2

## 2018-06-11 MED ORDER — METOPROLOL SUCCINATE ER 50 MG PO TB24
50.0000 mg | ORAL_TABLET | Freq: Every day | ORAL | Status: DC
  Administered 2018-06-12: 50 mg via ORAL
  Filled 2018-06-11: qty 1

## 2018-06-11 MED ORDER — PANTOPRAZOLE SODIUM 40 MG PO TBEC
40.0000 mg | DELAYED_RELEASE_TABLET | Freq: Every day | ORAL | Status: DC
  Administered 2018-06-11 – 2018-06-12 (×2): 40 mg via ORAL
  Filled 2018-06-11 (×2): qty 1

## 2018-06-11 MED ORDER — LACTATED RINGERS IV SOLN
INTRAVENOUS | Status: DC
  Administered 2018-06-11 (×2): via INTRAVENOUS

## 2018-06-11 MED ORDER — FENTANYL CITRATE (PF) 100 MCG/2ML IJ SOLN
INTRAMUSCULAR | Status: AC
  Filled 2018-06-11: qty 2

## 2018-06-11 MED ORDER — PROPOFOL 10 MG/ML IV BOLUS
INTRAVENOUS | Status: DC | PRN
  Administered 2018-06-11: 120 mg via INTRAVENOUS

## 2018-06-11 MED ORDER — OXYCODONE HCL 5 MG PO TABS: 5.0000 mg | ORAL_TABLET | Freq: Once | ORAL | Status: DC | PRN

## 2018-06-11 MED ORDER — MIDAZOLAM HCL 5 MG/5ML IJ SOLN
INTRAMUSCULAR | Status: DC | PRN
  Administered 2018-06-11: 1 mg via INTRAVENOUS

## 2018-06-11 MED ORDER — ROCURONIUM BROMIDE 10 MG/ML (PF) SYRINGE
PREFILLED_SYRINGE | INTRAVENOUS | Status: AC
  Filled 2018-06-11: qty 10

## 2018-06-11 MED ORDER — CEFAZOLIN SODIUM-DEXTROSE 2-4 GM/100ML-% IV SOLN
2.0000 g | Freq: Three times a day (TID) | INTRAVENOUS | Status: AC
  Administered 2018-06-11: 2 g via INTRAVENOUS
  Filled 2018-06-11: qty 100

## 2018-06-11 MED ORDER — SUGAMMADEX SODIUM 200 MG/2ML IV SOLN
INTRAVENOUS | Status: AC
  Filled 2018-06-11: qty 2

## 2018-06-11 MED ORDER — CHLORHEXIDINE GLUCONATE CLOTH 2 % EX PADS: 6.0000 | MEDICATED_PAD | Freq: Once | CUTANEOUS | Status: DC

## 2018-06-11 MED ORDER — TRAMADOL HCL 50 MG PO TABS
50.0000 mg | ORAL_TABLET | Freq: Four times a day (QID) | ORAL | Status: DC | PRN
  Administered 2018-06-11 – 2018-06-12 (×2): 50 mg via ORAL
  Filled 2018-06-11 (×2): qty 1

## 2018-06-11 MED ORDER — GLYCOPYRROLATE 0.2 MG/ML IJ SOLN
INTRAMUSCULAR | Status: DC | PRN
  Administered 2018-06-11 (×2): 0.1 mg via INTRAVENOUS

## 2018-06-11 MED ORDER — CELECOXIB 200 MG PO CAPS
200.0000 mg | ORAL_CAPSULE | Freq: Two times a day (BID) | ORAL | Status: DC
  Administered 2018-06-11 – 2018-06-12 (×4): 200 mg via ORAL
  Filled 2018-06-11 (×4): qty 1

## 2018-06-11 MED ORDER — DEXAMETHASONE SODIUM PHOSPHATE 10 MG/ML IJ SOLN
INTRAMUSCULAR | Status: DC | PRN
  Administered 2018-06-11: 10 mg via INTRAVENOUS

## 2018-06-11 MED ORDER — SODIUM CHLORIDE 0.9 % IR SOLN
Status: DC | PRN
  Administered 2018-06-11: 1000 mL

## 2018-06-11 MED ORDER — SODIUM CHLORIDE 0.9 % IJ SOLN
INTRAMUSCULAR | Status: AC
  Filled 2018-06-11: qty 10

## 2018-06-11 MED ORDER — HYDROMORPHONE HCL 1 MG/ML IJ SOLN
1.0000 mg | INTRAMUSCULAR | Status: DC | PRN
  Administered 2018-06-11: 1 mg via INTRAVENOUS
  Filled 2018-06-11: qty 1

## 2018-06-11 MED ORDER — FENTANYL CITRATE (PF) 250 MCG/5ML IJ SOLN
INTRAMUSCULAR | Status: AC
Start: 2018-06-11 — End: ?
  Filled 2018-06-11: qty 5

## 2018-06-11 MED ORDER — EVICEL 5 ML EX KIT
PACK | CUTANEOUS | Status: AC
  Filled 2018-06-11: qty 1

## 2018-06-11 MED ORDER — GLYCOPYRROLATE PF 0.2 MG/ML IJ SOSY
PREFILLED_SYRINGE | INTRAMUSCULAR | Status: AC
  Filled 2018-06-11: qty 1

## 2018-06-11 MED ORDER — ROPIVACAINE HCL 5 MG/ML IJ SOLN
INTRAMUSCULAR | Status: DC | PRN
  Administered 2018-06-11: 30 mL via PERINEURAL

## 2018-06-11 MED ORDER — ENOXAPARIN SODIUM 40 MG/0.4ML ~~LOC~~ SOLN
40.0000 mg | SUBCUTANEOUS | Status: DC
  Administered 2018-06-12 – 2018-06-13 (×2): 40 mg via SUBCUTANEOUS
  Filled 2018-06-11 (×2): qty 0.4

## 2018-06-11 MED ORDER — OXYCODONE HCL 5 MG/5ML PO SOLN: 5.0000 mg | Freq: Once | ORAL | Status: DC | PRN

## 2018-06-11 MED ORDER — ONDANSETRON HCL 4 MG/2ML IJ SOLN
INTRAMUSCULAR | Status: AC
  Filled 2018-06-11: qty 2

## 2018-06-11 MED ORDER — ARTIFICIAL TEARS OPHTHALMIC OINT
TOPICAL_OINTMENT | OPHTHALMIC | Status: AC
  Filled 2018-06-11: qty 3.5

## 2018-06-11 MED ORDER — EPHEDRINE SULFATE 50 MG/ML IJ SOLN
INTRAMUSCULAR | Status: AC
  Filled 2018-06-11: qty 1

## 2018-06-11 MED ORDER — HYDROCODONE-ACETAMINOPHEN 5-325 MG PO TABS
1.0000 | ORAL_TABLET | ORAL | Status: DC | PRN
  Administered 2018-06-12 (×2): 1 via ORAL
  Filled 2018-06-11 (×2): qty 1

## 2018-06-11 MED ORDER — LACTATED RINGERS IV SOLN
INTRAVENOUS | Status: DC
  Administered 2018-06-11 – 2018-06-12 (×2): via INTRAVENOUS

## 2018-06-11 MED ORDER — GABAPENTIN 300 MG PO CAPS
300.0000 mg | ORAL_CAPSULE | ORAL | Status: AC
  Administered 2018-06-11: 300 mg via ORAL
  Filled 2018-06-11: qty 1

## 2018-06-11 MED ORDER — SIMVASTATIN 20 MG PO TABS
20.0000 mg | ORAL_TABLET | Freq: Every day | ORAL | Status: DC
  Administered 2018-06-11 – 2018-06-12 (×2): 20 mg via ORAL
  Filled 2018-06-11 (×2): qty 1

## 2018-06-11 MED ORDER — HYDROMORPHONE HCL 1 MG/ML IJ SOLN
0.2500 mg | INTRAMUSCULAR | Status: DC | PRN
  Administered 2018-06-11 (×2): 0.25 mg via INTRAVENOUS

## 2018-06-11 SURGICAL SUPPLY — 59 items
ADH SKN CLS APL DERMABOND .7 (GAUZE/BANDAGES/DRESSINGS) ×1
APPLIER CLIP LOGIC TI 5 (MISCELLANEOUS) IMPLANT
APR CLP MED LRG 33X5 (MISCELLANEOUS)
BINDER ABDOMINAL 12 ML 46-62 (SOFTGOODS) ×2 IMPLANT
BLADE CLIPPER SURG (BLADE) ×2 IMPLANT
CANISTER SUCT 3000ML PPV (MISCELLANEOUS) ×2 IMPLANT
CHLORAPREP W/TINT 26ML (MISCELLANEOUS) ×3 IMPLANT
COVER SURGICAL LIGHT HANDLE (MISCELLANEOUS) ×3 IMPLANT
DECANTER SPIKE VIAL GLASS SM (MISCELLANEOUS) ×3 IMPLANT
DERMABOND ADVANCED (GAUZE/BANDAGES/DRESSINGS) ×2
DERMABOND ADVANCED .7 DNX12 (GAUZE/BANDAGES/DRESSINGS) ×1 IMPLANT
DEVICE SECURE STRAP 25 ABSORB (INSTRUMENTS) ×3 IMPLANT
DEVICE TROCAR PUNCTURE CLOSURE (ENDOMECHANICALS) ×3 IMPLANT
DRAPE LAPAROSCOPIC ABDOMINAL (DRAPES) ×3 IMPLANT
DRSG KUZMA FLUFF (GAUZE/BANDAGES/DRESSINGS) ×6 IMPLANT
ELECT REM PT RETURN 9FT ADLT (ELECTROSURGICAL) ×3
ELECTRODE REM PT RTRN 9FT ADLT (ELECTROSURGICAL) ×1 IMPLANT
GLOVE BIO SURGEON STRL SZ 6.5 (GLOVE) ×1 IMPLANT
GLOVE BIO SURGEON STRL SZ7.5 (GLOVE) ×2 IMPLANT
GLOVE BIO SURGEONS STRL SZ 6.5 (GLOVE) ×1
GLOVE BIOGEL PI IND STRL 6.5 (GLOVE) IMPLANT
GLOVE BIOGEL PI IND STRL 7.0 (GLOVE) IMPLANT
GLOVE BIOGEL PI IND STRL 7.5 (GLOVE) IMPLANT
GLOVE BIOGEL PI INDICATOR 6.5 (GLOVE) ×2
GLOVE BIOGEL PI INDICATOR 7.0 (GLOVE) ×2
GLOVE BIOGEL PI INDICATOR 7.5 (GLOVE) ×4
GLOVE EUDERMIC 7 POWDERFREE (GLOVE) ×3 IMPLANT
GOWN STRL REUS W/ TWL LRG LVL3 (GOWN DISPOSABLE) ×2 IMPLANT
GOWN STRL REUS W/ TWL XL LVL3 (GOWN DISPOSABLE) ×1 IMPLANT
GOWN STRL REUS W/TWL LRG LVL3 (GOWN DISPOSABLE) ×3
GOWN STRL REUS W/TWL XL LVL3 (GOWN DISPOSABLE) ×9
KIT BASIN OR (CUSTOM PROCEDURE TRAY) ×3 IMPLANT
KIT TURNOVER KIT B (KITS) ×3 IMPLANT
MARKER SKIN DUAL TIP RULER LAB (MISCELLANEOUS) ×3 IMPLANT
MESH VENTRALIGHT ST 6X8 (Mesh Specialty) ×3 IMPLANT
MESH VENTRLGHT ELLIPSE 8X6XMFL (Mesh Specialty) IMPLANT
NDL SPNL 22GX3.5 QUINCKE BK (NEEDLE) ×1 IMPLANT
NEEDLE SPNL 22GX3.5 QUINCKE BK (NEEDLE) ×3 IMPLANT
NS IRRIG 1000ML POUR BTL (IV SOLUTION) ×3 IMPLANT
PAD ARMBOARD 7.5X6 YLW CONV (MISCELLANEOUS) ×6 IMPLANT
SCISSORS LAP 5X35 DISP (ENDOMECHANICALS) ×2 IMPLANT
SET IRRIG TUBING LAPAROSCOPIC (IRRIGATION / IRRIGATOR) ×2 IMPLANT
SHEARS HARMONIC ACE PLUS 36CM (ENDOMECHANICALS) ×2 IMPLANT
SLEEVE ENDOPATH XCEL 5M (ENDOMECHANICALS) ×8 IMPLANT
STAPLER VISISTAT 35W (STAPLE) ×2 IMPLANT
SUT MNCRL AB 4-0 PS2 18 (SUTURE) ×3 IMPLANT
SUT NOVA NAB DX-16 0-1 5-0 T12 (SUTURE) ×3 IMPLANT
SUT VIC AB 0 CT1 27 (SUTURE) ×3
SUT VIC AB 0 CT1 27XBRD ANBCTR (SUTURE) IMPLANT
SUT VICRYL 0 UR6 27IN ABS (SUTURE) ×4 IMPLANT
TIP RIGID 35CM EVICEL (HEMOSTASIS) ×2 IMPLANT
TOWEL OR 17X24 6PK STRL BLUE (TOWEL DISPOSABLE) ×3 IMPLANT
TOWEL OR 17X26 10 PK STRL BLUE (TOWEL DISPOSABLE) ×1 IMPLANT
TRAY FOLEY MTR SLVR 14FR STAT (SET/KITS/TRAYS/PACK) IMPLANT
TRAY LAPAROSCOPIC MC (CUSTOM PROCEDURE TRAY) ×3 IMPLANT
TROCAR XCEL NON-BLD 11X100MML (ENDOMECHANICALS) ×2 IMPLANT
TROCAR XCEL NON-BLD 5MMX100MML (ENDOMECHANICALS) ×3 IMPLANT
TUBING INSUFFLATION (TUBING) ×3 IMPLANT
WATER STERILE IRR 1000ML POUR (IV SOLUTION) ×3 IMPLANT

## 2018-06-11 NOTE — Anesthesia Procedure Notes (Signed)
Anesthesia Regional Block: TAP block   Pre-Anesthetic Checklist: ,, timeout performed, Correct Patient, Correct Site, Correct Laterality, Correct Procedure, Correct Position, site marked, Risks and benefits discussed,  Surgical consent,  Pre-op evaluation,  At surgeon's request and post-op pain management  Laterality: Right  Prep: chloraprep       Needles:  Injection technique: Single-shot  Needle Type: Stimiplex     Needle Length: 9cm  Needle Gauge: 21     Additional Needles:   Procedures:,,,, ultrasound used (permanent image in chart),,,,  Narrative:  Start time: 06/11/2018 7:38 AM End time: 06/11/2018 7:43 AM Injection made incrementally with aspirations every 5 mL.  Performed by: Personally  Anesthesiologist: Lynda Rainwater, MD

## 2018-06-11 NOTE — Anesthesia Preprocedure Evaluation (Signed)
Anesthesia Evaluation  Patient identified by MRN, date of birth, ID band Patient awake    Airway Mallampati: I  TM Distance: >3 FB Neck ROM: Full    Dental no notable dental hx.    Pulmonary neg pulmonary ROS, former smoker,    Pulmonary exam normal breath sounds clear to auscultation       Cardiovascular hypertension, Pt. on medications and Pt. on home beta blockers + CAD and + CABG  Normal cardiovascular exam+ dysrhythmias  Rhythm:Regular Rate:Normal     Neuro/Psych negative neurological ROS     GI/Hepatic Neg liver ROS, GERD  ,  Endo/Other  negative endocrine ROS  Renal/GU negative Renal ROS     Musculoskeletal  (+) Arthritis , Osteoarthritis,    Abdominal   Peds  Hematology negative hematology ROS (+)   Anesthesia Other Findings Day of surgery medications reviewed with the patient.  Reproductive/Obstetrics                             Anesthesia Physical  Anesthesia Plan  ASA: III  Anesthesia Plan: General and Regional   Post-op Pain Management: GA combined w/ Regional for post-op pain   Induction: Intravenous  PONV Risk Score and Plan: 2 and Ondansetron and Midazolam  Airway Management Planned: Oral ETT  Additional Equipment:   Intra-op Plan: Utilization of Controlled Hypotension per surrgeon request  Post-operative Plan:   Informed Consent: I have reviewed the patients History and Physical, chart, labs and discussed the procedure including the risks, benefits and alternatives for the proposed anesthesia with the patient or authorized representative who has indicated his/her understanding and acceptance.   Dental advisory given  Plan Discussed with: CRNA and Surgeon  Anesthesia Plan Comments:         Anesthesia Quick Evaluation

## 2018-06-11 NOTE — Progress Notes (Signed)
Transferred from PACU, alert, oriented

## 2018-06-11 NOTE — Anesthesia Postprocedure Evaluation (Signed)
Anesthesia Post Note  Patient: Tyler Lawrence  Procedure(s) Performed: LAPAROSCOPIC ASSISTED RIGHT SPIGELIAN HERNIA REPAIR (Right Abdomen) INSERTION OF MESH (Right Abdomen)     Patient location during evaluation: PACU Anesthesia Type: General Level of consciousness: awake and alert Pain management: pain level controlled Vital Signs Assessment: post-procedure vital signs reviewed and stable Respiratory status: spontaneous breathing, nonlabored ventilation and respiratory function stable Cardiovascular status: blood pressure returned to baseline and stable Postop Assessment: no apparent nausea or vomiting Anesthetic complications: no    Last Vitals:  Vitals:   06/11/18 1208 06/11/18 1224  BP:  115/66  Pulse: 75 (!) 51  Resp: 12 16  Temp: (!) 36.4 C 36.6 C  SpO2: 93% 100%    Last Pain:  Vitals:   06/11/18 1224  TempSrc: Oral  PainSc:                  Lynda Rainwater

## 2018-06-11 NOTE — Interval H&P Note (Signed)
History and Physical Interval Note:  06/11/2018 7:34 AM  Tyler Lawrence  has presented today for surgery, with the diagnosis of SPIGELIAN HERNIA - RIGHT  The various methods of treatment have been discussed with the patient and family. After consideration of risks, benefits and other options for treatment, the patient has consented to  Procedure(s) with comments: Hurricane (Right) - GENERAL AND TAP BLOCK INSERTION OF MESH (Right) - GENERAL AND TAP BLOCK as a surgical intervention .  The patient's history has been reviewed, patient examined, no change in status, stable for surgery.  I have reviewed the patient's chart and labs.  Questions were answered to the patient's satisfaction.     [name]

## 2018-06-11 NOTE — Op Note (Signed)
Patient Name:           Tyler Lawrence   Date of Surgery:        06/11/2018  Pre op Diagnosis:      Right spigelian hernia  Post op Diagnosis:    Right spigelian hernia  Procedure:                 Diagnostic laparoscopy, repair right spigelian hernia with inlay ventrallex ST composite mesh (16 X 16 CM)  Surgeon:                     Edsel Petrin. Dalbert Batman, M.D., FACS  Assistant:                      Dr. Ralene Ok   Indication for Assistant: Complex adhesions, complex reoperative surgery, prior inguinal hernia repair with mesh, prior appendectomy, unusual anatomy  Operative Indications:   This is a very pleasant 72 year old man who returns for management of a right spigelian hernia. Dr. Nona Dell is his cardiologist. Dr. Kerin Perna is his PCP.     He's had an appendectomy that was done laparoscopically in [place] a few years ago. Last year Dr. Neysa Bonito performed laparoscopic bilateral inguinal hernia repairs with mesh, right side recurrent left side first time repair. 3 pieces of mesh were used he also had an epigastric abdominal wall hernia at an old chest tube site.      He reports right-sided discomfort and bulge for 2 years. He says this is getting worse and is very aggravating it is very difficult for him to work. 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 CT scan shows a widemouth right spigelian hernia. Inguinal hernia repairs are intact. The intestine is seen in the hernia but it is not clear whether it is adherent. Certainly there is no inflammatory change or obstruction. Symptomatically he just has a bulge and progressive discomfort but no obstructive symptoms. Comorbidities include CAD, include CABG. Had recent echocardiogram and stress test and was told that was okay. Inguinal hernia repair 2012 by Dr. Romona Curls. Bilateral inguinal hernia. Dr. gross 2016. Laparoscopic appendectomy by Dr. Arnoldo Morale several years ago.       He says this is aggravating him and  he wants to have it repaired. I told him the techniques of the surgery and the temporary disabilities. I told him no heavy lifting or sports for 5 weeks. We agree that he could go back to work and do a sedentary job in 2 weeks but no lifting or telephone pole climbing for 5 weeks. He will need to stay at least 1 night in the hospital. He is going to talk to his boss but he thinks this will be okay.       He will be scheduled for laparoscopic repair of right spigelian hernia with mesh, possible open.. He agrees with this plan    Operative Findings:       He had a right spigelian hernia.  The defect was probably about 6 or 7 cm in diameter.  We dissected incarcerated fat out of the defect but the intestine was not incarcerated.  Dissection medially, superiorly, and superior laterally was fairly straightforward.  In the right inguinal area  the adhesions were very dense because of the multiple pieces of mesh present.  We were able to expose the full rim of the defect.  Inferiorly and posteriorly we glued the mesh in place because of  concern for nerve damage.  Everywhere else we used suture fixation and a secure strap device.  A peritoneal flap was pulled down from anterior to posterior and after placing the mesh that was tacked back up covering the mesh.  Procedure in Detail:          Following the induction of general endotracheal anesthesia a Foley catheter was placed.  Intravenous antibiotics were given.  Surgical timeout was performed.  The abdomen and genitalia were prepped and draped in a sterile fashion.     0.5% Marcaine with epinephrine was used as local infiltration anesthetic.  A 5 mm optical trocar was placed in the left subcostal region without difficulty.  Pneumoperitoneum was created.  Video camera was inserted.  4 quadrant inspection revealed no bleeding or injury.  An 11 mm trocar was placed in the left upper flank.  The 5 mm trocar was placed in the epigastrium and a 5 mm trocar placed  in the left lower quadrant.  The patient was positioned.  We defined the anatomy.  Using scissor cautery I created along peritoneal flap incising the peritoneum anteriorly for a posterior-based flap.  We slowly dissected this peritoneal flap off of the muscle and then off of the hernia.  We debrided the fat out of the hernia and we then define the posterior rim of the hernia.  As we got inferiorly we can feel the pelvic bone then we could see the ultra pro mesh from the previous hernia repair.  We dissected this down and away as much as we felt safe and then stopped.  We could see the entire rim of the defect and we can overlap it everywhere except in the inguinal area.  In that area we chose to use Tisseel glue.       Medially and superiorly I used 3 interrupted sutures of 0 Vicryl to tack the muscle down to the hernia to close down the space.     We measured the defect and then cut fashioned a piece of ventralex ST composite mesh about 16 cm in diameter.  We created a template on the abdominal wall and we marked four suture fixation sites medially and superiorly and superior posteriorly.  #1 Novofil sutures were placed at these 4 areas.  The mesh was moistened, rolled up and inserted.  We opened the mesh up.  We were very careful to keep the rough side of the mesh up toward the abdominal muscles and the smooth side down toward the peritoneum.  The Novofil sutures were drawn up to the four suture fixation sites through a small puncture wound.  We were careful to take a good bite of fascia.  After all of these were placed we lifted him up and tied them.  We then lifted up the mesh and then pulled the peritoneal flap down and put Tisseel glue back along the posterior rim inside and out.  We then placed the mesh over the defect posteriorly and tucked it inferiorly.  Secure strap was used to secure the mesh to the abdominal wall everywhere but in the inferior area where we are concerned about nerve damage.    We  inspected the repair and both of [place] felt that it was more than adequate with good coverage and fixation.  After the glue dried we lifted up the peritoneal flap and tacked it back in place with a secure strap device.  That covered 95% of the mesh. 4 quadrant inspection was performed.  There did  not appear to be any injury or   bleeding.  We brought the omentum back down into the pelvis.  The trochars were removed and there was no bleeding.  The pneumoperitoneum was released.  The skin incisions were closed with skin staples and the puncture wounds covered with Tisseel.  Dry bandage and abdominal binder was placed.  The patient tolerated the procedure well was taken to PACU in stable condition.  The Foley catheter was to be removed in the operating room.  EBL less than 20 cc.  Counts correct.  Complications none.   Addendum: I logged onto the [employer] and reviewed his prescription medication history    Jewelia Bocchino M. Dalbert Batman, M.D., FACS General and Minimally Invasive Surgery Breast and Colorectal Surgery  06/11/2018 10:45 AM

## 2018-06-11 NOTE — Anesthesia Procedure Notes (Signed)
Procedure Name: Intubation Date/Time: 06/11/2018 8:08 AM Performed by: Gwyndolyn Saxon, CRNA Pre-anesthesia Checklist: Patient identified, Emergency Drugs available, Suction available, Patient being monitored and Timeout performed Patient Re-evaluated:Patient Re-evaluated prior to induction Oxygen Delivery Method: Circle system utilized Preoxygenation: Pre-oxygenation with 100% oxygen Induction Type: IV induction Ventilation: Mask ventilation without difficulty Laryngoscope Size: Miller and 2 Grade View: Grade III Tube type: Oral Tube size: 7.5 mm Number of attempts: 1 Airway Equipment and Method: Patient positioned with wedge pillow Placement Confirmation: ETT inserted through vocal cords under direct vision,  positive ETCO2,  CO2 detector and breath sounds checked- equal and bilateral Secured at: 22 cm Tube secured with: Tape Dental Injury: Teeth and Oropharynx as per pre-operative assessment  Difficulty Due To: Difficult Airway- due to anterior larynx

## 2018-06-11 NOTE — Transfer of Care (Signed)
Immediate Anesthesia Transfer of Care Note  Patient: Tyler Lawrence  Procedure(s) Performed: LAPAROSCOPIC ASSISTED RIGHT SPIGELIAN HERNIA REPAIR (Right Abdomen) INSERTION OF MESH (Right Abdomen)  Patient Location: PACU  Anesthesia Type:General  Level of Consciousness: awake  Airway & Oxygen Therapy: Patient Spontanous Breathing and Patient connected to face mask oxygen  Post-op Assessment: Report given to RN and Post -op Vital signs reviewed and stable  Post vital signs: Reviewed and stable  Last Vitals:  Vitals Value Taken Time  BP 141/68 06/11/2018 10:50 AM  Temp    Pulse 86 06/11/2018 10:53 AM  Resp 18 06/11/2018 10:53 AM  SpO2 96 % 06/11/2018 10:53 AM  Vitals shown include unvalidated device data.  Last Pain:  Vitals:   06/11/18 0710  TempSrc:   PainSc: 0-No pain         Complications: No apparent anesthesia complications

## 2018-06-12 ENCOUNTER — Encounter (HOSPITAL_COMMUNITY): Payer: Self-pay | Admitting: General Surgery

## 2018-06-12 DIAGNOSIS — D72829 Elevated white blood cell count, unspecified: Secondary | ICD-10-CM | POA: Diagnosis not present

## 2018-06-12 DIAGNOSIS — Z87891 Personal history of nicotine dependence: Secondary | ICD-10-CM | POA: Diagnosis not present

## 2018-06-12 DIAGNOSIS — K219 Gastro-esophageal reflux disease without esophagitis: Secondary | ICD-10-CM | POA: Diagnosis not present

## 2018-06-12 DIAGNOSIS — Z951 Presence of aortocoronary bypass graft: Secondary | ICD-10-CM | POA: Diagnosis not present

## 2018-06-12 DIAGNOSIS — Z7982 Long term (current) use of aspirin: Secondary | ICD-10-CM | POA: Diagnosis not present

## 2018-06-12 DIAGNOSIS — Z9049 Acquired absence of other specified parts of digestive tract: Secondary | ICD-10-CM | POA: Diagnosis not present

## 2018-06-12 DIAGNOSIS — Z79899 Other long term (current) drug therapy: Secondary | ICD-10-CM | POA: Diagnosis not present

## 2018-06-12 DIAGNOSIS — K660 Peritoneal adhesions (postprocedural) (postinfection): Secondary | ICD-10-CM | POA: Diagnosis not present

## 2018-06-12 DIAGNOSIS — Z888 Allergy status to other drugs, medicaments and biological substances status: Secondary | ICD-10-CM | POA: Diagnosis not present

## 2018-06-12 DIAGNOSIS — I2510 Atherosclerotic heart disease of native coronary artery without angina pectoris: Secondary | ICD-10-CM | POA: Diagnosis not present

## 2018-06-12 DIAGNOSIS — K432 Incisional hernia without obstruction or gangrene: Secondary | ICD-10-CM | POA: Diagnosis not present

## 2018-06-12 DIAGNOSIS — K439 Ventral hernia without obstruction or gangrene: Secondary | ICD-10-CM | POA: Diagnosis not present

## 2018-06-12 DIAGNOSIS — I10 Essential (primary) hypertension: Secondary | ICD-10-CM | POA: Diagnosis not present

## 2018-06-12 DIAGNOSIS — M1990 Unspecified osteoarthritis, unspecified site: Secondary | ICD-10-CM | POA: Diagnosis not present

## 2018-06-12 LAB — CBC
HEMATOCRIT: 38.1 % — AB (ref 39.0–52.0)
HEMOGLOBIN: 12.7 g/dL — AB (ref 13.0–17.0)
MCH: 32.1 pg (ref 26.0–34.0)
MCHC: 33.3 g/dL (ref 30.0–36.0)
MCV: 96.2 fL (ref 78.0–100.0)
Platelets: 203 10*3/uL (ref 150–400)
RBC: 3.96 MIL/uL — ABNORMAL LOW (ref 4.22–5.81)
RDW: 12.7 % (ref 11.5–15.5)
WBC: 18.7 10*3/uL — AB (ref 4.0–10.5)

## 2018-06-12 LAB — BASIC METABOLIC PANEL
Anion gap: 7 (ref 5–15)
BUN: 15 mg/dL (ref 8–23)
CALCIUM: 8.8 mg/dL — AB (ref 8.9–10.3)
CHLORIDE: 104 mmol/L (ref 98–111)
CO2: 26 mmol/L (ref 22–32)
Creatinine, Ser: 1.09 mg/dL (ref 0.61–1.24)
GFR calc non Af Amer: >60 mL/min (ref >60)
Glucose, Bld: 182 mg/dL — ABNORMAL HIGH (ref 70–99)
Potassium: 4 mmol/L (ref 3.5–5.1)
SODIUM: 137 mmol/L (ref 135–145)

## 2018-06-12 NOTE — Progress Notes (Signed)
1 Day Post-Op  Subjective: Stable postop.  Alert.  Has some pain but minimal.  Ambulating independently.  Voiding without difficulty.  No respiratory complaints.  No nausea or vomiting.  Says he is constipated. Denies buttock or thigh pain or numbness.  Denies weakness  He says he is ready to advance to regular diet and wants to get the IV out.  He does not feel good enough to go home today so the plan is to discharge tomorrow  Potassium 4.0.  Glucose 182.  Creatinine 1.09.  Not a significant rise.  Hemoglobin 12.7.  WBC 18,700.    Objective: Vital signs in last 24 hours: Temp:  [97.5 F (36.4 C)-98.8 F (37.1 C)] 98.8 F (37.1 C) (07/24 0543) Pulse Rate:  [51-88] 58 (07/24 0543) Resp:  [12-26] 20 (07/24 0543) BP: (101-141)/(58-78) 117/61 (07/24 0543) SpO2:  [91 %-100 %] 97 % (07/24 0543) Last BM Date: 06/11/18  Intake/Output from previous day: 07/23 0701 - 07/24 0700 In: 3761.7 [P.O.:630; I.V.:2931.7; IV Piggyback:200] Out: 110 [Urine:100; Blood:10] Intake/Output this shift: No intake/output data recorded.  General appearance: Alert.  No distress.  Mental status normal.  Good spirits.  I observed him to get out of bed independently and ambulate around the hall Resp: clear to auscultation bilaterally GI: Soft.  Nondistended.  Trocar sites look good.  Minimal tenderness right lower quadrant.  No swelling or bulge Extremities: Extremities normal.  No sensory deficit right thigh or buttock.  Ambulating without difficulty.  No weakness.  No edema or cyanosis  Lab Results:  Results for orders placed or performed during the hospital encounter of 06/11/18 (from the past 24 hour(s))  CBC     Status: Abnormal   Collection Time: 06/11/18 12:51 PM  Result Value Ref Range   WBC 15.6 (H) 4.0 - 10.5 K/uL   RBC 4.33 4.22 - 5.81 MIL/uL   Hemoglobin 14.0 13.0 - 17.0 g/dL   HCT 41.2 39.0 - 52.0 %   MCV 95.2 78.0 - 100.0 fL   MCH 32.3 26.0 - 34.0 pg   MCHC 34.0 30.0 - 36.0 g/dL   RDW 12.5  11.5 - 15.5 %   Platelets 210 150 - 400 K/uL  Creatinine, serum     Status: None   Collection Time: 06/11/18 12:51 PM  Result Value Ref Range   Creatinine, Ser 1.02 0.61 - 1.24 mg/dL   GFR calc non Af Amer >60 >60 mL/min   GFR calc Af Amer >60 >60 mL/min  Basic metabolic panel     Status: Abnormal   Collection Time: 06/12/18  6:29 AM  Result Value Ref Range   Sodium 137 135 - 145 mmol/L   Potassium 4.0 3.5 - 5.1 mmol/L   Chloride 104 98 - 111 mmol/L   CO2 26 22 - 32 mmol/L   Glucose, Bld 182 (H) 70 - 99 mg/dL   BUN 15 8 - 23 mg/dL   Creatinine, Ser 1.09 0.61 - 1.24 mg/dL   Calcium 8.8 (L) 8.9 - 10.3 mg/dL   GFR calc non Af Amer >60 >60 mL/min   GFR calc Af Amer >60 >60 mL/min   Anion gap 7 5 - 15  CBC     Status: Abnormal   Collection Time: 06/12/18  6:29 AM  Result Value Ref Range   WBC 18.7 (H) 4.0 - 10.5 K/uL   RBC 3.96 (L) 4.22 - 5.81 MIL/uL   Hemoglobin 12.7 (L) 13.0 - 17.0 g/dL   HCT 38.1 (L) 39.0 - 52.0 %  MCV 96.2 78.0 - 100.0 fL   MCH 32.1 26.0 - 34.0 pg   MCHC 33.3 30.0 - 36.0 g/dL   RDW 12.7 11.5 - 15.5 %   Platelets 203 150 - 400 K/uL     Studies/Results: No results found.  Marland Kitchen amLODipine  5 mg Oral Daily  . aspirin EC  81 mg Oral QHS  . celecoxib  200 mg Oral BID  . enoxaparin (LOVENOX) injection  40 mg Subcutaneous Q24H  . gabapentin  300 mg Oral BID  . losartan  100 mg Oral QHS  . metoprolol succinate  50 mg Oral Daily  . pantoprazole  40 mg Oral Daily  . senna  1 tablet Oral BID  . simvastatin  20 mg Oral q1800     Assessment/Plan: s/p Procedure(s): LAPAROSCOPIC ASSISTED RIGHT SPIGELIAN HERNIA REPAIR INSERTION OF MESH  POD #1.  Laparoscopic repair spigelian hernia with mesh. Clinically he is doing very well without any signs of complication Advance diet and activities Senokot twice daily as bowel program Discharge home tomorrow  Leukocytosis.  Significance of this is unknown.  No evidence of any inflammatory or infectious complication.   This may simply be reactive due to catecholamine release. Repeat CBC tomorrow  CAD, status post CABG.  Stable and asymptomatic Hypertension, essential.  BP well controlled    @PROBHOSP @  LOS: 0 days    [name] 06/12/2018

## 2018-06-13 ENCOUNTER — Encounter (HOSPITAL_COMMUNITY): Payer: Self-pay | Admitting: General Surgery

## 2018-06-13 DIAGNOSIS — K432 Incisional hernia without obstruction or gangrene: Secondary | ICD-10-CM | POA: Diagnosis not present

## 2018-06-13 DIAGNOSIS — K439 Ventral hernia without obstruction or gangrene: Secondary | ICD-10-CM | POA: Diagnosis not present

## 2018-06-13 DIAGNOSIS — D72829 Elevated white blood cell count, unspecified: Secondary | ICD-10-CM | POA: Diagnosis not present

## 2018-06-13 DIAGNOSIS — Z7982 Long term (current) use of aspirin: Secondary | ICD-10-CM | POA: Diagnosis not present

## 2018-06-13 DIAGNOSIS — Z9049 Acquired absence of other specified parts of digestive tract: Secondary | ICD-10-CM | POA: Diagnosis not present

## 2018-06-13 DIAGNOSIS — M1990 Unspecified osteoarthritis, unspecified site: Secondary | ICD-10-CM | POA: Diagnosis not present

## 2018-06-13 DIAGNOSIS — Z79899 Other long term (current) drug therapy: Secondary | ICD-10-CM | POA: Diagnosis not present

## 2018-06-13 DIAGNOSIS — I2510 Atherosclerotic heart disease of native coronary artery without angina pectoris: Secondary | ICD-10-CM | POA: Diagnosis not present

## 2018-06-13 DIAGNOSIS — K219 Gastro-esophageal reflux disease without esophagitis: Secondary | ICD-10-CM | POA: Diagnosis not present

## 2018-06-13 DIAGNOSIS — Z87891 Personal history of nicotine dependence: Secondary | ICD-10-CM | POA: Diagnosis not present

## 2018-06-13 DIAGNOSIS — Z951 Presence of aortocoronary bypass graft: Secondary | ICD-10-CM | POA: Diagnosis not present

## 2018-06-13 DIAGNOSIS — K660 Peritoneal adhesions (postprocedural) (postinfection): Secondary | ICD-10-CM | POA: Diagnosis not present

## 2018-06-13 DIAGNOSIS — I10 Essential (primary) hypertension: Secondary | ICD-10-CM | POA: Diagnosis not present

## 2018-06-13 DIAGNOSIS — Z888 Allergy status to other drugs, medicaments and biological substances status: Secondary | ICD-10-CM | POA: Diagnosis not present

## 2018-06-13 LAB — CBC
HEMATOCRIT: 38.2 % — AB (ref 39.0–52.0)
HEMOGLOBIN: 12.7 g/dL — AB (ref 13.0–17.0)
MCH: 32.3 pg (ref 26.0–34.0)
MCHC: 33.2 g/dL (ref 30.0–36.0)
MCV: 97.2 fL (ref 78.0–100.0)
Platelets: 180 10*3/uL (ref 150–400)
RBC: 3.93 MIL/uL — ABNORMAL LOW (ref 4.22–5.81)
RDW: 12.9 % (ref 11.5–15.5)
WBC: 13.1 10*3/uL — AB (ref 4.0–10.5)

## 2018-06-13 MED ORDER — HYDROCODONE-ACETAMINOPHEN 5-325 MG PO TABS: 1.0000 | ORAL_TABLET | Freq: Four times a day (QID) | ORAL | 0 refills | Status: DC | PRN

## 2018-06-13 NOTE — Discharge Summary (Signed)
Patient ID: ACHERON SUGG [id number] 72 y.o. 1946-03-01  Admit date: 06/11/2018  Discharge date and time: 06/13/2018  Admitting Physician: [name]  Discharge Physician: [name]  Admission Diagnoses: [place] - RIGHT  Discharge Diagnoses: Right spigelian hernia                                         History of laparoscopic repair bilateral inguinal hernias with mesh                                         History appendectomy                                         Coronary artery disease, status post CABG  Operations: Procedure(s): LAPAROSCOPIC ASSISTED RIGHT SPIGELIAN HERNIA REPAIR INSERTION OF MESH  Admission Condition: good  Discharged Condition: good  Indication for Admission:    This is a very pleasant 72 year old man who returns for management of a right spigelian hernia. Dr. Nona Dell is his cardiologist. Dr. Kerin Perna is his PCP.  He's had an appendectomy that was done laparoscopically in [place] a few years ago. Last year Dr. Neysa Bonito performed laparoscopic bilateral inguinal hernia repairs with mesh, right side recurrent left side first time repair. 3 pieces of mesh were used he also had an epigastric abdominal wall hernia at an old chest tube site. He reports right-sided discomfort and bulge for 2 years. He says this is getting worse and is very aggravating it is very difficult for him to work. 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 CT scan shows a [place] right spigelian hernia. Inguinal hernia repairs are intact. The intestine is seen in the hernia but it is not clear whether it is adherent. Certainly there is no inflammatory change or obstruction. Symptomatically he just has a bulge and progressive discomfort but no obstructive symptoms. Comorbidities include CAD, include CABG. Had recent echocardiogram and stress test and was told that was okay. Inguinal hernia repair 2012 by Dr. Romona Curls. Bilateral  inguinal hernia. Dr. gross 2016. Laparoscopic appendectomy by Dr. Arnoldo Morale several years ago. He says this is aggravating him and he wants to have it repaired. I told him the techniques of the surgery and the temporary disabilities. I told him no heavy lifting or sports for 5 weeks. We agree that he could go back to work and do a sedentary job in 2 weeks but no lifting or telephone pole climbing for 5 weeks. He will need to stay at least 1 night in the hospital. He is going to talk to his boss but he thinks this will be okay. He will be scheduled for laparoscopic repair of right spigelian hernia with mesh, possible open.. He agrees with this plan    Hospital Course: On the day of admission the patient was taken to the operating room and underwent laparoscopic repair of right spigelian hernia.     This was a somewhat more tedious than usual because of his previous surgery.  Operative findings reveal  He had a right spigelian hernia.  The defect was probably about 6 or 7 cm in diameter.  We dissected incarcerated fat out  of the defect but the intestine was not incarcerated.  Dissection medially, superiorly, and superior laterally was fairly straightforward.  In the right inguinal area  the adhesions were very dense because of the multiple pieces of mesh present.  We were able to expose the full rim of the defect.  Inferiorly and posteriorly we glued the mesh in place because of concern for nerve damage.  Everywhere else we used suture fixation and a secure strap device.  A peritoneal flap was pulled down from anterior to posterior and after placing the mesh that was tacked back up covering the mesh.     He did well postop.  Postop day 1 he his abdominal exam was unremarkable and he felt well.  He became ambulatory and was voiding without difficulty.  His diet was advanced and he had a bowel movement later in the day.  On postop day 2 he was discharged home doing well.  Examination on the day  of discharge reveals abdomen soft and nondistended.  All of his wounds were clean.  One in his lateral suture sites had a little bit of serous fluid draining from it which seemed fairly innocent.  This was bandaged and wound care was discussed. He was told to take high-dose Tylenol for pain but we also called in a small prescription for   hydrocodone to his pharmacy.  Diet and activities were discussed.  He will return to the office in 10 to 14 days to have his staples removed.  He was reminded not to play sports or do any heavy lifting or return to work for 6 weeks.    Consults: None  Significant Diagnostic Studies: None  Treatments: surgery: Repair right spigelian hernia  Disposition: Home  Patient Instructions:  Allergies as of 06/13/2018   No Active Allergies     Medication List    TAKE these medications   amLODipine 5 MG tablet Commonly known as:  NORVASC Take 1 tablet (5 mg total) by mouth daily.   aspirin EC 81 MG tablet Take 81 mg by mouth at bedtime.   HYDROcodone-acetaminophen 5-325 MG tablet Commonly known as:  NORCO Take 1 tablet by mouth every 6 (six) hours as needed.   losartan 100 MG tablet Commonly known as:  COZAAR Take 100 mg by mouth at bedtime.   metoprolol succinate 50 MG 24 hr tablet Commonly known as:  TOPROL-XL TAKE 1 TABLET BY MOUTH EVERY DAY   multivitamin with minerals tablet Take 1 tablet by mouth daily.   simvastatin 20 MG tablet Commonly known as:  ZOCOR Take 1 tablet (20 mg total) by mouth daily at 6 PM. PLEASE CONTACT THE OFFICE FOR ADDITIONAL REFILLS            Discharge Care Instructions  (From admission, onward)        Start     Ordered   06/13/18 0000  Discharge wound care:    Comments:  Keep a bandage on the draining area until it stops We will remove the staples in the office at your next office visit in 2 weeks You may shower. No tub baths or swimming pools   06/13/18 0755      Activity: no heavy lifting for 6  weeks Diet: low fat, low cholesterol diet Wound Care: as directed  Follow-up:  With Dr. Dalbert Batman in 2 weeks    Addendum: I logged onto the [facility] website and reviewed his prescription medication history.  Signed: Edsel Petrin. Dalbert Batman, M.D., FACS General and minimally  invasive surgery Breast and Colorectal Surgery  06/13/2018, 7:58 AM

## 2018-06-13 NOTE — Discharge Instructions (Signed)
[facility] ______[facility] °LAPAROSCOPIC SURGERY: POST OP INSTRUCTIONS °Always review your discharge instruction sheet given to you by the facility where your surgery was performed. °IF YOU HAVE DISABILITY OR FAMILY LEAVE FORMS, YOU MUST BRING THEM TO THE OFFICE FOR PROCESSING.   °DO NOT GIVE THEM TO YOUR DOCTOR. ° °1. A prescription for pain medication may be given to you upon discharge.  Take your pain medication as prescribed, if needed.  If narcotic pain medicine is not needed, then you may take acetaminophen (Tylenol) or ibuprofen (Advil) as needed. °2. Take your usually prescribed medications unless otherwise directed. °3. If you need a refill on your pain medication, please contact your pharmacy.  They will contact our office to request authorization. Prescriptions will not be filled after 5pm or on week-ends. °4. You should follow a light diet the first few days after arrival home, such as soup and crackers, etc.  Be sure to include lots of fluids daily. °5. Most patients will experience some swelling and bruising in the area of the incisions.  Ice packs will help.  Swelling and bruising can take several days to resolve.  °6. It is common to experience some constipation if taking pain medication after surgery.  Increasing fluid intake and taking a stool softener (such as Colace) will usually help or prevent this problem from occurring.  A mild laxative (Milk of Magnesia or Miralax) should be taken according to package instructions if there are no bowel movements after 48 hours. °7. Unless discharge instructions indicate otherwise, you may remove your bandages 24-48 hours after surgery, and you may shower at that time.  You may have steri-strips (small skin tapes) in place directly over the incision.  These strips should be left on the skin for 7-10 days.  If your surgeon used skin glue on the incision, you may shower in 24 hours.  The glue will flake off over the next 2-3 weeks.  Any sutures or  staples will be removed at the office during your follow-up visit. °8. ACTIVITIES:  You may resume regular (light) daily activities beginning the next day--such as daily self-care, walking, climbing stairs--gradually increasing activities as tolerated.  You may have sexual intercourse when it is comfortable.  Refrain from any heavy lifting or straining until approved by your doctor. °a. You may drive when you are no longer taking prescription pain medication, you can comfortably wear a seatbelt, and you can safely maneuver your car and apply brakes. °b. RETURN TO WORK:  __________________________________________________________ °9. You should see your doctor in the office for a follow-up appointment approximately 2-3 weeks after your surgery.  Make sure that you call for this appointment within a day or two after you arrive home to insure a convenient appointment time. °10. OTHER INSTRUCTIONS: __________________________________________________________________________________________________________________________ __________________________________________________________________________________________________________________________ °WHEN TO CALL YOUR DOCTOR: °1. Fever over 101.0 °2. Inability to urinate °3. Continued bleeding from incision. °4. Increased pain, redness, or drainage from the incision. °5. Increasing abdominal pain ° °The clinic staff is available to answer your questions during regular business hours.  Please don’t hesitate to call and ask to speak to one of the nurses for clinical concerns.  If you have a medical emergency, go to the nearest emergency room or call 911.  A surgeon from [facility] is always on call at the hospital. °[address], [address], [place], [place]  [address] ? [address] [address], [place], [place]   [address] °[phone] ? [phone] ? FAX [phone] °Web site:   [email or web address] °

## 2018-06-13 NOTE — Progress Notes (Signed)
Patient is being discharged home. Discharge instructions were given to patient and family 

## 2018-06-14 ENCOUNTER — Encounter (HOSPITAL_COMMUNITY): Payer: Self-pay | Admitting: General Surgery

## 2018-06-17 ENCOUNTER — Encounter (HOSPITAL_COMMUNITY): Payer: Self-pay | Admitting: General Surgery

## 2018-06-27 DIAGNOSIS — Z1389 Encounter for screening for other disorder: Secondary | ICD-10-CM | POA: Diagnosis not present

## 2018-06-27 DIAGNOSIS — E663 Overweight: Secondary | ICD-10-CM | POA: Diagnosis not present

## 2018-06-27 DIAGNOSIS — L209 Atopic dermatitis, unspecified: Secondary | ICD-10-CM | POA: Diagnosis not present

## 2018-06-27 DIAGNOSIS — Z6828 Body mass index (BMI) 28.0-28.9, adult: Secondary | ICD-10-CM | POA: Diagnosis not present

## 2018-07-08 ENCOUNTER — Encounter (HOSPITAL_COMMUNITY): Admission: EM | Disposition: A | Discharge status: Home / Self Care | Payer: Self-pay | Source: Home / Self Care

## 2018-07-08 ENCOUNTER — Observation Stay (HOSPITAL_COMMUNITY)
Admission: EM | Admit: 2018-07-08 | Discharge: 2018-07-11 | DRG: 983 | Disposition: A | Discharge status: Home / Self Care | Payer: BLUE CROSS/BLUE SHIELD | Attending: General Surgery | Admitting: General Surgery

## 2018-07-08 ENCOUNTER — Encounter (HOSPITAL_COMMUNITY): Payer: Self-pay

## 2018-07-08 ENCOUNTER — Other Ambulatory Visit: Payer: Self-pay

## 2018-07-08 ENCOUNTER — Observation Stay (HOSPITAL_COMMUNITY): Payer: BLUE CROSS/BLUE SHIELD | Admitting: Certified Registered"

## 2018-07-08 ENCOUNTER — Observation Stay (HOSPITAL_COMMUNITY): Payer: BLUE CROSS/BLUE SHIELD

## 2018-07-08 DIAGNOSIS — Z87891 Personal history of nicotine dependence: Secondary | ICD-10-CM | POA: Diagnosis not present

## 2018-07-08 DIAGNOSIS — I25810 Atherosclerosis of coronary artery bypass graft(s) without angina pectoris: Secondary | ICD-10-CM | POA: Diagnosis not present

## 2018-07-08 DIAGNOSIS — I10 Essential (primary) hypertension: Secondary | ICD-10-CM | POA: Diagnosis present

## 2018-07-08 DIAGNOSIS — R109 Unspecified abdominal pain: Secondary | ICD-10-CM | POA: Diagnosis present

## 2018-07-08 DIAGNOSIS — Z951 Presence of aortocoronary bypass graft: Secondary | ICD-10-CM | POA: Diagnosis not present

## 2018-07-08 DIAGNOSIS — E785 Hyperlipidemia, unspecified: Secondary | ICD-10-CM | POA: Diagnosis present

## 2018-07-08 DIAGNOSIS — I447 Left bundle-branch block, unspecified: Secondary | ICD-10-CM | POA: Diagnosis not present

## 2018-07-08 DIAGNOSIS — K430 Incisional hernia with obstruction, without gangrene: Principal | ICD-10-CM | POA: Diagnosis present

## 2018-07-08 DIAGNOSIS — K219 Gastro-esophageal reflux disease without esophagitis: Secondary | ICD-10-CM | POA: Diagnosis not present

## 2018-07-08 DIAGNOSIS — Z79899 Other long term (current) drug therapy: Secondary | ICD-10-CM

## 2018-07-08 DIAGNOSIS — Z7982 Long term (current) use of aspirin: Secondary | ICD-10-CM

## 2018-07-08 DIAGNOSIS — R1031 Right lower quadrant pain: Secondary | ICD-10-CM | POA: Diagnosis not present

## 2018-07-08 DIAGNOSIS — K439 Ventral hernia without obstruction or gangrene: Secondary | ICD-10-CM | POA: Diagnosis not present

## 2018-07-08 DIAGNOSIS — R1013 Epigastric pain: Secondary | ICD-10-CM | POA: Diagnosis not present

## 2018-07-08 DIAGNOSIS — I2510 Atherosclerotic heart disease of native coronary artery without angina pectoris: Secondary | ICD-10-CM | POA: Diagnosis not present

## 2018-07-08 DIAGNOSIS — E782 Mixed hyperlipidemia: Secondary | ICD-10-CM | POA: Diagnosis not present

## 2018-07-08 HISTORY — DX: Incisional hernia with obstruction, without gangrene: K43.0

## 2018-07-08 HISTORY — PX: LAPAROSCOPIC INTERNAL HERNIA REPAIR: SHX6502

## 2018-07-08 HISTORY — PX: LAPAROSCOPIC ASSISTED SPIGELIAN HERNIA REPAIR: SHX6763

## 2018-07-08 LAB — COMPREHENSIVE METABOLIC PANEL
ALBUMIN: 4.3 g/dL (ref 3.5–5.0)
ALT: 19 U/L (ref 0–44)
ALT: 18 U/L (ref 0–44)
AST: 25 U/L (ref 15–41)
AST: 32 U/L (ref 15–41)
Albumin: 4 g/dL (ref 3.5–5.0)
Alkaline Phosphatase: 86 U/L (ref 38–126)
Alkaline Phosphatase: 80 U/L (ref 38–126)
Anion gap: 10 (ref 5–15)
Anion gap: 11 (ref 5–15)
BILIRUBIN TOTAL: 0.7 mg/dL (ref 0.3–1.2)
BUN: 14 mg/dL (ref 8–23)
BUN: 14 mg/dL (ref 8–23)
CALCIUM: 9.3 mg/dL (ref 8.9–10.3)
CHLORIDE: 104 mmol/L (ref 98–111)
CO2: 24 mmol/L (ref 22–32)
CO2: 23 mmol/L (ref 22–32)
CREATININE: 1.02 mg/dL (ref 0.61–1.24)
CREATININE: 1 mg/dL (ref 0.61–1.24)
Calcium: 9.8 mg/dL (ref 8.9–10.3)
Chloride: 105 mmol/L (ref 98–111)
GFR calc Af Amer: >60 mL/min (ref >60)
GLUCOSE: 140 mg/dL — AB (ref 70–99)
Glucose, Bld: 106 mg/dL — ABNORMAL HIGH (ref 70–99)
Potassium: 3.9 mmol/L (ref 3.5–5.1)
Potassium: 4.6 mmol/L (ref 3.5–5.1)
Sodium: 139 mmol/L (ref 135–145)
Sodium: 138 mmol/L (ref 135–145)
TOTAL PROTEIN: 7.1 g/dL (ref 6.5–8.1)
Total Bilirubin: 1.4 mg/dL — ABNORMAL HIGH (ref 0.3–1.2)
Total Protein: 6.3 g/dL — ABNORMAL LOW (ref 6.5–8.1)

## 2018-07-08 LAB — CBC WITH DIFFERENTIAL/PLATELET
ABS IMMATURE GRANULOCYTES: 0.1 10*3/uL (ref 0.0–0.1)
Basophils Absolute: 0.1 10*3/uL (ref 0.0–0.1)
Basophils Relative: 1 %
Eosinophils Absolute: 0.4 10*3/uL (ref 0.0–0.7)
Eosinophils Relative: 3 %
HEMATOCRIT: 44.6 % (ref 39.0–52.0)
HEMOGLOBIN: 15.3 g/dL (ref 13.0–17.0)
IMMATURE GRANULOCYTES: 1 %
LYMPHS PCT: 21 %
Lymphs Abs: 3 10*3/uL (ref 0.7–4.0)
MCH: 32.1 pg (ref 26.0–34.0)
MCHC: 34.3 g/dL (ref 30.0–36.0)
MCV: 93.7 fL (ref 78.0–100.0)
MONOS PCT: 6 %
Monocytes Absolute: 0.9 10*3/uL (ref 0.1–1.0)
NEUTROS ABS: 9.6 10*3/uL — AB (ref 1.7–7.7)
NEUTROS PCT: 68 %
Platelets: 292 10*3/uL (ref 150–400)
RBC: 4.76 MIL/uL (ref 4.22–5.81)
RDW: 12.2 % (ref 11.5–15.5)
WBC: 14 10*3/uL — ABNORMAL HIGH (ref 4.0–10.5)

## 2018-07-08 LAB — URINALYSIS, ROUTINE W REFLEX MICROSCOPIC
Bilirubin Urine: NEGATIVE
GLUCOSE, UA: NEGATIVE mg/dL
HGB URINE DIPSTICK: NEGATIVE
Ketones, ur: NEGATIVE mg/dL
Leukocytes, UA: NEGATIVE
Nitrite: NEGATIVE
PH: 5 (ref 5.0–8.0)
PROTEIN: NEGATIVE mg/dL
Specific Gravity, Urine: 1.026 (ref 1.005–1.030)

## 2018-07-08 LAB — LIPASE, BLOOD: LIPASE: 43 U/L (ref 11–51)

## 2018-07-08 SURGERY — LAPAROSCOPIC INTERNAL HERNIA REPAIR
Anesthesia: General | Site: Abdomen

## 2018-07-08 MED ORDER — IOPAMIDOL (ISOVUE-300) INJECTION 61%
INTRAVENOUS | Status: AC
  Filled 2018-07-08: qty 100

## 2018-07-08 MED ORDER — SODIUM CHLORIDE 0.9 % IR SOLN
Status: DC | PRN
  Administered 2018-07-08: 1000 mL

## 2018-07-08 MED ORDER — LIDOCAINE 2% (20 MG/ML) 5 ML SYRINGE
INTRAMUSCULAR | Status: DC | PRN
  Administered 2018-07-08: 60 mg via INTRAVENOUS

## 2018-07-08 MED ORDER — SUCCINYLCHOLINE CHLORIDE 200 MG/10ML IV SOSY
PREFILLED_SYRINGE | INTRAVENOUS | Status: DC | PRN
  Administered 2018-07-08: 140 mg via INTRAVENOUS

## 2018-07-08 MED ORDER — ONDANSETRON HCL 4 MG/2ML IJ SOLN: 4.0000 mg | Freq: Four times a day (QID) | INTRAMUSCULAR | Status: DC | PRN

## 2018-07-08 MED ORDER — IOHEXOL 300 MG/ML  SOLN: 100.0000 mL | Freq: Once | INTRAMUSCULAR | Status: DC | PRN

## 2018-07-08 MED ORDER — FENTANYL CITRATE (PF) 100 MCG/2ML IJ SOLN: 25.0000 ug | INTRAMUSCULAR | Status: DC | PRN

## 2018-07-08 MED ORDER — SODIUM CHLORIDE 0.9 % IV SOLN: 2.0000 g | INTRAVENOUS | Status: DC

## 2018-07-08 MED ORDER — PROPOFOL 10 MG/ML IV BOLUS
INTRAVENOUS | Status: DC | PRN
  Administered 2018-07-08: 140 mg via INTRAVENOUS

## 2018-07-08 MED ORDER — FENTANYL CITRATE (PF) 250 MCG/5ML IJ SOLN
INTRAMUSCULAR | Status: DC | PRN
  Administered 2018-07-08: 50 ug via INTRAVENOUS
  Administered 2018-07-08: 100 ug via INTRAVENOUS
  Administered 2018-07-08: 50 ug via INTRAVENOUS
  Administered 2018-07-08: 75 ug via INTRAVENOUS
  Administered 2018-07-08: 50 ug via INTRAVENOUS
  Administered 2018-07-08: 100 ug via INTRAVENOUS
  Administered 2018-07-08: 25 ug via INTRAVENOUS
  Administered 2018-07-08: 50 ug via INTRAVENOUS

## 2018-07-08 MED ORDER — HYDROMORPHONE HCL 1 MG/ML IJ SOLN
1.0000 mg | INTRAMUSCULAR | Status: DC | PRN
  Administered 2018-07-08: 1 mg via INTRAVENOUS
  Filled 2018-07-08: qty 1

## 2018-07-08 MED ORDER — FAMOTIDINE IN NACL 20-0.9 MG/50ML-% IV SOLN: 20.0000 mg | Freq: Two times a day (BID) | INTRAVENOUS | Status: DC

## 2018-07-08 MED ORDER — MIDAZOLAM HCL 2 MG/2ML IJ SOLN
INTRAMUSCULAR | Status: DC | PRN
  Administered 2018-07-08: 1 mg via INTRAVENOUS
  Administered 2018-07-08 (×2): 0.5 mg via INTRAVENOUS

## 2018-07-08 MED ORDER — BUPIVACAINE-EPINEPHRINE 0.5% -1:200000 IJ SOLN
INTRAMUSCULAR | Status: DC | PRN
  Administered 2018-07-08: 10 mL

## 2018-07-08 MED ORDER — IOPAMIDOL (ISOVUE-300) INJECTION 61%
100.0000 mL | Freq: Once | INTRAVENOUS | Status: AC | PRN
  Administered 2018-07-08: 100 mL via INTRAVENOUS

## 2018-07-08 MED ORDER — PROMETHAZINE HCL 25 MG/ML IJ SOLN: 6.2500 mg | INTRAMUSCULAR | Status: DC | PRN

## 2018-07-08 MED ORDER — LACTATED RINGERS IV SOLN
INTRAVENOUS | Status: DC
  Administered 2018-07-08 (×2): via INTRAVENOUS

## 2018-07-08 MED ORDER — MEPERIDINE HCL 50 MG/ML IJ SOLN: 6.2500 mg | INTRAMUSCULAR | Status: DC | PRN

## 2018-07-08 MED ORDER — SUGAMMADEX SODIUM 200 MG/2ML IV SOLN
INTRAVENOUS | Status: DC | PRN
  Administered 2018-07-08: 300 mg via INTRAVENOUS

## 2018-07-08 MED ORDER — SODIUM CHLORIDE 0.9 % IV SOLN
INTRAVENOUS | Status: DC | PRN
  Administered 2018-07-08: 2 g via INTRAVENOUS

## 2018-07-08 MED ORDER — ONDANSETRON HCL 4 MG/2ML IJ SOLN
INTRAMUSCULAR | Status: DC | PRN
  Administered 2018-07-08: 4 mg via INTRAVENOUS

## 2018-07-08 MED ORDER — ROCURONIUM BROMIDE 10 MG/ML (PF) SYRINGE
PREFILLED_SYRINGE | INTRAVENOUS | Status: DC | PRN
  Administered 2018-07-08: 5 mg via INTRAVENOUS
  Administered 2018-07-08: 40 mg via INTRAVENOUS

## 2018-07-08 MED ORDER — DEXAMETHASONE SODIUM PHOSPHATE 10 MG/ML IJ SOLN
INTRAMUSCULAR | Status: DC | PRN
  Administered 2018-07-08: 10 mg via INTRAVENOUS

## 2018-07-08 MED ORDER — CHLORHEXIDINE GLUCONATE CLOTH 2 % EX PADS: 6.0000 | MEDICATED_PAD | Freq: Once | CUTANEOUS | Status: DC

## 2018-07-08 SURGICAL SUPPLY — 44 items
ADH SKN CLS APL DERMABOND .7 (GAUZE/BANDAGES/DRESSINGS)
APPLIER CLIP 5 13 M/L LIGAMAX5 (MISCELLANEOUS)
APR CLP MED LRG 5 ANG JAW (MISCELLANEOUS)
BINDER ABDOMINAL 12 ML 46-62 (SOFTGOODS) ×5 IMPLANT
BLADE CLIPPER SENSICLIP SURGIC (BLADE) ×3 IMPLANT
CHLORAPREP W/TINT 26ML (MISCELLANEOUS) ×5 IMPLANT
CLIP APPLIE 5 13 M/L LIGAMAX5 (MISCELLANEOUS) IMPLANT
COVER SURGICAL LIGHT HANDLE (MISCELLANEOUS) ×5 IMPLANT
DECANTER SPIKE VIAL GLASS SM (MISCELLANEOUS) ×5 IMPLANT
DERMABOND ADVANCED (GAUZE/BANDAGES/DRESSINGS)
DERMABOND ADVANCED .7 DNX12 (GAUZE/BANDAGES/DRESSINGS) ×2 IMPLANT
DEVICE SECURE STRAP 25 ABSORB (INSTRUMENTS) IMPLANT
DEVICE TROCAR PUNCTURE CLOSURE (ENDOMECHANICALS) ×2 IMPLANT
DISSECTOR BLUNT TIP ENDO 5MM (MISCELLANEOUS) IMPLANT
ELECT REM PT RETURN 15FT ADLT (MISCELLANEOUS) ×5 IMPLANT
GAUZE SPONGE 4X4 12PLY STRL (GAUZE/BANDAGES/DRESSINGS) ×3 IMPLANT
GLOVE EUDERMIC 7 POWDERFREE (GLOVE) ×5 IMPLANT
GLOVE SURG ORTHO 8.0 STRL STRW (GLOVE) ×3 IMPLANT
GOWN STRL REUS W/TWL XL LVL3 (GOWN DISPOSABLE) ×15 IMPLANT
KIT BASIN OR (CUSTOM PROCEDURE TRAY) ×5 IMPLANT
MARKER SKIN DUAL TIP RULER LAB (MISCELLANEOUS) ×5 IMPLANT
MESH ULTRAPRO 3X6 7.6X15CM (Mesh General) ×3 IMPLANT
NDL SPNL 22GX3.5 QUINCKE BK (NEEDLE) ×2 IMPLANT
NEEDLE SPNL 22GX3.5 QUINCKE BK (NEEDLE) ×5 IMPLANT
PAD ABD 8X10 STRL (GAUZE/BANDAGES/DRESSINGS) ×3 IMPLANT
PAD POSITIONING PINK XL (MISCELLANEOUS) IMPLANT
POSITIONER SURGICAL ARM (MISCELLANEOUS) IMPLANT
SCISSORS LAP 5X35 DISP (ENDOMECHANICALS) ×5 IMPLANT
SET IRRIG TUBING LAPAROSCOPIC (IRRIGATION / IRRIGATOR) IMPLANT
SHEARS HARMONIC ACE PLUS 36CM (ENDOMECHANICALS) IMPLANT
SLEEVE XCEL OPT CAN 5 100 (ENDOMECHANICALS) ×5 IMPLANT
STAPLER VISISTAT 35W (STAPLE) ×3 IMPLANT
SUT NOVA NAB DX-16 0-1 5-0 T12 (SUTURE) IMPLANT
SUT NOVA NAB GS-21 0 18 T12 DT (SUTURE) IMPLANT
SUT NOVA T20/GS 25 (SUTURE) ×3 IMPLANT
SUT PDS AB 1 CT1 27 (SUTURE) ×3 IMPLANT
SUT PROLENE 0 CT 1 CR/8 (SUTURE) ×6 IMPLANT
TAPE CLOTH 4X10 WHT NS (GAUZE/BANDAGES/DRESSINGS) ×3 IMPLANT
TOWEL OR 17X26 10 PK STRL BLUE (TOWEL DISPOSABLE) ×5 IMPLANT
TRAY LAPAROSCOPIC (CUSTOM PROCEDURE TRAY) ×5 IMPLANT
TROCAR BLADELESS OPT 5 100 (ENDOMECHANICALS) ×5 IMPLANT
TROCAR XCEL BLUNT TIP 100MML (ENDOMECHANICALS) IMPLANT
TROCAR XCEL NON-BLD 11X100MML (ENDOMECHANICALS) IMPLANT
TUBING INSUF HEATED (TUBING) ×3 IMPLANT

## 2018-07-08 NOTE — Anesthesia Procedure Notes (Signed)
Date/Time: 07/08/2018 11:17 PM Performed by: Cynda Familia, CRNA Oxygen Delivery Method: Simple face mask Placement Confirmation: positive ETCO2 and breath sounds checked- equal and bilateral Dental Injury: Teeth and Oropharynx as per pre-operative assessment

## 2018-07-08 NOTE — ED Notes (Signed)
Spoke with CT, pt is next to be scanned.

## 2018-07-08 NOTE — Anesthesia Procedure Notes (Signed)
Procedure Name: Intubation Date/Time: 07/08/2018 10:08 PM Performed by: Cynda Familia, CRNA Pre-anesthesia Checklist: Patient identified, Emergency Drugs available, Suction available and Patient being monitored Patient Re-evaluated:Patient Re-evaluated prior to induction Oxygen Delivery Method: Circle System Utilized Preoxygenation: Pre-oxygenation with 100% oxygen Induction Type: IV induction, Cricoid Pressure applied and Rapid sequence Grade View: Grade I Tube type: Oral Tube size: 7.5 mm Number of attempts: 1 Airway Equipment and Method: Stylet and Video-laryngoscopy Placement Confirmation: ETT inserted through vocal cords under direct vision,  positive ETCO2 and breath sounds checked- equal and bilateral Secured at: 21 cm Tube secured with: Tape Dental Injury: Teeth and Oropharynx as per pre-operative assessment  Comments: Smooth RSI-- Royce Macadamia applied cricoid pressure-- intubation via Glidescope-- atraumatic-- teeth and mouth as preop--- bilat BS Royce Macadamia

## 2018-07-08 NOTE — ED Provider Notes (Signed)
[facility] EMERGENCY DEPARTMENT Provider Note   CSN: [id number] Arrival date & time: 07/08/18  1409     History   Chief Complaint Chief Complaint  Patient presents with  . Abdominal Pain    HPI Tyler Lawrence is a 72 y.o. male.  HPI    72 year old male presents today with complaints of abdominal pain. Patient had to be going hernia repaired approximately one month ago performed by Dr. Dalbert Batman. Patient notes he was back to work today no strenuous activity. He felt pain in his right lower quadrant with a bolus was not present prior. He notes 2 bowel movements today, none since the onset of symptoms, denies any nausea vomiting or fever.   Past Medical History:  Diagnosis Date  . Arthritis    HANDS HURT.  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 Back pain    PT STATES HE HAS FLARE UPS OF LOWER BACK AND LEFT LEG PAIN -MAYBE A LITTLE NUMBNESS--PT STATES HE WAS TOLD HE HAS A PINCHED NERVE.  Marland Kitchen CAD (coronary artery disease)    DR. [place]; CABG 2001,HEART CATH 2005 -PATENT GRAFTS, "LUMINAL IRREGULARITY IN THE VEIN TO THE DIAGONAL VESSEL.  HIS RCA WHICH WAS NONDOMINANT, HAD 78% STENOSIS."  NUCLEAR STRESS TEST 04/04/12--"ESSENTIALLY UNCHANGED FROM PRIOR STUDY AND SHOWED A SMALL PERFUSION DEFECT IN THE BASAL ANTERIOR AND BASAL ANTEROSEPTAL REGION CONSISTENT WITH PROXIMAL LAD SCAR. "  . Complication of anesthesia    PT STATES SURGERY [facility]  SPINAL ATTEMPTED FOR HEMORRHOIDECTOMY AND HERNIA REPAIR.  PT STATES WHEN NEEDLE WAS PUT IN HIS BACK - HE FELT ELECTRICAL SHOCK DOWN LEFT GROIN AND LEFT LEG.  PT STATES THAT HE WAS THEN PUT TO  SLEEP.  . Fever blister 07/25/12   ON UPPER LIP  . GERD (gastroesophageal reflux disease)   . Heart palpitations    occasional   . Hemorrhoid    ARE ITCHING AND UNCOMFORTABLE  . Hypertension   . Left bundle branch block    CHRONIC  . Protruding sternal wires   . S/P  CABG x 3 01/26/2000   LIMA to LAD, SVG to D1, SVG to OM1  . 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 Weakness of left arm    slight left arm weakness      Patient Active Problem List   Diagnosis Date Noted  . Abdominal pain 07/08/2018  . 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 Protruding sternal wires   . Rectal bleeding 03/30/2014  . Unspecified constipation 03/30/2014  . Hyperplastic rectal polyp 09/30/2013  . CAD (coronary artery disease) 05/06/2013  . Hyperlipemia, mixed 05/06/2013  . Essential hypertension 05/06/2013  . LBBB (left bundle branch block) 05/06/2013  . External hemorrhoids with complication 37/85/8850  . Internal bleeding hemorrhoids 07/26/2012  . S/P CABG x 3 01/26/2000    Past Surgical History:  Procedure Laterality Date  . APPENDECTOMY    . CARDIAC CATHETERIZATION  02/2004  . COLONOSCOPY     remote past, can't remember  . COLONOSCOPY N/A 04/20/2014   Dr. Gala Romney- grade 3 hemorrhoids, o/w normal ileocolonoscopy  . CORONARY ARTERY BYPASS GRAFT  01/26/2000  . ELBOW SURGERY     for a chipped bone  . EYE SURGERY Bilateral    Cataracts  . HEMORRHOID SURGERY  07/26/2012   Dr. Dalbert Batman  . HEMORRHOID SURGERY N/A 06/18/2015   Procedure: INTERNAL AND EXTERNAL HEMORRHOIDECTOMY SINGLE CLOUMN;  Surgeon: Fanny Skates, MD;  Location: [facility];  Service: General;  Laterality: N/A;  . HEMORROIDECTOMY     MANY YEARS AGO  . HERNIA REPAIR Right 06/11/2018    LAPAROSCOPIC ASSISTED RIGHT SPIGELIAN HERNIA REPAIR (Right Abdomen)  . INSERTION OF MESH Right 06/11/2018   Procedure: INSERTION OF MESH;  Surgeon: Fanny Skates, MD;  Location: [facility];  Service: General;  Laterality: Right;  GENERAL AND TAP BLOCK  . LAPAROSCOPIC ASSISTED SPIGELIAN HERNIA REPAIR Right 06/11/2018   Procedure: LAPAROSCOPIC ASSISTED RIGHT SPIGELIAN HERNIA REPAIR;  Surgeon: Fanny Skates, MD;  Location: [facility];  Service: General;  Laterality: Right;  GENERAL AND TAP BLOCK  . Rt hernia repair in March of 2012     STATES HE ALSO HAD  HEMORRHODECTOMY AT THE SAME TIME  . STERNAL WIRES REMOVAL N/A 03/13/2017   Procedure: STERNAL WIRES REMOVAL;  Surgeon: Rexene Alberts, MD;  Location: [facility];  Service: Thoracic;  Laterality: N/A;        Home Medications    Prior to Admission medications   Medication Sig Start Date End Date Taking? Authorizing Provider  acetaminophen (TYLENOL) 325 MG tablet Take 650 mg by mouth every 6 (six) hours as needed for mild pain.   Yes [provider]  amLODipine (NORVASC) 5 MG tablet Take 1 tablet (5 mg total) by mouth daily. 04/05/18 07/08/18 Yes Troy Sine, MD  aspirin EC 81 MG tablet Take 81 mg by mouth at bedtime.    Yes [provider]  losartan (COZAAR) 100 MG tablet Take 100 mg by mouth at bedtime.  04/09/15  Yes [provider]  metoprolol succinate (TOPROL-XL) 50 MG 24 hr tablet TAKE 1 TABLET BY MOUTH EVERY DAY 12/18/16  Yes Troy Sine, MD  Multiple Vitamins-Minerals (MULTIVITAMIN WITH MINERALS) tablet Take 1 tablet by mouth daily.   Yes [provider]  simvastatin (ZOCOR) 20 MG tablet Take 1 tablet (20 mg total) by mouth daily at 6 PM. PLEASE CONTACT THE OFFICE FOR ADDITIONAL REFILLS 01/03/17  Yes Troy Sine, MD    Family History Family History  Problem Relation Age of Onset  . Heart disease Father   . Heart failure Father   . Heart failure Mother   . Heart attack Mother   . Diabetes Mother   . Heart disease Paternal Grandfather   . Heart attack Maternal Grandmother   . Colon cancer Neg Hx     Social History Social History   Tobacco Use  . Smoking status: Former Smoker    Packs/day: 2.00    Years: 10.00    Pack years: 20.00  . Smokeless tobacco: Never Used  . Tobacco comment: QUIT 1991  Substance Use Topics  . Alcohol use: No  . Drug use: No     Allergies   Patient has no known allergies.   Review of Systems Review of Systems  All other systems reviewed and are negative.    Physical Exam Updated Vital  Signs BP (!) 151/88 (BP Location: Right Arm)   Pulse 83   Temp 98.8 F (37.1 C) (Oral)   Resp 18   SpO2 97%   Physical Exam  Constitutional: He is oriented to person, place, and time. He appears well-developed and well-nourished.  HENT:  Head: Normocephalic and atraumatic.  Eyes: Pupils are equal, round, and reactive to light. Conjunctivae are normal. Right eye exhibits no discharge. Left eye exhibits no discharge. No scleral icterus.  Neck: Normal range of motion. No JVD present. No tracheal deviation present.  Pulmonary/Chest: Effort normal. No stridor.  Abdominal:  Tenderness to right lower quadrant with mass noted, no surrounding bruising remainder abdomen soft nontender  Neurological: He is alert and oriented to person, place, and time. Coordination normal.  Psychiatric: He has a normal mood and affect. His behavior is normal. Judgment and thought content normal.  Nursing note and vitals reviewed.    ED Treatments / Results  Labs (all labs ordered are listed, but only abnormal results are displayed) Labs Reviewed  CBC WITH DIFFERENTIAL/PLATELET - Abnormal; Notable for the following components:      Result Value   WBC 14.0 (*)    Neutro Abs 9.6 (*)    All other components within normal limits  COMPREHENSIVE METABOLIC PANEL - Abnormal; Notable for the following components:   Glucose, Bld 140 (*)    All other components within normal limits  LIPASE, BLOOD  URINALYSIS, ROUTINE W REFLEX MICROSCOPIC  HIV ANTIBODY (ROUTINE TESTING)  COMPREHENSIVE METABOLIC PANEL  CBC    EKG None  Radiology No results found.  Procedures Procedures (including critical care time)  Medications Ordered in ED Medications  lactated ringers infusion (has no administration in time range)  HYDROmorphone (DILAUDID) injection 1 mg (has no administration in time range)  famotidine (PEPCID) IVPB 20 mg premix (has no administration in time range)  ondansetron (ZOFRAN) injection 4 mg (has no  administration in time range)     Initial Impression / Assessment and Plan / ED Course  I have reviewed the triage vital signs and the nursing notes.  Pertinent labs & imaging results that were available during my care of the patient were reviewed by me and considered in my medical decision making (see chart for details).     72 year old male presents today with abdominal pain. Concern for recurrence of hernia. Patient was seen by Dr. Dalbert Batman during the ED who recommended CT scan and hospitalization admission. Patient pending CT scan and room availability.  Final Clinical Impressions(s) / ED Diagnoses   Final diagnoses:  Right lower quadrant abdominal pain    ED Discharge Orders    None       Francee Gentile 07/08/18 [place], [place], DO 07/08/18 2341

## 2018-07-08 NOTE — ED Notes (Addendum)
Dr. Dalbert Batman at beside talking to patient. Report given to [facility] in [place].

## 2018-07-08 NOTE — H&P (Signed)
General Surgery [facility]  Chief Complaint  Patient presents with  . Abdominal Pain    HISTORY:  This is a pleasant 72 year old man well-known to me.  Dr. Kerin Perna is his PCP.  Dr. Ellouise Newer is his cardiologist.    Significant recent past history is laparoscopic repair of right spigelian hernia by me with Dr. Rosendo Gros assisting approximately 1 month ago.  This was done with mesh.  Large peritoneal flap was pulled down and the mesh was placed over the spigelian hernia and tacked in place..  He had previously had laparoscopic inguinal hernia repairs by Dr. gross and the old mesh was in place in the inferior lateral aspect and we could not dissected that out well so the overlap of the recent mesh was not as good as possible.  I saw him in the office last week and he was doing well.  He has been eating normally.  Having normal bowel movements.  Voiding uneventfully.           He went back to work today but did nothing strenuous.  This afternoon he felt some pain and a bulge in the right lower quadrant.  He said it was fairly severe when he stood up but was only a 2 out of 10 when supine.  He has had no vomiting.  He says he might feel a little nauseated.  He also has some discomfort in his upper abdomen and feels some gurgling.  I asked him to come to the emergency room to make sure he did not have an acute recurrence of his spigelian hernia.    Comorbidities include CABG.  Recent echocardiogram and stress test were done and looked normal and he was cleared for surgery by Dr. Claiborne Billings.  Hypertension on losartan and metoprolol.  Hyperlipidemia on simvastatin.  Takes aspirin daily.  Inguinal hernia repair 2012 by Dr. Romona Curls.  Dr. Richardson Landry gross performed bilateral laparoscopic inguinal hernia repairs perhaps 1 year ago.  Laparoscopic appendectomy by Dr. Arnoldo Morale many years ago.     When I examined him in the emergency room this afternoon there was a soft bulge when he stood but mostly this  seemed to be reducible.  I wondered if there might be a tiny component that did not reduce.  I was concerned that there might be some partial incarceration difficult to diagnose because of the intact external oblique.  He is being admitted for CT scan abdomen and pelvis to define the anatomy.     WBC is 14,000.  The rest of his labs are fine      I told him that if he had a recurrent hernia he might need an operation under general anesthesia and this might need to be done open with an incision in the lower abdomen.     Admitted for further evaluation and management    Past Medical History:  Diagnosis Date  . Arthritis    HANDS HURT.  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 Back pain    PT STATES HE HAS FLARE UPS OF LOWER BACK AND LEFT LEG PAIN -MAYBE A LITTLE NUMBNESS--PT STATES HE WAS TOLD HE HAS A PINCHED NERVE.  Marland Kitchen CAD (coronary artery disease)    DR. [place]; CABG 2001,HEART CATH 2005 -PATENT GRAFTS, "LUMINAL IRREGULARITY IN THE VEIN TO THE DIAGONAL VESSEL.  HIS RCA WHICH WAS NONDOMINANT, HAD 78% STENOSIS."  NUCLEAR STRESS TEST 04/04/12--"ESSENTIALLY UNCHANGED FROM  PRIOR STUDY AND SHOWED A SMALL PERFUSION DEFECT IN THE BASAL ANTERIOR AND BASAL ANTEROSEPTAL REGION CONSISTENT WITH PROXIMAL LAD SCAR. "  . Complication of anesthesia    PT STATES SURGERY AT [facility] 2012  SPINAL ATTEMPTED FOR HEMORRHOIDECTOMY AND HERNIA REPAIR.  PT STATES WHEN NEEDLE WAS PUT IN HIS BACK - HE FELT ELECTRICAL SHOCK DOWN LEFT GROIN AND LEFT LEG.  PT STATES THAT HE WAS THEN PUT TO  SLEEP.  . Fever blister 07/25/12   ON UPPER LIP  . GERD (gastroesophageal reflux disease)   . Heart palpitations    occasional   . Hemorrhoid    ARE ITCHING AND UNCOMFORTABLE  . Hypertension   . Left bundle branch block    CHRONIC  . Protruding sternal wires   . 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 Weakness of left  arm    slight left arm weakness      Current Facility-Administered Medications  Medication Dose Route Frequency Provider Last Rate Last Dose  . famotidine (PEPCID) IVPB 20 mg premix  20 mg Intravenous Q12H Fanny Skates, MD      . HYDROmorphone (DILAUDID) injection 1 mg  1 mg Intravenous Q2H PRN Fanny Skates, MD      . lactated ringers infusion   Intravenous Continuous Fanny Skates, MD      . ondansetron [facility]) injection 4 mg  4 mg Intravenous Q6H PRN Fanny Skates, MD       Current Outpatient Medications  Medication Sig Dispense Refill  . acetaminophen (TYLENOL) 325 MG tablet Take 650 mg by mouth every 6 (six) hours as needed for mild pain.    Marland Kitchen amLODipine (NORVASC) 5 MG tablet Take 1 tablet (5 mg total) by mouth daily. 90 tablet 3  . aspirin EC 81 MG tablet Take 81 mg by mouth at bedtime.     Marland Kitchen losartan (COZAAR) 100 MG tablet Take 100 mg by mouth at bedtime.   0  . metoprolol succinate (TOPROL-XL) 50 MG 24 hr tablet TAKE 1 TABLET BY MOUTH EVERY DAY 90 tablet 2  . Multiple Vitamins-Minerals (MULTIVITAMIN WITH MINERALS) tablet Take 1 tablet by mouth daily.    . simvastatin (ZOCOR) 20 MG tablet Take 1 tablet (20 mg total) by mouth daily at 6 PM. PLEASE CONTACT THE OFFICE FOR ADDITIONAL REFILLS 30 tablet 0    No Known Allergies  Family History  Problem Relation Age of Onset  . Heart disease Father   . Heart failure Father   . Heart failure Mother   . Heart attack Mother   . Diabetes Mother   . Heart disease Paternal Grandfather   . Heart attack Maternal Grandmother   . Colon cancer Neg Hx     Social History   Socioeconomic History  . Marital status: Married    Spouse name: Not on file  . Number of children: Not on file  . Years of education: Not on file  . Highest education level: Not on file  Occupational History  . Not on file  Social Needs  . Financial resource strain: Not on file  . Food insecurity:    Worry: Not on file    Inability: Not on file  .  Transportation needs:    Medical: Not on file    Non-medical: Not on file  Tobacco Use  . Smoking status: Former Smoker    Packs/day: 2.00  Years: 10.00    Pack years: 20.00  . Smokeless tobacco: Never Used  . Tobacco comment: QUIT 1991  Substance and Sexual Activity  . Alcohol use: No  . Drug use: No  . Sexual activity: Not on file  Lifestyle  . Physical activity:    Days per week: Not on file    Minutes per session: Not on file  . Stress: Not on file  Relationships  . Social connections:    Talks on phone: Not on file    Gets together: Not on file    Attends religious service: Not on file    Active member of club or organization: Not on file    Attends meetings of clubs or organizations: Not on file    Relationship status: Not on file  Other Topics Concern  . Not on file  Social History Narrative  . Not on file    REVIEW OF SYSTEMS - PERTINENT POSITIVES ONLY: 15 system review of systems is performed and is negative except as described above in the history.  EXAM: Vitals:   07/08/18 1419  BP: (!) 151/88  Pulse: 83  Resp: 18  Temp: 98.8 F (37.1 C)  SpO2: 97%    GENERAL: well-developed, well-nourished, no acute distress.  Stoic, as usual. HEENT: normocephalic; pupils equal and reactive; sclerae clear; dentition good; mucous membranes moist NECK:  symmetric on extension; no palpable anterior or posterior cervical lymphadenopathy; no supraclavicular masses; no tenderness CHEST: clear to auscultation bilaterally without rales, rhonchi, or wheezes CARDIAC: regular rate and rhythm without significant murmur; peripheral pulses are full.  Midline sternotomy incision well-healed. ABDOMEN: Abdomen is soft and not really tender.  No obvious organomegaly.  Does not seem to be distended.  Bowel sounds are present.  When he stands I think I feel a bulge in the right lower quadrant just above the inguinal ligament.  This is soft and reducible when he lies down.  This might be  a recurrent hernia.  Old right groin scar.  Scar at umbilicus.  Healed trocar sites.                          EXT:  non-tender without edema; no deformity NEURO: no gross focal deficits; no sign of tremor   LABORATORY RESULTS: See [facility] (CHL-Epic) for most recent results  RADIOLOGY RESULTS: See [facility] (CHL-Epic) for most recent results    IMPRESSION:  Abdominal pain.  Possible recurrent spigelian hernia History laparoscopic repair right spigelian hernia, recent, 1 month postop  History open right inguinal hernia repair, remote History laparoscopic repair bilateral inguinal hernias with mesh, 2016 CAD.  Status post CABG.  Stable cardiac wise. History of appendectomy History of hemorrhoidectomy Left bundle branch block    PLAN: Admit for lab work and immediate CT scan to rule out occult incarceration recurrence of his spigelian hernia NPO   Primary Care Physician: Redmond School, MD

## 2018-07-08 NOTE — Op Note (Signed)
Patient Name:           Tyler Lawrence   Date of Surgery:        07/08/2018  Pre op Diagnosis:      Incarcerated, recurrent, right spigelian hernia  Post op Diagnosis:    Incarcerated, recurrent, right spigelian hernia with early small bowel obstruction  Procedure:                 Diagnostic laparoscopy, open reduction and repair of recurrent, incarcerated right spigelian hernia with ultra Pro mesh   Surgeon:                     Edsel Petrin. Dalbert Batman, M.D., FACS  Assistant:                      Armandina Gemma, MD   Indication for Assistant: Complex anatomy, emergency surgery, assistant indicated to provide exposure and reduce incidence of intraoperative complications  Operative Indications:   This is a 72 year old man who I have known for many years.  He had undergone an open right inguinal hernia repair many years ago in [place].  He underwent laparoscopic repair of bilateral inguinal hernias with mesh by Dr. gross about 2 years ago.  I took him to the operating room about 5 weeks ago for elective laparoscopic repair of a right spigelian hernia.  We placed a large piece of mesh but due to the scar tissue and old mesh from the inguinal hernias I could not overlap the inferior area as much as I would like.  He did well.  He did have a little bit of pain but today he had as significant increase in right lower quadrant pain.  He came to the emergency room.  White count was elevated to 14,000.  There was a tender mass in the right lower quadrant.  CT scan confirmed that he had incarcerated small bowel.  He was brought to the operating room urgently  Operative Findings:       There was a loop of small bowel incarcerated in the recurrent hernia.  This had herniated under the inferior edge of the mesh.  It was completely incarcerated and edematous and there was a little bit of serosal hemorrhage the bowel was pink, had good peristalsis and had good tissue thickness and integrity and was not ischemic.  There  was no evidence of any other gross intra-abdominal problem by laparoscopy.  There was a little bit of serosanguineous fluid in the pelvis but no inflammatory changes.  We put the bowel back in the abdomen.  After repairing the hernia we repeated the laparoscopy and it looked perfectly viable.  We were able to close the peritoneum and the internal oblique in 2 separate layers.  We placed ultra pro mesh in between the internal and external oblique and then closed the external oblique on top.  This seemed to be a very good technical repair  Procedure in Detail:          Following the induction of general endotracheal anesthesia, the Foley catheter was placed.  The abdomen was extensively prepped and draped in a sterile fashion.  Intravenous antibiotics were given.  Surgical timeout was performed.     A small stab wound was made in the left subcostal area.  A Veress needle was inserted and tested.  Insufflation to 15 mmHg was accomplished.  After removing the Veress needle we inserted a 5 mm optical trocar and then surveyed  the abdomen.  4 Quadrant Inspection revealed no evidence of bleeding or injury from the trocar insertion.  We could visualize the small bowel caught in the hernia but we could not forcibly reduce this.       We made an oblique incision in the right lower quadrant in the direction of the fibers of the external oblique.  Dissection was carried down to the external oblique.  The external oblique  was incised and dissected away from the underlying tissues.  We could see the bulge from the recurrent hernia.  We slowly dissected into this hernia sac and then can see the small bowel.  We opened up the internal oblique layer medially and laterally.  We can see the lower end of the mesh we trimmed that out of the way so that it was not part of the subsequent repair.  We placed a Kelly clamp under the fascia medially and incised it and  that allowed [place] to free up the small bowel.  We brought the small  bowel  into the operative wound and inspected it..  We can see a narrow area where it was beginning to become obstructed but the stricture was open and we could milk small bowel content above and below.  The bowel appeared pink and viable.  We returned the small bowel to the abdominal cavity.  We closed the peritoneal layer with a running suture of #1 PDS.  We closed the internal oblique with a running suture of #1 Novafil.  We brought a 3 inch x 6 inch piece of ultra pro mesh to the operative field and cut it in a somewhat elliptical shape.  The mesh was then sutured down on top of the internal oblique with multiple interrupted sutures of 0 Prolene.  The external oblique was then closed on top of the mesh with a running suture of #1 Novafil.  Irrigating each layer we then closed the skin with skin staples.    We reinsufflated and took a look at the small bowel and it looked viable and we were comfortable with this.  The pneumoperitoneum was released.  The trocar was removed.  The skin staples placed at the trocar site.      The patient tolerated the procedure well was taken to PACU in stable condition.  EBL 25 cc.  Counts correct.  Complications none.     Edsel Petrin. Dalbert Batman, M.D., FACS General and Minimally Invasive Surgery Breast and Colorectal Surgery  07/08/2018 11:27 PM

## 2018-07-08 NOTE — Progress Notes (Signed)
Surgery:  CT scan shows a loop of small bowel incarcerated in right lower quadrant abdominal wall hernia. There is some fluid and inflammation present but no signs of perforation or ischemia or obstruction.  Most likely this is a recurrence of the spigelian hernia.  Most likely the external oblique is intact  He will need to be operated on tonight. I discussed this with him.  Probably the hernia will need to be repaired and reduced and open technique with the right lower quadrant incision but I may need to do laparoscopy to check the bowel.  I explained this to him.  He is in full agreement. Because [facility] is without power except for emergency generator power we are going to  transfer him to [facility].  They have full capacity and are preparing for this surgery  We will proceed with open repair of incarcerated recurrent spigelian hernia, possible laparoscopy, possible bowel resection I discussed the indications, details, techniques, and numerous risk of the surgery with him.  He is aware of the risk of bleeding, infection, subsequent recurrence of the hernia, injury to adjacent organs with major reconstructive surgery and bowel resection and other unforeseen problems.  He understands all of these issues well.  All of his questions were answered.  He agrees with this plan.  He is calling his family to discuss this with them.  He has coronary artery disease and has had CABG in the past. He was recently evaluated by Dr. Claiborne Billings, his cardiologist and was cleared for surgery 6 weeks ago.   Tyler Lawrence. Dalbert Batman, M.D., [facility] General and Minimally invasive Surgery Breast and Colorectal Surgery Office:   [phone] Pager:   [phone]

## 2018-07-08 NOTE — ED Triage Notes (Signed)
Pt presents for evaluation of hernia. Pt reports he had hernia repair on 7/23, today he bent over to pick something up and felt a knot and has had severe pain. Sent here for further eval from his surgeon.

## 2018-07-08 NOTE — Consult Note (Signed)
[facility] called for transport to [facility]

## 2018-07-08 NOTE — Anesthesia Preprocedure Evaluation (Addendum)
Anesthesia Evaluation  Patient identified by MRN, date of birth, ID band Patient awake    Reviewed: Allergy & Precautions, NPO status , Patient's Chart, lab work & pertinent test results, reviewed documented beta blocker date and time   History of Anesthesia Complications (+) PONV and history of anesthetic complications  Airway Mallampati: II  TM Distance: >3 FB Neck ROM: Full    Dental no notable dental hx. (+) Teeth Intact, Dental Advisory Given, Caps   Pulmonary neg pulmonary ROS, former smoker,    Pulmonary exam normal breath sounds clear to auscultation       Cardiovascular Exercise Tolerance: Good hypertension, Pt. on medications and Pt. on home beta blockers + CAD and + CABG  Normal cardiovascular exam+ dysrhythmias  Rhythm:Regular Rate:Normal  EKG: LBBB  Echo: 01/03/2018 Left ventricle: The cavity size was normal. Wall thickness was  normal. Systolic function was normal. The estimated ejection  fraction was in the range of 55% to 60%. Doppler parameters are consistent with abnormal left ventricular relaxation (grade 1 diastolic dysfunction). - Left atrium: The atrium was mildly dilated.  Myocardia perfusion Scan: 11/21/2017  The left ventricular ejection fraction is mildly decreased (45-54%).  Nuclear stress EF: 47%.  Defect 1: There is a medium defect of moderate severity present in the basal anterior, mid anterior, apical anterior and apical septal location.  Findings consistent with ischemia.  This is an intermediate risk study.  Protruding sternal wires   Neuro/Psych negative neurological ROS  negative psych ROS   GI/Hepatic Neg liver ROS, GERD  ,  Endo/Other  Hyperlipidemia  Renal/GU negative Renal ROS     Musculoskeletal  (+) Arthritis , Osteoarthritis,  Incarcerated abdominal wall hernia   Abdominal   Peds  Hematology negative hematology ROS (+)   Anesthesia Other Findings    Reproductive/Obstetrics                           Anesthesia Physical  Anesthesia Plan  ASA: III  Anesthesia Plan: General   Post-op Pain Management:    Induction: Intravenous, Rapid sequence and Cricoid pressure planned  PONV Risk Score and Plan: 2 and Ondansetron, Dexamethasone and Treatment may vary due to age or medical condition  Airway Management Planned: Oral ETT  Additional Equipment:   Intra-op Plan:   Post-operative Plan: Extubation in OR  Informed Consent: I have reviewed the patients History and Physical, chart, labs and discussed the procedure including the risks, benefits and alternatives for the proposed anesthesia with the patient or authorized representative who has indicated his/her understanding and acceptance.   Dental advisory given  Plan Discussed with: CRNA and Surgeon  Anesthesia Plan Comments:        Anesthesia Quick Evaluation

## 2018-07-08 NOTE — Transfer of Care (Signed)
Immediate Anesthesia Transfer of Care Note  Patient: Tyler Lawrence  Procedure(s) Performed: LAPAROSCOPIC INTERNAL HERNIA REPAIR (N/A ) LAPAROSCOPIC INCISIONAL HERNIA (N/A )  Patient Location: PACU  Anesthesia Type:General  Level of Consciousness: awake and alert   Airway & Oxygen Therapy: Patient Spontanous Breathing and Patient connected to face mask oxygen  Post-op Assessment: Report given to RN and Post -op Vital signs reviewed and stable  Post vital signs: Reviewed and stable  Last Vitals:  Vitals Value Taken Time  BP    Temp    Pulse    Resp    SpO2      Last Pain:  Vitals:   07/08/18 2038  TempSrc: Oral  PainSc:          Complications: No apparent anesthesia complications

## 2018-07-08 NOTE — ED Provider Notes (Signed)
Patient placed in Quick Look pathway, seen and evaluated   Chief Complaint: abdominal pain  HPI:   72 year old male presents with acute onset of RLQ and periumbilical abdominal pain after bending over this morning and picking up something light. He had a hernia repair on 7/23 by Dr. Dalbert Batman. He called Dr. Dalbert Batman and was told to come to the ED. He is s/p appendectomy and several other hernia repairs.  ROS: +abdominal pain  Physical Exam:   Gen: No distress  Neuro: Awake and Alert  Skin: Warm    Focused Exam: Abdomen: Soft, tender over RLQ and periumbilical area. Palpable knot in the RLQ   Initiation of care has begun. The patient has been counseled on the process, plan, and necessity for staying for the completion/evaluation, and the remainder of the medical screening examination    Recardo Evangelist, PA-C 07/08/18 [place], MD 07/08/18 2029

## 2018-07-09 ENCOUNTER — Other Ambulatory Visit: Payer: Self-pay

## 2018-07-09 ENCOUNTER — Encounter (HOSPITAL_COMMUNITY): Payer: Self-pay | Admitting: General Surgery

## 2018-07-09 LAB — CBC
HEMATOCRIT: 41.4 % (ref 39.0–52.0)
Hemoglobin: 14.4 g/dL (ref 13.0–17.0)
MCH: 32.1 pg (ref 26.0–34.0)
MCHC: 34.8 g/dL (ref 30.0–36.0)
MCV: 92.4 fL (ref 78.0–100.0)
Platelets: 235 10*3/uL (ref 150–400)
RBC: 4.48 MIL/uL (ref 4.22–5.81)
RDW: 12.6 % (ref 11.5–15.5)
WBC: 12.7 10*3/uL — ABNORMAL HIGH (ref 4.0–10.5)

## 2018-07-09 LAB — HIV ANTIBODY (ROUTINE TESTING W REFLEX): HIV Screen 4th Generation wRfx: NONREACTIVE

## 2018-07-09 LAB — CREATININE, SERUM
Creatinine, Ser: 0.91 mg/dL (ref 0.61–1.24)
GFR calc Af Amer: >60 mL/min (ref >60)
GFR calc non Af Amer: >60 mL/min (ref >60)

## 2018-07-09 MED ORDER — HYDROCODONE-ACETAMINOPHEN 5-325 MG PO TABS: 1.0000 | ORAL_TABLET | ORAL | Status: DC | PRN

## 2018-07-09 MED ORDER — SENNA 8.6 MG PO TABS
1.0000 | ORAL_TABLET | Freq: Two times a day (BID) | ORAL | Status: DC
  Administered 2018-07-09 – 2018-07-10 (×4): 8.6 mg via ORAL
  Filled 2018-07-09 (×4): qty 1

## 2018-07-09 MED ORDER — SODIUM CHLORIDE 0.9 % IV SOLN
2.0000 g | Freq: Two times a day (BID) | INTRAVENOUS | Status: AC
  Administered 2018-07-09: 2 g via INTRAVENOUS
  Filled 2018-07-09: qty 2

## 2018-07-09 MED ORDER — TRAMADOL HCL 50 MG PO TABS
50.0000 mg | ORAL_TABLET | Freq: Four times a day (QID) | ORAL | Status: DC | PRN
  Administered 2018-07-10 – 2018-07-11 (×3): 50 mg via ORAL
  Filled 2018-07-09 (×3): qty 1

## 2018-07-09 MED ORDER — ONDANSETRON HCL 4 MG/2ML IJ SOLN: 4.0000 mg | Freq: Four times a day (QID) | INTRAMUSCULAR | Status: DC | PRN

## 2018-07-09 MED ORDER — ENOXAPARIN SODIUM 40 MG/0.4ML ~~LOC~~ SOLN
40.0000 mg | SUBCUTANEOUS | Status: DC
  Administered 2018-07-09 – 2018-07-10 (×2): 40 mg via SUBCUTANEOUS
  Filled 2018-07-09 (×2): qty 0.4

## 2018-07-09 MED ORDER — GABAPENTIN 300 MG PO CAPS
300.0000 mg | ORAL_CAPSULE | Freq: Two times a day (BID) | ORAL | Status: DC
  Administered 2018-07-09 – 2018-07-10 (×5): 300 mg via ORAL
  Filled 2018-07-09 (×5): qty 1

## 2018-07-09 MED ORDER — LACTATED RINGERS IV SOLN
INTRAVENOUS | Status: DC
  Administered 2018-07-09 – 2018-07-10 (×3): via INTRAVENOUS

## 2018-07-09 MED ORDER — AMLODIPINE BESYLATE 5 MG PO TABS
5.0000 mg | ORAL_TABLET | Freq: Every day | ORAL | Status: DC
  Administered 2018-07-09 – 2018-07-10 (×2): 5 mg via ORAL
  Filled 2018-07-09 (×2): qty 1

## 2018-07-09 MED ORDER — HYDROMORPHONE HCL 1 MG/ML IJ SOLN
1.0000 mg | INTRAMUSCULAR | Status: DC | PRN
  Administered 2018-07-09: 1 mg via INTRAVENOUS
  Filled 2018-07-09: qty 1

## 2018-07-09 MED ORDER — ONDANSETRON 4 MG PO TBDP: 4.0000 mg | ORAL_TABLET | Freq: Four times a day (QID) | ORAL | Status: DC | PRN

## 2018-07-09 MED ORDER — LOSARTAN POTASSIUM 50 MG PO TABS
100.0000 mg | ORAL_TABLET | Freq: Every day | ORAL | Status: DC
  Administered 2018-07-09 – 2018-07-10 (×3): 100 mg via ORAL
  Filled 2018-07-09 (×3): qty 2

## 2018-07-09 MED ORDER — METHOCARBAMOL 500 MG PO TABS: 500.0000 mg | ORAL_TABLET | Freq: Four times a day (QID) | ORAL | Status: DC | PRN

## 2018-07-09 MED ORDER — METOPROLOL SUCCINATE ER 50 MG PO TB24
50.0000 mg | ORAL_TABLET | Freq: Every evening | ORAL | Status: DC
  Administered 2018-07-09 – 2018-07-10 (×3): 50 mg via ORAL
  Filled 2018-07-09 (×3): qty 1

## 2018-07-09 MED ORDER — SIMVASTATIN 20 MG PO TABS
20.0000 mg | ORAL_TABLET | Freq: Every day | ORAL | Status: DC
  Administered 2018-07-09 – 2018-07-10 (×3): 20 mg via ORAL
  Filled 2018-07-09 (×3): qty 1

## 2018-07-09 MED ORDER — FAMOTIDINE IN NACL 20-0.9 MG/50ML-% IV SOLN
20.0000 mg | Freq: Two times a day (BID) | INTRAVENOUS | Status: DC
  Administered 2018-07-09 – 2018-07-10 (×4): 20 mg via INTRAVENOUS
  Filled 2018-07-09 (×4): qty 50

## 2018-07-09 NOTE — Progress Notes (Signed)
1 Day Post-Op  Subjective: Alert and stable.  Says his pain is much better.  Denies nausea. Has ambulated Wants Foley out Says he is hungry  Heart rate 68.  BP 103/63.  SPO2 99% on room air.  WBC down 12.7.  Hemoglobin 14.4.  Objective: Vital signs in last 24 hours: Temp:  [97.9 F (36.6 C)-98.8 F (37.1 C)] 98.7 F (37.1 C) (08/20 0545) Pulse Rate:  [68-88] 68 (08/20 0545) Resp:  [15-22] 17 (08/20 0545) BP: (103-167)/(63-88) 103/63 (08/20 0545) SpO2:  [95 %-99 %] 99 % (08/20 0545) Weight:  [103.6 kg] 103.6 kg (08/20 0057) Last BM Date: 07/08/18  Intake/Output from previous day: 08/19 0701 - 08/20 0700 In: 2210 [P.O.:60; I.V.:2100; IV Piggyback:50] Out: 1150 [Urine:1100; Blood:50] Intake/Output this shift: No intake/output data recorded.  General appearance: Alert.  Oriented.  Mental status normal.  No distress. Resp: clear to auscultation bilaterally GI: Soft.  Incision clean.  Does not seem distended.  Hypoactive bowel sounds  Lab Results:  Results for orders placed or performed during the hospital encounter of 07/08/18 (from the past 24 hour(s))  CBC with Differential     Status: Abnormal   Collection Time: 07/08/18  2:57 PM  Result Value Ref Range   WBC 14.0 (H) 4.0 - 10.5 K/uL   RBC 4.76 4.22 - 5.81 MIL/uL   Hemoglobin 15.3 13.0 - 17.0 g/dL   HCT 44.6 39.0 - 52.0 %   MCV 93.7 78.0 - 100.0 fL   MCH 32.1 26.0 - 34.0 pg   MCHC 34.3 30.0 - 36.0 g/dL   RDW 12.2 11.5 - 15.5 %   Platelets 292 150 - 400 K/uL   Neutrophils Relative % 68 %   Neutro Abs 9.6 (H) 1.7 - 7.7 K/uL   Lymphocytes Relative 21 %   Lymphs Abs 3.0 0.7 - 4.0 K/uL   Monocytes Relative 6 %   Monocytes Absolute 0.9 0.1 - 1.0 K/uL   Eosinophils Relative 3 %   Eosinophils Absolute 0.4 0.0 - 0.7 K/uL   Basophils Relative 1 %   Basophils Absolute 0.1 0.0 - 0.1 K/uL   Immature Granulocytes 1 %   Abs Immature Granulocytes 0.1 0.0 - 0.1 K/uL  Comprehensive metabolic panel     Status: Abnormal   Collection Time: 07/08/18  2:57 PM  Result Value Ref Range   Sodium 139 135 - 145 mmol/L   Potassium 3.9 3.5 - 5.1 mmol/L   Chloride 105 98 - 111 mmol/L   CO2 24 22 - 32 mmol/L   Glucose, Bld 140 (H) 70 - 99 mg/dL   BUN 14 8 - 23 mg/dL   Creatinine, Ser 1.02 0.61 - 1.24 mg/dL   Calcium 9.8 8.9 - 10.3 mg/dL   Total Protein 7.1 6.5 - 8.1 g/dL   Albumin 4.3 3.5 - 5.0 g/dL   AST 25 15 - 41 U/L   ALT 19 0 - 44 U/L   Alkaline Phosphatase 86 38 - 126 U/L   Total Bilirubin 0.7 0.3 - 1.2 mg/dL   GFR calc non Af Amer >60 >60 mL/min   GFR calc Af Amer >60 >60 mL/min   Anion gap 10 5 - 15  Lipase, blood     Status: None   Collection Time: 07/08/18  2:57 PM  Result Value Ref Range   Lipase 43 11 - 51 U/L  Urinalysis, Routine w reflex microscopic     Status: None   Collection Time: 07/08/18  3:24 PM  Result Value Ref  Range   Color, Urine YELLOW YELLOW   APPearance CLEAR CLEAR   Specific Gravity, Urine 1.026 1.005 - 1.030   pH 5.0 5.0 - 8.0   Glucose, UA NEGATIVE NEGATIVE mg/dL   Hgb urine dipstick NEGATIVE NEGATIVE   Bilirubin Urine NEGATIVE NEGATIVE   Ketones, ur NEGATIVE NEGATIVE mg/dL   Protein, ur NEGATIVE NEGATIVE mg/dL   Nitrite NEGATIVE NEGATIVE   Leukocytes, UA NEGATIVE NEGATIVE  HIV antibody (Routine Testing)     Status: None   Collection Time: 07/08/18  6:00 PM  Result Value Ref Range   HIV Screen 4th Generation wRfx Non Reactive Non Reactive  Comprehensive metabolic panel     Status: Abnormal   Collection Time: 07/08/18  6:00 PM  Result Value Ref Range   Sodium 138 135 - 145 mmol/L   Potassium 4.6 3.5 - 5.1 mmol/L   Chloride 104 98 - 111 mmol/L   CO2 23 22 - 32 mmol/L   Glucose, Bld 106 (H) 70 - 99 mg/dL   BUN 14 8 - 23 mg/dL   Creatinine, Ser 1.00 0.61 - 1.24 mg/dL   Calcium 9.3 8.9 - 10.3 mg/dL   Total Protein 6.3 (L) 6.5 - 8.1 g/dL   Albumin 4.0 3.5 - 5.0 g/dL   AST 32 15 - 41 U/L   ALT 18 0 - 44 U/L   Alkaline Phosphatase 80 38 - 126 U/L   Total Bilirubin 1.4  (H) 0.3 - 1.2 mg/dL   GFR calc non Af Amer >60 >60 mL/min   GFR calc Af Amer >60 >60 mL/min   Anion gap 11 5 - 15  CBC     Status: Abnormal   Collection Time: 07/09/18  1:39 AM  Result Value Ref Range   WBC 12.7 (H) 4.0 - 10.5 K/uL   RBC 4.48 4.22 - 5.81 MIL/uL   Hemoglobin 14.4 13.0 - 17.0 g/dL   HCT 41.4 39.0 - 52.0 %   MCV 92.4 78.0 - 100.0 fL   MCH 32.1 26.0 - 34.0 pg   MCHC 34.8 30.0 - 36.0 g/dL   RDW 12.6 11.5 - 15.5 %   Platelets 235 150 - 400 K/uL  Creatinine, serum     Status: None   Collection Time: 07/09/18  1:39 AM  Result Value Ref Range   Creatinine, Ser 0.91 0.61 - 1.24 mg/dL   GFR calc non Af Amer >60 >60 mL/min   GFR calc Af Amer >60 >60 mL/min     Studies/Results: Ct Abdomen Pelvis W Contrast  Result Date: 07/08/2018 CLINICAL DATA:  Epigastric pain history of recent hernia surgery EXAM: CT ABDOMEN AND PELVIS WITH CONTRAST TECHNIQUE: Multidetector CT imaging of the abdomen and pelvis was performed using the standard protocol following bolus administration of intravenous contrast. CONTRAST:  100 mL Isovue-300 intravenous COMPARISON:  CT 03/15/2018 FINDINGS: Lower chest: Lung bases demonstrate no acute consolidation or effusion. The heart size is within normal limits. Hepatobiliary: Calcified granuloma. 11 mm hyperenhancing lesion posterior right hepatic dome. No calcified gallstone or biliary dilatation Pancreas: Unremarkable. No pancreatic ductal dilatation or surrounding inflammatory changes. Spleen: Normal in size without focal abnormality. Adrenals/Urinary Tract: Adrenal glands are normal. No hydronephrosis. Cysts in the left kidney. Additional subcentimeter cortical hypodense lesions too small to further characterize. Bladder within normal limits. Stomach/Bowel: Stomach is nonenlarged. No colon wall thickening. Contrast opacifies mid to distal small bowel. Borderline enlarged small bowel in the right lower quadrant. There is herniation of distal small bowel through  right lower quadrant  fascial defect. This is noted to be in the region of the patient's prior spigelian hernia. The small bowel that has herniated is borderline enlarged and fluid-filled. Contrast is seen proximal to the hernia. The exiting small bowel loop is decompressed, consistent with transition point related to the hernia. There is a small amount of fluid in the hernia sac. There is mild edema within the right lower lateral abdominal wall. No pneumatosis is seen. The small bowel distal to the hernia is decompressed. Vascular/Lymphatic: Mild aortic atherosclerosis. No aneurysm. No significantly enlarged lymph nodes. Reproductive: Slightly enlarged prostate. Other: Negative for free air.  Edema in the right lower quadrant. Musculoskeletal: 14 mm anterolisthesis L5 on S1 with chronic bilateral pars defect at L5. Degenerative changes at L4-L5 and L5-S1. IMPRESSION: 1. Findings consistent with recurrent hernia at the site of the patient's prior right lower spigelian hernia. Herniated small bowel loop is slightly enlarged and fluid-filled and there is a small amount of fluid in the hernia sac, consistent with incarcerated hernia. Small bowel upstream to the entering bowel loop is borderline enlarged, suggesting some degree of obstruction. No pneumatosis or free air is seen. There is edema within the right lower quadrant anterior abdominal wall. 2. No change in 11 mm hyperenhancing lesion in the posterior right hepatic dome for which MRI evaluation was previously suggested. Electronically Signed   By: Donavan Foil M.D.   On: 07/08/2018 21:30    . amLODipine  5 mg Oral Daily  . enoxaparin (LOVENOX) injection  40 mg Subcutaneous Q24H  . gabapentin  300 mg Oral BID  . iopamidol      . losartan  100 mg Oral QHS  . metoprolol succinate  50 mg Oral QPM  . senna  1 tablet Oral BID  . simvastatin  20 mg Oral q1800     Assessment/Plan: s/p Procedure(s): LAPAROSCOPIC INTERNAL HERNIA WITH OPEN REPAIR RECURRENT  INCARCERATED SPIGELIAN HERNIA WITH MESH LAPAROSCOPIC ASSISTED RECURRENT INCARCERATED  SPIGELIAN HERNIA  REPAIR WITH MESH  POD #1.  Repair recurrent incarcerated spigelian hernia with mesh and release of SBO Stable.  Encouraging resolution of symptoms following surgical intervention. DC Foley Ambulate Begin liquid diet DVT prophylaxis with Lovenox  Hypertension.  Resume home meds. CAD.  Status post CABG.-No apparent cardiovascular problems    @PROBHOSP @  LOS: 0 days    [name] 07/09/2018  . .prob

## 2018-07-09 NOTE — Addendum Note (Signed)
Addendum  created 07/09/18 0013 by Josephine Igo, MD   Sign clinical note

## 2018-07-09 NOTE — Anesthesia Postprocedure Evaluation (Signed)
Anesthesia Post Note  Patient: Tyler Lawrence  Procedure(s) Performed: LAPAROSCOPIC INTERNAL HERNIA WITH OPEN REPAIR RECURRENT INCARCERATED SPIGELIAN HERNIA WITH MESH (Abdomen) LAPAROSCOPIC ASSISTED RECURRENT INCARCERATED  SPIGELIAN HERNIA  REPAIR WITH MESH (N/A Abdomen)     Patient location during evaluation: PACU Anesthesia Type: General Level of consciousness: awake and alert and oriented Pain management: pain level controlled Vital Signs Assessment: post-procedure vital signs reviewed and stable Respiratory status: spontaneous breathing, nonlabored ventilation and respiratory function stable Cardiovascular status: blood pressure returned to baseline and stable Postop Assessment: no apparent nausea or vomiting Anesthetic complications: no    Last Vitals:  Vitals:   07/08/18 2345 07/09/18 0000  BP: (!) 153/68 (!) 158/73  Pulse: 83 81  Resp: (!) 22 20  Temp:  36.6 C  SpO2: 96% 95%    Last Pain:  Vitals:   07/09/18 0000  TempSrc:   PainSc: 4                  Seila Liston A.

## 2018-07-10 LAB — CBC
HCT: 37.8 % — ABNORMAL LOW (ref 39.0–52.0)
Hemoglobin: 12.8 g/dL — ABNORMAL LOW (ref 13.0–17.0)
MCH: 31.8 pg (ref 26.0–34.0)
MCHC: 33.9 g/dL (ref 30.0–36.0)
MCV: 94 fL (ref 78.0–100.0)
PLATELETS: 213 10*3/uL (ref 150–400)
RBC: 4.02 MIL/uL — ABNORMAL LOW (ref 4.22–5.81)
RDW: 12.5 % (ref 11.5–15.5)
WBC: 12.8 10*3/uL — AB (ref 4.0–10.5)

## 2018-07-10 LAB — BASIC METABOLIC PANEL
ANION GAP: 8 (ref 5–15)
BUN: 16 mg/dL (ref 8–23)
CALCIUM: 8.7 mg/dL — AB (ref 8.9–10.3)
CO2: 28 mmol/L (ref 22–32)
CREATININE: 0.81 mg/dL (ref 0.61–1.24)
Chloride: 106 mmol/L (ref 98–111)
GFR calc Af Amer: >60 mL/min (ref >60)
Glucose, Bld: 188 mg/dL — ABNORMAL HIGH (ref 70–99)
Potassium: 3.7 mmol/L (ref 3.5–5.1)
SODIUM: 142 mmol/L (ref 135–145)

## 2018-07-10 MED ORDER — FAMOTIDINE 20 MG PO TABS
20.0000 mg | ORAL_TABLET | Freq: Two times a day (BID) | ORAL | Status: DC
  Administered 2018-07-10: 20 mg via ORAL
  Filled 2018-07-10: qty 1

## 2018-07-10 NOTE — Progress Notes (Signed)
2 Days Post-Op  Subjective: Alert and stable Says his pain is gone Only feels discomfort in incision when he sits up Passing flatus but no stool Tolerating diet Lab work pending  Objective: Vital signs in last 24 hours: Temp:  [97.1 F (36.2 C)-98.7 F (37.1 C)] 97.1 F (36.2 C) (08/20 2017) Pulse Rate:  [68-87] 87 (08/20 2017) Resp:  [16-17] 17 (08/20 2017) BP: (101-105)/(57-63) 105/62 (08/20 2017) SpO2:  [97 %-99 %] 98 % (08/20 2017) Last BM Date: 07/08/18  Intake/Output from previous day: 08/20 0701 - 08/21 0700 In: 2133.1 [P.O.:600; I.V.:1433.1; IV Piggyback:100] Out: 2625 [Urine:2625] Intake/Output this shift: Total I/O In: 1773.1 [P.O.:240; I.V.:1433.1; IV Piggyback:100] Out: 1825 [Urine:1825]    EXAM: General appearance: Alert.  Oriented.  Mental status normal.  No dist ress.  Good spirits.. Resp: clear to auscultation bilaterally GI: Soft.  Incision clean.  Does not seem distended.  active bowel sounds    Lab Results:  No results found for this or any previous visit (from the past 24 hour(s)).   Studies/Results: No results found.  Marland Kitchen amLODipine  5 mg Oral Daily  . enoxaparin (LOVENOX) injection  40 mg Subcutaneous Q24H  . gabapentin  300 mg Oral BID  . losartan  100 mg Oral QHS  . metoprolol succinate  50 mg Oral QPM  . senna  1 tablet Oral BID  . simvastatin  20 mg Oral q1800     Assessment/Plan: s/p Procedure(s): LAPAROSCOPIC INTERNAL HERNIA WITH OPEN REPAIR RECURRENT INCARCERATED SPIGELIAN HERNIA WITH MESH LAPAROSCOPIC ASSISTED RECURRENT INCARCERATED  SPIGELIAN HERNIA  REPAIR WITH MESH  POD #2.  Repair recurrent incarcerated spigelian hernia with mesh and release of SBO Stable.  Encouraging resolution of symptoms following surgical intervention. Soft diet senokot BID Check labs Home when has BM and bowel function normalizes-hopefully tomorrow DVT prophylaxis with Lovenox  Hypertension.  Resume home meds. CAD.  Status post CABG.-No  apparent cardiovascular problems    @PROBHOSP @  LOS: 1 day    [name] 07/10/2018  . .prob

## 2018-07-10 NOTE — Plan of Care (Signed)
Pt in bed; a&ox4. Right lower abd drsg in place with old drainage. Area marked. Lt abd drsg in place with no drainage. Abd binder in place. No concerns or complaints voiced at this time. Will continue to monitor.

## 2018-07-11 MED ORDER — TRAMADOL HCL 50 MG PO TABS: 50.0000 mg | ORAL_TABLET | Freq: Four times a day (QID) | ORAL | 0 refills | Status: AC | PRN

## 2018-07-11 NOTE — Progress Notes (Signed)
Pt was discharged home today. Instructions were reviewed with patient, and questions were answered. Pt was taken to main entrance via wheelchair by NT.  

## 2018-07-11 NOTE — Discharge Instructions (Signed)
[facility]      [facility] [phone]  OPEN ABDOMINAL SURGERY: POST OP INSTRUCTIONS  Always review your discharge instruction sheet given to you by the facility where your surgery was performed.  IF YOU HAVE DISABILITY OR FAMILY LEAVE FORMS, YOU MUST BRING THEM TO THE OFFICE FOR PROCESSING.  PLEASE DO NOT GIVE THEM TO YOUR DOCTOR.  1. A prescription for pain medication may be given to you upon discharge.  Take your pain medication as prescribed, if needed.  If narcotic pain medicine is not needed, then you may take acetaminophen (Tylenol) or ibuprofen (Advil) as needed. 2. Take your usually prescribed medications unless otherwise directed. 3. If you need a refill on your pain medication, please contact your pharmacy. They will contact our office to request authorization.  Prescriptions will not be filled after 5pm or on week-ends. 4. You should follow a light diet the first few days after arrival home, such as soup and crackers, pudding, etc.unless your doctor has advised otherwise. A high-fiber, low fat diet can be resumed as tolerated.   Be sure to include lots of fluids daily. Most patients will experience some swelling and bruising on the chest and neck area.  Ice packs will help.  Swelling and bruising can take several days to resolve 5. Most patients will experience some swelling and bruising in the area of the incision. Ice pack will help. Swelling and bruising can take several days to resolve..  6. It is common to experience some constipation if taking pain medication after surgery.  Increasing fluid intake and taking a stool softener will usually help or prevent this problem from occurring.  A mild laxative (Milk of Magnesia or Miralax) should be taken according to package directions if there are no bowel movements after 48 hours. 7.  You may have steri-strips (small skin tapes) in place directly over the incision.  These strips should be left on the skin for 7-10 days.  If your  surgeon used skin glue on the incision, you may shower in 24 hours.  The glue will flake off over the next 2-3 weeks.  Any sutures or staples will be removed at the office during your follow-up visit. You may find that a light gauze bandage over your incision may keep your staples from being rubbed or pulled. You may shower and replace the bandage daily. 8. ACTIVITIES:  You may resume regular (light) daily activities beginning the next day--such as daily self-care, walking, climbing stairs--gradually increasing activities as tolerated.  You may have sexual intercourse when it is comfortable.  Refrain from any heavy lifting or straining until approved by your doctor. a. You may drive when you no longer are taking prescription pain medication, you can comfortably wear a seatbelt, and you can safely maneuver your car and apply brakes b. Return to Work: ___________________________________ 9. You should see your doctor in the office for a follow-up appointment approximately two weeks after your surgery.  Make sure that you call for this appointment within a day or two after you arrive home to insure a convenient appointment time. OTHER INSTRUCTIONS:  _____________________________________________________________ _____________________________________________________________  WHEN TO CALL YOUR DOCTOR: 1. Fever over 101.0 2. Inability to urinate 3. Nausea and/or vomiting 4. Extreme swelling or bruising 5. Continued bleeding from incision. 6. Increased pain, redness, or drainage from the incision. 7. Difficulty swallowing or breathing 8. Muscle cramping or spasms. 9. Numbness or tingling in hands or feet or around lips.  The clinic staff is available to   answer your questions during regular business hours.  Please don't hesitate to call and ask to speak to one of the nurses if you have concerns.  For further questions, please visit [email or web address]   

## 2018-07-11 NOTE — Discharge Summary (Signed)
Patient ID: Tyler Lawrence [id number] 72 y.o. 1946/10/17  Admit date: 07/08/2018  Discharge date and time:   Admitting Physician: [name]  Discharge Physician: [name]  Admission Diagnoses: Right lower quadrant abdominal pain [R10.31] Abdominal pain [R10.9]  Discharge Diagnoses: Incarcerated incisional hernia with small bowel obstruction                                         History laparoscopic repair right spigelian hernia                                         History open right inguinal hernia repair, remote                                         History laparoscopic repair bilateral inguinal hernias with mesh, 2016                                         CAD.  Status post CABG.  Stable cardiac wise.                                            History of appendectomy                                           History of hemorrhoidectomy                                           Left bundle branch block    Operations: Procedure(s): LAPAROSCOPIC INTERNAL HERNIA WITH OPEN REPAIR RECURRENT INCARCERATED SPIGELIAN HERNIA WITH MESH LAPAROSCOPIC ASSISTED RECURRENT INCARCERATED  SPIGELIAN HERNIA  REPAIR WITH MESH  Admission Condition: fair  Discharged Condition: good  Indication for Admission: This is a 72 year old man who I have known for many years.  He had undergone an open right inguinal hernia repair many years ago in [place].  He underwent laparoscopic repair of bilateral inguinal hernias with mesh by Dr. gross about 2 years ago.  I took him to the operating room about 5 weeks ago for elective laparoscopic repair of a right spigelian hernia.  We placed a large piece of mesh but due to the scar tissue and old mesh from the inguinal hernias I could not overlap the inferior area as much as I would like.  He did well.  He did have a little bit of pain but today he had as significant increase in right lower quadrant pain.  He came to the emergency room.  White  count was elevated to 14,000.  There was a tender mass in the right lower quadrant.  CT scan confirmed that he had incarcerated small bowel.  He was brought to the operating room urgently  Hospital Course: Following admission and CT scanning, the  patient was taken to the operating room and underwent diagnostic laparoscopy and open repair of the incarcerated recurrent spigelian hernia.     Operative findings were: There was a loop of small bowel incarcerated in the recurrent hernia.  This had herniated under the inferior edge of the mesh.  It was completely incarcerated and edematous and there was a little bit of serosal hemorrhage the bowel was pink, had good peristalsis and had good tissue thickness and integrity and was not ischemic.  There was no evidence of any other gross intra-abdominal problem by laparoscopy.  There was a little bit of serosanguineous fluid in the pelvis but no inflammatory changes.  We put the bowel back in the abdomen.  After repairing the hernia we repeated the laparoscopy and it looked perfectly viable.  We were able to close the peritoneum and the internal oblique in 2 separate layers.  We placed ultra pro mesh in between the internal and external oblique and then closed the external oblique on top.  This seemed to be a very good technical repair      Postoperatively the patient did well.  His pain essentially resolved.  He resumed the ability to ambulate fairly promptly.  Foley catheter was removed on postop day 1 he had no trouble voiding.  We advanced his diet.  He had 3 bowel movements on July 10, 2018 and he was ready for discharge on July 11, 2018.  At the time of discharge he was pleasant.  In no pain.  Noticed a little bit of incisional tenderness when he sat up but said he felt much better.  His appetite was excellent.  Exam revealed he was afebrile without any tachycardia.  Abdomen was soft.  Nondistended.  Minimal incisional tenderness.  Normal bowel sounds.  All  wounds look good     He was told to continue his usual medications.  We called in a prescription for tramadol for pain.  Diet and activities were discussed.      He will return to the office on August 30 for staple removal       I will see him again about 3 weeks from now       No sports or lifting more than 20 pounds for 6 weeks  Consults: None  Significant Diagnostic Studies: Lab work and CT scanning of abdomen:   Treatments: surgery: Diagnostic laparoscopy, open repair of incarcerated recurrent incisional hernia.  Release of small bowel obstruction  Disposition: Home  Patient Instructions:  Allergies as of 07/11/2018   No Known Allergies     Medication List    TAKE these medications   acetaminophen 325 MG tablet Commonly known as:  TYLENOL Take 650 mg by mouth every 6 (six) hours as needed for mild pain.   amLODipine 5 MG tablet Commonly known as:  NORVASC Take 1 tablet (5 mg total) by mouth daily.   aspirin EC 81 MG tablet Take 81 mg by mouth at bedtime.   losartan 100 MG tablet Commonly known as:  COZAAR Take 100 mg by mouth at bedtime.   metoprolol succinate 50 MG 24 hr tablet Commonly known as:  TOPROL-XL TAKE 1 TABLET BY MOUTH EVERY DAY   multivitamin with minerals tablet Take 1 tablet by mouth daily.   simvastatin 20 MG tablet Commonly known as:  ZOCOR Take 1 tablet (20 mg total) by mouth daily at 6 PM. PLEASE CONTACT THE OFFICE FOR ADDITIONAL REFILLS   traMADol 50 MG tablet Commonly known as:  ULTRAM Take 1 tablet (50 mg total) by mouth every 6 (six) hours as needed for moderate pain or severe pain.            Discharge Care Instructions  (From admission, onward)         Start     Ordered   07/11/18 0000  Discharge wound care:    Comments:  Cover the incisions where the staples are if there is any drainage Otherwise this can be  left open to air You may take a quick shower every day No tub baths or swimming pools   07/11/18 0633           Activity: No driving for about 1 week.  No lifting for 6 weeks.  Ambulation encouraged Diet: low fat, low cholesterol diet Wound Care: as directed  Follow-up:  With Dr. Darrel Hoover staff in 8 days for staple removal  .  Signed: Edsel Petrin. Dalbert Batman, M.D., FACS General and minimally invasive surgery Breast and Colorectal Surgery  07/11/2018, 6:36 AM

## 2018-07-30 DIAGNOSIS — M1712 Unilateral primary osteoarthritis, left knee: Secondary | ICD-10-CM | POA: Diagnosis not present

## 2018-08-09 DIAGNOSIS — Z23 Encounter for immunization: Secondary | ICD-10-CM | POA: Diagnosis not present

## 2018-09-27 DIAGNOSIS — Z1389 Encounter for screening for other disorder: Secondary | ICD-10-CM | POA: Diagnosis not present

## 2018-09-27 DIAGNOSIS — Z6827 Body mass index (BMI) 27.0-27.9, adult: Secondary | ICD-10-CM | POA: Diagnosis not present

## 2018-09-27 DIAGNOSIS — E663 Overweight: Secondary | ICD-10-CM | POA: Diagnosis not present

## 2018-09-27 DIAGNOSIS — J069 Acute upper respiratory infection, unspecified: Secondary | ICD-10-CM | POA: Diagnosis not present

## 2018-11-05 DIAGNOSIS — L03114 Cellulitis of left upper limb: Secondary | ICD-10-CM | POA: Diagnosis not present

## 2018-11-05 DIAGNOSIS — Z6826 Body mass index (BMI) 26.0-26.9, adult: Secondary | ICD-10-CM | POA: Diagnosis not present

## 2018-11-05 DIAGNOSIS — E663 Overweight: Secondary | ICD-10-CM | POA: Diagnosis not present

## 2018-11-26 DIAGNOSIS — M1991 Primary osteoarthritis, unspecified site: Secondary | ICD-10-CM | POA: Diagnosis not present

## 2018-11-26 DIAGNOSIS — Z0000 Encounter for general adult medical examination without abnormal findings: Secondary | ICD-10-CM | POA: Diagnosis not present

## 2018-11-26 DIAGNOSIS — Z1389 Encounter for screening for other disorder: Secondary | ICD-10-CM | POA: Diagnosis not present

## 2018-11-26 DIAGNOSIS — E782 Mixed hyperlipidemia: Secondary | ICD-10-CM | POA: Diagnosis not present

## 2018-11-26 DIAGNOSIS — M47816 Spondylosis without myelopathy or radiculopathy, lumbar region: Secondary | ICD-10-CM | POA: Diagnosis not present

## 2018-11-26 DIAGNOSIS — E663 Overweight: Secondary | ICD-10-CM | POA: Diagnosis not present

## 2018-11-26 DIAGNOSIS — I700 Atherosclerosis of aorta: Secondary | ICD-10-CM | POA: Diagnosis not present

## 2018-11-26 DIAGNOSIS — Z6826 Body mass index (BMI) 26.0-26.9, adult: Secondary | ICD-10-CM | POA: Diagnosis not present

## 2018-11-26 DIAGNOSIS — I10 Essential (primary) hypertension: Secondary | ICD-10-CM | POA: Diagnosis not present

## 2018-12-26 DIAGNOSIS — R7309 Other abnormal glucose: Secondary | ICD-10-CM | POA: Diagnosis not present

## 2018-12-26 DIAGNOSIS — E663 Overweight: Secondary | ICD-10-CM | POA: Diagnosis not present

## 2018-12-26 DIAGNOSIS — Z6826 Body mass index (BMI) 26.0-26.9, adult: Secondary | ICD-10-CM | POA: Diagnosis not present

## 2018-12-26 DIAGNOSIS — R5383 Other fatigue: Secondary | ICD-10-CM | POA: Diagnosis not present

## 2018-12-26 DIAGNOSIS — M159 Polyosteoarthritis, unspecified: Secondary | ICD-10-CM | POA: Diagnosis not present

## 2019-02-19 ENCOUNTER — Telehealth: Payer: Self-pay

## 2019-02-19 ENCOUNTER — Telehealth: Payer: Self-pay | Admitting: Cardiovascular Disease

## 2019-02-19 NOTE — Telephone Encounter (Signed)
   Primary Cardiologist:  Shelva Majestic, MD   Patient contacted.  History reviewed.  No symptoms to suggest any unstable cardiac conditions.  Based on discussion, with current pandemic situation, we will be postponing this appointment for Tyler Lawrence with a plan for f/u in 12 wks or sooner if feasible/necessary.  If symptoms change, he has been instructed to contact our office.   Routing to C19 CANCEL pool for tracking (P CV DIV CV19 CANCEL - reason for visit "other.") and assigning priority (1 = 4-6 wks, 2 = 6-12 wks, 3 = >12 wks).   Therisa Doyne  02/19/2019 3:23 PM         .

## 2019-02-19 NOTE — Telephone Encounter (Signed)
Attempted to contact patient regarding switching appointment to telephone visit for Friday or rescheduling.   Patient did not answer, unable to leave voicemail, will try again.

## 2019-02-21 ENCOUNTER — Ambulatory Visit: Payer: Medicare Other | Admitting: Cardiovascular Disease

## 2019-02-25 ENCOUNTER — Telehealth: Payer: Self-pay | Admitting: Cardiovascular Disease

## 2019-02-26 NOTE — Telephone Encounter (Signed)
error 

## 2019-03-26 ENCOUNTER — Other Ambulatory Visit: Payer: Self-pay | Admitting: Cardiovascular Disease

## 2019-03-26 NOTE — Telephone Encounter (Signed)
Amlodipine 5 mg refilled. 

## 2019-03-28 DIAGNOSIS — Z6826 Body mass index (BMI) 26.0-26.9, adult: Secondary | ICD-10-CM | POA: Diagnosis not present

## 2019-03-28 DIAGNOSIS — M1991 Primary osteoarthritis, unspecified site: Secondary | ICD-10-CM | POA: Diagnosis not present

## 2019-03-28 DIAGNOSIS — J302 Other seasonal allergic rhinitis: Secondary | ICD-10-CM | POA: Diagnosis not present

## 2019-03-28 DIAGNOSIS — E663 Overweight: Secondary | ICD-10-CM | POA: Diagnosis not present

## 2019-05-19 ENCOUNTER — Other Ambulatory Visit: Payer: Self-pay

## 2019-05-19 ENCOUNTER — Ambulatory Visit (INDEPENDENT_AMBULATORY_CARE_PROVIDER_SITE_OTHER): Payer: BC Managed Care – PPO | Admitting: Cardiovascular Disease

## 2019-05-19 ENCOUNTER — Encounter: Payer: Self-pay | Admitting: Cardiovascular Disease

## 2019-05-19 VITALS — BP 118/60 | HR 66 | Temp 98.9°F | Ht 69.0 in | Wt 188.0 lb

## 2019-05-19 DIAGNOSIS — M25562 Pain in left knee: Secondary | ICD-10-CM | POA: Diagnosis not present

## 2019-05-19 DIAGNOSIS — Z951 Presence of aortocoronary bypass graft: Secondary | ICD-10-CM | POA: Diagnosis not present

## 2019-05-19 DIAGNOSIS — I2583 Coronary atherosclerosis due to lipid rich plaque: Secondary | ICD-10-CM

## 2019-05-19 DIAGNOSIS — I10 Essential (primary) hypertension: Secondary | ICD-10-CM | POA: Diagnosis not present

## 2019-05-19 DIAGNOSIS — G8929 Other chronic pain: Secondary | ICD-10-CM

## 2019-05-19 DIAGNOSIS — I447 Left bundle-branch block, unspecified: Secondary | ICD-10-CM | POA: Diagnosis not present

## 2019-05-19 DIAGNOSIS — E782 Mixed hyperlipidemia: Secondary | ICD-10-CM | POA: Diagnosis not present

## 2019-05-19 DIAGNOSIS — I2510 Atherosclerotic heart disease of native coronary artery without angina pectoris: Secondary | ICD-10-CM

## 2019-05-19 DIAGNOSIS — R600 Localized edema: Secondary | ICD-10-CM

## 2019-05-19 MED ORDER — HYDROCHLOROTHIAZIDE 12.5 MG PO CAPS: 12.5000 mg | ORAL_CAPSULE | ORAL | 3 refills | Status: DC | PRN
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
## 2019-05-19 NOTE — Patient Instructions (Signed)
Medication Instructions:  Start Hydrochlorothiazide 12.5 mg as needed for leg swelling.  If you need a refill on your cardiac medications before your next appointment, please call your pharmacy.   Testing/Procedures: Your physician has requested that you have an [place] (in December). A cardiac stress test is a cardiological test that measures the heart's ability to respond to external stress in a controlled clinical environment. For further information please visit [facility]. If you have questions or concerns about your appointment, you can call the Nuclear Lab at [phone].   Follow-Up: At [facility] you and your health needs are our priority.  As part of our continuing mission to provide you with exceptional heart care, we have created designated Provider Care Teams.  These Care Teams include your primary Cardiologist (physician) and Advanced Practice Providers (APPs -  Physician Assistants and Nurse Practitioners) who all work together to provide you with the care you need, when you need it. You will need a follow up appointment in 6 months.  Please call our office 2 months in advance to schedule this appointment.  You may see Shelva Majestic, MD or one of the following Advanced Practice Providers on your designated Care Team: [place], [place] . Fabian Sharp, PA-C

## 2019-05-22 DIAGNOSIS — M1712 Unilateral primary osteoarthritis, left knee: Secondary | ICD-10-CM | POA: Diagnosis not present

## 2019-05-22 DIAGNOSIS — I2510 Atherosclerotic heart disease of native coronary artery without angina pectoris: Secondary | ICD-10-CM | POA: Diagnosis not present

## 2019-05-22 DIAGNOSIS — Z951 Presence of aortocoronary bypass graft: Secondary | ICD-10-CM | POA: Diagnosis not present

## 2019-05-22 DIAGNOSIS — I10 Essential (primary) hypertension: Secondary | ICD-10-CM | POA: Diagnosis not present

## 2019-05-22 LAB — CBC
Hematocrit: 42.9 % (ref 37.5–51.0)
Hemoglobin: 14.8 g/dL (ref 13.0–17.7)
MCH: 32.4 pg (ref 26.6–33.0)
MCHC: 34.5 g/dL (ref 31.5–35.7)
MCV: 94 fL (ref 79–97)
Platelets: 222 10*3/uL (ref 150–450)
RBC: 4.57 x10E6/uL (ref 4.14–5.80)
RDW: 12.2 % (ref 11.6–15.4)
WBC: 9.1 10*3/uL (ref 3.4–10.8)

## 2019-05-22 LAB — LIPID PANEL
Chol/HDL Ratio: 4.4 ratio (ref 0.0–5.0)
Cholesterol, Total: 167 mg/dL (ref 100–199)
HDL: 38 mg/dL — ABNORMAL LOW (ref >39)
LDL Calculated: 96 mg/dL (ref 0–99)
Triglycerides: 164 mg/dL — ABNORMAL HIGH (ref 0–149)
VLDL Cholesterol Cal: 33 mg/dL (ref 5–40)

## 2019-05-22 LAB — COMPREHENSIVE METABOLIC PANEL
ALT: 23 IU/L (ref 0–44)
AST: 28 IU/L (ref 0–40)
Albumin/Globulin Ratio: 2.3 — ABNORMAL HIGH (ref 1.2–2.2)
Albumin: 4.6 g/dL (ref 3.7–4.7)
Alkaline Phosphatase: 76 IU/L (ref 39–117)
BUN/Creatinine Ratio: 22 (ref 10–24)
BUN: 18 mg/dL (ref 8–27)
Bilirubin Total: 0.5 mg/dL (ref 0.0–1.2)
CO2: 23 mmol/L (ref 20–29)
Calcium: 9.6 mg/dL (ref 8.6–10.2)
Chloride: 102 mmol/L (ref 96–106)
Creatinine, Ser: 0.81 mg/dL (ref 0.76–1.27)
GFR calc Af Amer: 103 mL/min/{1.73_m2} (ref >59)
GFR calc non Af Amer: 89 mL/min/{1.73_m2} (ref >59)
Globulin, Total: 2 g/dL (ref 1.5–4.5)
Glucose: 125 mg/dL — ABNORMAL HIGH (ref 65–99)
Potassium: 4.3 mmol/L (ref 3.5–5.2)
Sodium: 140 mmol/L (ref 134–144)
Total Protein: 6.6 g/dL (ref 6.0–8.5)

## 2019-05-22 LAB — TSH: TSH: 1.17 u[IU]/mL (ref 0.450–4.500)

## 2019-05-26 ENCOUNTER — Other Ambulatory Visit: Payer: Self-pay

## 2019-05-26 MED ORDER — ROSUVASTATIN CALCIUM 20 MG PO TABS: 20.0000 mg | ORAL_TABLET | Freq: Every day | ORAL | 3 refills | Status: DC

## 2019-05-26 NOTE — Progress Notes (Signed)
Notes recorded by Troy Sine, MD on 05/25/2019 at 9:21 PM EDT  Glucose improved but remains elevated at 125; triglycerides now increased and LDL had increased to 96. Consider changing simvastatin to rosuvastatin 20 mg if he can tolerate.

## 2019-06-04 DIAGNOSIS — J019 Acute sinusitis, unspecified: Secondary | ICD-10-CM | POA: Diagnosis not present

## 2019-06-18 ENCOUNTER — Other Ambulatory Visit: Payer: Self-pay | Admitting: Cardiovascular Disease

## 2019-07-02 DIAGNOSIS — R2242 Localized swelling, mass and lump, left lower limb: Secondary | ICD-10-CM | POA: Diagnosis not present

## 2019-07-02 DIAGNOSIS — M1712 Unilateral primary osteoarthritis, left knee: Secondary | ICD-10-CM | POA: Diagnosis not present

## 2019-07-04 DIAGNOSIS — Z23 Encounter for immunization: Secondary | ICD-10-CM | POA: Diagnosis not present

## 2019-07-11 ENCOUNTER — Other Ambulatory Visit: Payer: Self-pay

## 2019-07-11 DIAGNOSIS — R600 Localized edema: Secondary | ICD-10-CM

## 2019-07-14 ENCOUNTER — Ambulatory Visit (HOSPITAL_COMMUNITY): Payer: Medicare Other | Attending: Internal Medicine

## 2019-07-16 ENCOUNTER — Encounter: Payer: Self-pay | Admitting: Family

## 2019-08-13 ENCOUNTER — Other Ambulatory Visit: Payer: Self-pay | Admitting: Cardiovascular Disease

## 2019-08-15 ENCOUNTER — Other Ambulatory Visit: Payer: Self-pay

## 2019-08-15 DIAGNOSIS — R600 Localized edema: Secondary | ICD-10-CM
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
## 2019-08-18 ENCOUNTER — Ambulatory Visit (HOSPITAL_COMMUNITY)
Admission: RE | Admit: 2019-08-18 | Discharge: 2019-08-18 | Disposition: A | Discharge status: Home / Self Care | Payer: BC Managed Care – PPO | Source: Ambulatory Visit | Attending: Family | Admitting: Family

## 2019-08-18 ENCOUNTER — Ambulatory Visit (INDEPENDENT_AMBULATORY_CARE_PROVIDER_SITE_OTHER): Payer: BC Managed Care – PPO | Admitting: Physician Assistant

## 2019-08-18 ENCOUNTER — Other Ambulatory Visit: Payer: Self-pay

## 2019-08-18 VITALS — BP 119/75 | HR 65 | Temp 98.1°F | Resp 16 | Ht 69.0 in | Wt 184.0 lb

## 2019-08-18 DIAGNOSIS — R600 Localized edema: Secondary | ICD-10-CM

## 2019-08-18 DIAGNOSIS — I2583 Coronary atherosclerosis due to lipid rich plaque: Secondary | ICD-10-CM | POA: Diagnosis not present

## 2019-08-18 DIAGNOSIS — I2510 Atherosclerotic heart disease of native coronary artery without angina pectoris: Secondary | ICD-10-CM

## 2019-08-21 DIAGNOSIS — M179 Osteoarthritis of knee, unspecified: Secondary | ICD-10-CM | POA: Diagnosis not present

## 2019-08-21 DIAGNOSIS — Z01810 Encounter for preprocedural cardiovascular examination: Secondary | ICD-10-CM | POA: Diagnosis not present

## 2019-08-21 DIAGNOSIS — J302 Other seasonal allergic rhinitis: Secondary | ICD-10-CM | POA: Diagnosis not present

## 2019-08-21 DIAGNOSIS — I10 Essential (primary) hypertension: Secondary | ICD-10-CM | POA: Diagnosis not present

## 2019-08-21 DIAGNOSIS — Z1389 Encounter for screening for other disorder: Secondary | ICD-10-CM | POA: Diagnosis not present

## 2019-08-21 DIAGNOSIS — Z6828 Body mass index (BMI) 28.0-28.9, adult: Secondary | ICD-10-CM | POA: Diagnosis not present

## 2019-08-26 ENCOUNTER — Telehealth: Payer: Self-pay | Admitting: Cardiovascular Disease

## 2019-08-26 NOTE — Telephone Encounter (Signed)
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  Is an appointment needed? Please call

## 2019-08-26 NOTE — Telephone Encounter (Signed)
Called patient wife. Advised that he was just seen and recommendation was for 6 month follow up- not due until December. Patient does have Stress test scheduled in October 21st. Advised wife to wait for this to be completed, and then bring in paperwork- that way we can have an updated test to fill out paperwork. Patient wife verbalized understanding.

## 2019-09-09 ENCOUNTER — Telehealth (HOSPITAL_COMMUNITY): Payer: Self-pay

## 2019-09-09 NOTE — Telephone Encounter (Signed)
Encounter complete. 

## 2019-09-10 ENCOUNTER — Ambulatory Visit (HOSPITAL_COMMUNITY)
Admission: RE | Admit: 2019-09-10 | Discharge: 2019-09-10 | Disposition: A | Discharge status: Home / Self Care | Payer: BC Managed Care – PPO | Source: Ambulatory Visit | Attending: Internal Medicine | Admitting: Internal Medicine

## 2019-09-10 ENCOUNTER — Other Ambulatory Visit: Payer: Self-pay

## 2019-09-10 DIAGNOSIS — Z951 Presence of aortocoronary bypass graft: Secondary | ICD-10-CM | POA: Diagnosis not present

## 2019-09-10 LAB — MYOCARDIAL PERFUSION IMAGING
LV dias vol: 117 mL (ref 62–150)
LV sys vol: 55 mL
Peak HR: 95 {beats}/min
Rest HR: 51 {beats}/min
SDS: 3
SRS: 2
SSS: 5
TID: 1.12

## 2019-09-10 MED ORDER — TECHNETIUM TC 99M TETROFOSMIN IV KIT
32.6000 | PACK | Freq: Once | INTRAVENOUS | Status: AC | PRN
  Administered 2019-09-10: 32.6 via INTRAVENOUS
  Filled 2019-09-10: qty 33

## 2019-09-10 MED ORDER — REGADENOSON 0.4 MG/5ML IV SOLN
0.4000 mg | Freq: Once | INTRAVENOUS | Status: AC
  Administered 2019-09-10: 0.4 mg via INTRAVENOUS

## 2019-09-10 MED ORDER — TECHNETIUM TC 99M TETROFOSMIN IV KIT
10.7000 | PACK | Freq: Once | INTRAVENOUS | Status: AC | PRN
  Administered 2019-09-10: 10.7 via INTRAVENOUS
  Filled 2019-09-10: qty 11

## 2019-09-12 ENCOUNTER — Telehealth: Payer: Self-pay | Admitting: Cardiovascular Disease

## 2019-09-12 NOTE — Telephone Encounter (Signed)
Unable to reach pt or leave a message mailbox is full, will forward to dr Claiborne Billings and michele, lpn.

## 2019-09-12 NOTE — Telephone Encounter (Signed)
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 No answer/VM will try again Monday

## 2019-09-12 NOTE — Telephone Encounter (Signed)
Patient is calling in regards to his stress test. 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 Says he gave the office the fax number.

## 2019-09-12 NOTE — Telephone Encounter (Signed)
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 Claiborne Billings has reviews his results our office will be in contact and get him back the signed forms. Pt agreed no additional questions at this time.   10/23: pt called about his forms since test is resulted. Paperwork given to covering nurse for Dr. Claiborne Billings for him to complete.

## 2019-09-12 NOTE — Telephone Encounter (Signed)
Notes recorded by Troy Sine, MD on 09/11/2019 at 2:51 PM EDT  Low risk nuclear stress test with normal perfusion and function  PT NOTIFIED  Received paperwork will fill out today and call pt back

## 2019-09-15 NOTE — Progress Notes (Signed)
Virtual Visit via Telephone Note   This visit type was conducted due to national recommendations for restrictions regarding the COVID-19 Pandemic (e.g. social distancing) in an effort to limit this patient's exposure and mitigate transmission in our community.  Due to his co-morbid illnesses, this patient is at least at moderate risk for complications without adequate follow up.  This format is felt to be most appropriate for this patient at this time.  The patient did not have access to video technology/had technical difficulties with video requiring transitioning to audio format only (telephone).  All issues noted in this document were discussed and addressed.  No physical exam could be performed with this format.  Please refer to the patient's chart for his  consent to telehealth for [facility].   Date:  09/16/2019   ID:  XZAVIAN GAMBALE, DOB 1946/04/14, MRN [id number]  Patient Location: Home Provider Location: Home  PCP:  Redmond School, MD  Cardiologist:  Shelva Majestic, MD  Electrophysiologist:  None   Evaluation Performed:  Follow-Up Visit  Chief Complaint:  CAD, HTN  History of Present Illness:    Tyler Lawrence is a 73 y.o. male with a history of CAD s/p CABG x 3 (LIMA-LAD, SVG-Diag, SVG-Cx, 01/2000),  HTN, HLD,and LBBB. Last heart cath in 2005 revealed patent 3/3 grafts, a nondominant RCA with significant disease but too small for intervention. He underwent sternal wire removal by Dr. Roxy Manns 03/13/17 after a chest trauma while working in [place] to protruding sternal wire and eventually infection. He was last seen by Dr. Claiborne Billings 05/19/19 and was doing well at that time. 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 Nuclear stress test 09/10/19 revealed no reversible ischemia, but did note a LVEF of 45-54%. Echo on 01/03/18 in response to a nuc that estimated EF of 45-50% showed an EF of 55-60% and grade 1 DD with mildly dilated left atrium.  He presents today for  routine follow up. I was able to complete the visit via telephone. He was not able to do a video visit. He is overall doing very well. We discussed his BP and nuclear stress test. He has gained weight lately while straying from his diet. He will work on losing weight. 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   The patient does not have symptoms concerning for COVID-19 infection (fever, chills, cough, or new shortness of breath).    Past Medical History:  Diagnosis Date   Arthritis    HANDS HURT.  HX OF NERVE COMPRESSION IN LOWER BACK THAT CAUSES PAIN -AND LEFT LEG PAIN   Back pain    PT STATES HE HAS FLARE UPS OF LOWER BACK AND LEFT LEG PAIN -MAYBE A LITTLE NUMBNESS--PT STATES HE WAS TOLD HE HAS A PINCHED NERVE.   CAD (coronary artery disease)    DR. [place]; CABG 2001,HEART CATH 2005 -PATENT GRAFTS, "LUMINAL IRREGULARITY IN THE VEIN TO THE DIAGONAL VESSEL.  HIS RCA WHICH WAS NONDOMINANT, HAD 78% STENOSIS."  NUCLEAR STRESS TEST 04/04/12--"ESSENTIALLY UNCHANGED FROM PRIOR STUDY AND SHOWED A SMALL PERFUSION DEFECT IN THE BASAL ANTERIOR AND BASAL ANTEROSEPTAL REGION CONSISTENT WITH PROXIMAL LAD SCAR. "   Complication of anesthesia    PT STATES SURGERY AT [facility] 2012  SPINAL ATTEMPTED FOR HEMORRHOIDECTOMY AND HERNIA REPAIR.  PT STATES WHEN NEEDLE WAS PUT IN HIS BACK - HE FELT ELECTRICAL SHOCK DOWN LEFT GROIN AND LEFT LEG.  PT STATES  THAT HE WAS THEN PUT TO  SLEEP.   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 Gala Romney- grade 3 hemorrhoids, o/w normal ileocolonoscopy   CORONARY ARTERY BYPASS GRAFT  01/26/2000   ELBOW SURGERY     for a chipped bone   EYE SURGERY Bilateral    Cataracts   HEMORRHOID SURGERY  07/26/2012   Dr. Dalbert Batman   HEMORRHOID SURGERY N/A 06/18/2015   Procedure: INTERNAL AND EXTERNAL HEMORRHOIDECTOMY SINGLE CLOUMN;  Surgeon: Fanny Skates, MD;  Location: [facility];  Service: General;  Laterality: N/A;   HEMORROIDECTOMY     MANY YEARS AGO   HERNIA REPAIR Right 06/11/2018    LAPAROSCOPIC ASSISTED RIGHT SPIGELIAN HERNIA REPAIR (Right Abdomen)   INSERTION OF MESH Right 06/11/2018   Procedure: INSERTION OF MESH;  Surgeon: Fanny Skates, MD;  Location: [place];  Service: General;  Laterality: Right;  GENERAL AND TAP BLOCK   LAPAROSCOPIC ASSISTED [place] Right 06/11/2018   Procedure: LAPAROSCOPIC ASSISTED RIGHT SPIGELIAN HERNIA REPAIR;  Surgeon: Fanny Skates, MD;  Location: [place];  Service: General;  Laterality: Right;  GENERAL AND TAP BLOCK   LAPAROSCOPIC ASSISTED [place] N/A 07/08/2018   Procedure: LAPAROSCOPIC ASSISTED RECURRENT INCARCERATED  SPIGELIAN HERNIA  REPAIR WITH MESH;  Surgeon: Fanny Skates, MD;  [facility];  Service: General;  Laterality: N/A;   LAPAROSCOPIC INTERNAL HERNIA REPAIR  07/08/2018   Procedure: LAPAROSCOPIC INTERNAL HERNIA WITH OPEN REPAIR RECURRENT INCARCERATED SPIGELIAN HERNIA WITH MESH;  Surgeon: Fanny Skates, MD;  [facility];  Service: General;;   Rt hernia repair in March of 2012     STATES HE ALSO HAD [place] N/A 03/13/2017   Procedure: STERNAL [place];  Surgeon: Rexene Alberts, MD;  [facility];  Service: Thoracic;  Laterality: N/A;     Current Meds  Medication Sig    acetaminophen (TYLENOL) 325 MG tablet Take 650 mg by mouth every 6 (six) hours as needed for mild pain.   amLODipine (NORVASC) 5 MG tablet TAKE 1 TABLET BY MOUTH EVERY DAY   aspirin EC 81 MG tablet Take 81 mg by mouth at bedtime.    losartan (COZAAR) 100 MG tablet Take 100 mg by mouth at bedtime.    metoprolol succinate (TOPROL-XL) 50 MG 24 hr tablet TAKE 1 TABLET BY MOUTH EVERY DAY   Multiple Vitamins-Minerals (MULTIVITAMIN WITH MINERALS) tablet Take 1 tablet by mouth daily.   [DISCONTINUED] rosuvastatin (CRESTOR) 20 MG tablet TAKE 1 TABLET BY MOUTH EVERY DAY   [DISCONTINUED] rosuvastatin (CRESTOR) 40 MG tablet Take 0.5 tablets (20 mg total) by mouth daily.     Allergies:   Patient has no known allergies.   Social  History   Tobacco Use   Smoking status: Former Smoker    Packs/day: 2.00    Years: 10.00    Pack years: 20.00   Smokeless tobacco: Never Used   Tobacco comment: QUIT 1991  Substance Use Topics   Alcohol use: No   Drug use: No     Family Hx: The patient's family history includes Diabetes in his mother; Heart attack in his maternal grandmother and mother; Heart disease in his father and paternal grandfather; Heart failure in his father and mother. There is no history of Colon cancer.  ROS:   Please see the history of present illness.     All other systems reviewed and are negative.   Prior CV studies:   The following studies were reviewed today:  Nuclear stress test 09/10/19:  Nuclear stress EF: 53%.  The left ventricular ejection fraction is mildly decreased (45-54%).  There was no ST segment deviation noted during stress.  The study is normal.  This is a low risk study.   Low risk stress nuclear study with normal perfusion and low normal left ventricular global systolic function.    Echo 01/03/18: - Left ventricle: The cavity size was normal. Wall thickness was   normal. Systolic function was normal. The estimated ejection   fraction was  in the range of 55% to 60%. Doppler parameters are   consistent with abnormal left ventricular relaxation (grade 1   diastolic dysfunction). - Left atrium: The atrium was mildly dilated.  Labs/Other Tests and Data Reviewed:    EKG:  No ECG reviewed.  Recent Labs: 05/22/2019: ALT 23; BUN 18; Creatinine, Ser 0.81; Hemoglobin 14.8; Platelets 222; Potassium 4.3; Sodium 140; TSH 1.170   Recent Lipid Panel Lab Results  Component Value Date/Time   CHOL 167 05/22/2019 08:13 AM   TRIG 164 (H) 05/22/2019 08:13 AM   HDL 38 (L) 05/22/2019 08:13 AM   CHOLHDL 4.4 05/22/2019 08:13 AM   CHOLHDL 4.0 04/03/2017 07:17 AM   LDLCALC 96 05/22/2019 08:13 AM    Wt Readings from Last 3 Encounters:  09/16/19 176 lb (79.8 kg)  09/10/19 184 lb (83.5 kg)  08/18/19 184 lb (83.5 kg)     Objective:    Vital Signs:  Wt 176 lb (79.8 kg)    BMI 25.99 kg/m    VITAL SIGNS:  reviewed GEN:  no acute distress RESPIRATORY:  normal respiratory effort, symmetric expansion NEURO:  alert and oriented x 3, no obvious focal deficit PSYCH:  normal affect  ASSESSMENT & PLAN:     CAD s/p CABG x 3, 3/3 patent grafts in 2005 - continue ASA - he is active with his job and denies anginal symptoms - nuclear stress test was negative for reversible ischemia - continue BB, ARB, statin   Hypertension - he does not have a BP cuff this morning and has not been checking his BP - states that his BP was normal when he presented for his nuclear stress test - continue losartan, amlodipine, HCTZ, and toprol 50 mg daily   Hyperlipidemia 05/22/2019: Cholesterol, Total 167; HDL 38; LDL Calculated 96; Triglycerides 164 He is only on 20 mg crestor. He states he has gained weight and has been off his diet. I will increase crestor to 40 mg while he works on diet. Will need fasting lipids in 6 months with Dr. Claiborne Billings.    Lower extremity swelling - 12.5 mg HCTZ that Dr. Claiborne Billings started at last visit has resolved his LE swelling.  - he  thinks the heat was the major cause - he was working in [place] this past summer in 115 degree heat - will need BMP at next visit with Dr. 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 He does not have heart failure and does not use insulin. Nuclear stress test on 09/10/19 was negative for reversible ischemia. Nuclear stress test read his EF as mildly reduced, as it did in 2019. Echocardiogram in 2019 confirmed normal EF. He does not have signs or symptoms of CHF. He understands that given his CAD he is at a higher risk for cardiac complications, but is willing to proceed and does not need further cardiac evaluation. According to the RCRI, he has a 6.6% risk of a major cardiac complication in the perioperative period. He may proceed with surgery without additional testing. We typically recommend continuation of ASA throughout the perioperative period.    COVID-19 Education: The signs and symptoms of COVID-19 were discussed with the patient and how to seek care for testing (follow up with PCP or arrange E-visit).  The importance of social distancing was discussed today.  Time:   Today, I have spent 16 minutes with the patient with telehealth technology discussing the above problems.     Medication Adjustments/Labs and Tests Ordered: Current medicines are reviewed at length with the patient today.  Concerns regarding medicines are outlined above.   Tests Ordered: No orders of the defined types were placed in this encounter.   Medication Changes:  crestor 40 mg, take 1 tablet daily, dispense 90 tabs, 1 refill  Follow Up:  In Person in 6 month(s) - in person selected to get BP reading  Signed, Ledora Bottcher, [place]  09/16/2019 8:49 AM    [place]

## 2019-09-15 NOTE — Telephone Encounter (Signed)
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 Pt stated he will come by to get them tomorrow morning.   Also wanted to make sure he was aware huis visit tomorrow will be virtual. Pt verbalized yes and that he will do a telephone call visit.

## 2019-09-15 NOTE — Telephone Encounter (Signed)
Forms/testing notes are at the front desk for pt to pick up. Also faxed to requesting party

## 2019-09-16 ENCOUNTER — Other Ambulatory Visit: Payer: Self-pay | Admitting: Physician Assistant

## 2019-09-16 ENCOUNTER — Telehealth (INDEPENDENT_AMBULATORY_CARE_PROVIDER_SITE_OTHER): Payer: BC Managed Care – PPO | Admitting: Physician Assistant

## 2019-09-16 ENCOUNTER — Encounter: Payer: Self-pay | Admitting: Physician Assistant

## 2019-09-16 VITALS — Wt 176.0 lb

## 2019-09-16 DIAGNOSIS — Z01818 Encounter for other preprocedural examination: Secondary | ICD-10-CM

## 2019-09-16 DIAGNOSIS — R600 Localized edema: Secondary | ICD-10-CM | POA: Diagnosis not present

## 2019-09-16 DIAGNOSIS — I2583 Coronary atherosclerosis due to lipid rich plaque: Secondary | ICD-10-CM | POA: Diagnosis not present

## 2019-09-16 DIAGNOSIS — I10 Essential (primary) hypertension: Secondary | ICD-10-CM

## 2019-09-16 DIAGNOSIS — I2510 Atherosclerotic heart disease of native coronary artery without angina pectoris: Secondary | ICD-10-CM | POA: Diagnosis not present

## 2019-09-16 DIAGNOSIS — E782 Mixed hyperlipidemia: Secondary | ICD-10-CM | POA: Diagnosis not present

## 2019-09-16 DIAGNOSIS — Z951 Presence of aortocoronary bypass graft: Secondary | ICD-10-CM | POA: Diagnosis not present

## 2019-09-16 MED ORDER — ROSUVASTATIN CALCIUM 40 MG PO TABS: 20.0000 mg | ORAL_TABLET | Freq: Every day | ORAL | 1 refills | Status: DC

## 2019-09-16 MED ORDER — ROSUVASTATIN CALCIUM 40 MG PO TABS: 40.0000 mg | ORAL_TABLET | Freq: Every day | ORAL | 1 refills | Status: DC

## 2019-09-16 NOTE — Patient Instructions (Signed)
Medication Instructions:  INCREASE crestor to 40mg  daily   Follow-Up: At [facility], you and your health needs are our priority.  As part of our continuing mission to provide you with exceptional heart care, we have created designated Provider Care Teams.  These Care Teams include your primary Cardiologist (physician) and Advanced Practice Providers (APPs -  Physician Assistants and Nurse Practitioners) who all work together to provide you with the care you need, when you need it.  Your next appointment:   6 months  The format for your next appointment:   In Person  Provider:   Shelva Majestic, MD  Other Instructions

## 2019-10-14 NOTE — H&P (Signed)
Patient's anticipated LOS is less than 2 midnights, meeting these requirements: - Younger than 54 - Lives within 1 hour of care - Has a competent adult at home to recover with post-op recover - NO history of  - Chronic pain requiring opiods  - Diabetes  - Coronary Artery Disease  - Heart failure  - Heart attack  - Stroke  - DVT/VTE  - Cardiac arrhythmia  - Respiratory Failure/COPD  - Renal failure  - Anemia  - Advanced Liver disease       Tyler Lawrence is an 73 y.o. male.    Chief Complaint: left knee pain  HPI: Pt is a 73 y.o. male complaining of left knee pain for multiple years. Pain had continually increased since the beginning. X-rays in the clinic show end-stage arthritic changes of the left knee. Pt has tried various conservative treatments which have failed to alleviate their symptoms, including injections and therapy. Various options are discussed with the patient. Risks, benefits and expectations were discussed with the patient. Patient understand the risks, benefits and expectations and wishes to proceed with surgery.   PCP:  Redmond School, MD  D/C Plans: Home  PMH: Past Medical History:  Diagnosis Date  . Arthritis    HANDS HURT.  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 Back pain    PT STATES HE HAS FLARE UPS OF LOWER BACK AND LEFT LEG PAIN -MAYBE A LITTLE NUMBNESS--PT STATES HE WAS TOLD HE HAS A PINCHED NERVE.  Marland Kitchen CAD (coronary artery disease)    DR. [place]; CABG 2001,HEART CATH 2005 -PATENT GRAFTS, "LUMINAL IRREGULARITY IN THE VEIN TO THE DIAGONAL VESSEL.  HIS RCA WHICH WAS NONDOMINANT, HAD 78% STENOSIS."  NUCLEAR STRESS TEST 04/04/12--"ESSENTIALLY UNCHANGED FROM PRIOR STUDY AND SHOWED A SMALL PERFUSION DEFECT IN THE BASAL ANTERIOR AND BASAL ANTEROSEPTAL REGION CONSISTENT WITH PROXIMAL LAD SCAR. "  . Complication of anesthesia    PT STATES SURGERY [facility] 2012  SPINAL ATTEMPTED FOR  HEMORRHOIDECTOMY AND HERNIA REPAIR.  PT STATES WHEN NEEDLE WAS PUT IN HIS BACK - HE FELT ELECTRICAL SHOCK DOWN LEFT GROIN AND LEFT LEG.  PT STATES THAT HE WAS THEN PUT TO  SLEEP.  . Fever blister 07/25/12   ON UPPER LIP  . GERD (gastroesophageal reflux disease)   . Heart palpitations    occasional   . Hemorrhoid    ARE ITCHING AND UNCOMFORTABLE  . Hypertension   . Incarcerated incisional hernia 07/08/2018  . Left bundle branch block    CHRONIC  . Protruding sternal wires   . 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 Weakness of left arm    slight left arm weakness      PSH: Past Surgical History:  Procedure Laterality Date  . APPENDECTOMY    . CARDIAC CATHETERIZATION  02/2004  . COLONOSCOPY     remote past, can't remember  . COLONOSCOPY N/A 04/20/2014   Dr. Gala Romney- grade 3 hemorrhoids, o/w normal ileocolonoscopy  . CORONARY ARTERY BYPASS GRAFT  01/26/2000  . ELBOW SURGERY     for a chipped bone  . EYE SURGERY Bilateral    Cataracts  . HEMORRHOID SURGERY  07/26/2012   Dr. Dalbert Batman  . HEMORRHOID SURGERY N/A 06/18/2015   Procedure: INTERNAL AND EXTERNAL HEMORRHOIDECTOMY SINGLE CLOUMN;  Surgeon: Fanny Skates, MD;  [facility];  Service:  General;  Laterality: N/A;  . HEMORROIDECTOMY     MANY YEARS AGO  . HERNIA REPAIR Right 06/11/2018    LAPAROSCOPIC ASSISTED RIGHT SPIGELIAN HERNIA REPAIR (Right Abdomen)  . INSERTION OF MESH Right 06/11/2018   Procedure: INSERTION OF MESH;  Surgeon: Fanny Skates, MD;  Location: [place];  Service: General;  Laterality: Right;  GENERAL AND TAP BLOCK  . LAPAROSCOPIC ASSISTED SPIGELIAN HERNIA REPAIR Right 06/11/2018   Procedure: LAPAROSCOPIC ASSISTED RIGHT SPIGELIAN HERNIA REPAIR;  Surgeon: Fanny Skates, MD;  Location: [place];  Service: General;  Laterality: Right;  GENERAL AND TAP BLOCK  . LAPAROSCOPIC ASSISTED SPIGELIAN HERNIA REPAIR N/A 07/08/2018   Procedure: LAPAROSCOPIC ASSISTED RECURRENT  INCARCERATED  SPIGELIAN HERNIA  REPAIR WITH MESH;  Surgeon: Fanny Skates, MD;  [facility];  Service: General;  Laterality: N/A;  . LAPAROSCOPIC INTERNAL HERNIA REPAIR  07/08/2018   Procedure: LAPAROSCOPIC INTERNAL HERNIA WITH OPEN REPAIR RECURRENT INCARCERATED SPIGELIAN HERNIA WITH MESH;  Surgeon: Fanny Skates, MD;  [facility];  Service: General;;  . Rt hernia repair in March of 2012     STATES HE ALSO HAD HEMORRHODECTOMY AT [place]  . STERNAL WIRES REMOVAL N/A 03/13/2017   Procedure: STERNAL WIRES REMOVAL;  Surgeon: Rexene Alberts, MD;  Location: [place];  Service: Thoracic;  Laterality: N/A;    Social History:  reports that he has quit smoking. He has a 20.00 pack-year smoking history. He has never used smokeless tobacco. He reports that he does not drink alcohol or use drugs.  Allergies:  No Known Allergies  Medications: No current facility-administered medications for this encounter.    Current Outpatient Medications  Medication Sig Dispense Refill  . acetaminophen (TYLENOL) 325 MG tablet Take 650 mg by mouth every 6 (six) hours as needed for mild pain.    Marland Kitchen amLODipine (NORVASC) 5 MG tablet TAKE 1 TABLET BY MOUTH EVERY DAY 90 tablet 3  . aspirin EC 81 MG tablet Take 81 mg by mouth at bedtime.     . hydrochlorothiazide (MICROZIDE) 12.5 MG capsule Take 1 capsule (12.5 mg total) by mouth as needed (for leg swelling). 90 capsule 3  . losartan (COZAAR) 100 MG tablet Take 100 mg by mouth at bedtime.   0  . metoprolol succinate (TOPROL-XL) 50 MG 24 hr tablet TAKE 1 TABLET BY MOUTH EVERY DAY 90 tablet 2  . Multiple Vitamins-Minerals (MULTIVITAMIN WITH MINERALS) tablet Take 1 tablet by mouth daily.    . rosuvastatin (CRESTOR) 40 MG tablet Take 1 tablet (40 mg total) by mouth daily. 90 tablet 1    No results found for this or any previous visit (from the past 48 hour(s)). No results found.  ROS: Pain with rom of the left lower extremity  Physical Exam: Alert and  oriented 73 y.o. male in no acute distress Cranial nerves 2-12 intact Cervical spine: full rom with no tenderness, nv intact distally Chest: active breath sounds bilaterally, no wheeze rhonchi or rales Heart: regular rate and rhythm, no murmur Abd: non tender non distended with active bowel sounds Hip is stable with rom  Left knee pain to medial and lateral joint line nv intact distally Antalgic gait   Assessment/Plan Assessment: left knee end stage osteoarthritis  Plan:  Patient will undergo a left total knee by Dr. Veverly Fells at [facility]. Risks benefits and expectations were discussed with the patient. Patient understand risks, benefits and expectations and wishes to proceed. Preoperative templating of the joint replacement has been completed, documented, and  submitted to the Operating Room personnel in order to optimize intra-operative equipment management.   Merla Riches PA-C, MPAS [facility] is now [employer] [address], [place], [place], [place] [address] Phone: [phone] [email or web address]  [employer]

## 2019-10-21 ENCOUNTER — Other Ambulatory Visit (HOSPITAL_COMMUNITY)
Admission: RE | Admit: 2019-10-21 | Discharge: 2019-10-21 | Disposition: A | Discharge status: Home / Self Care | Payer: BC Managed Care – PPO | Source: Ambulatory Visit | Attending: Orthopedic Surgery | Admitting: Orthopedic Surgery

## 2019-10-21 DIAGNOSIS — Z20828 Contact with and (suspected) exposure to other viral communicable diseases: Secondary | ICD-10-CM | POA: Diagnosis not present

## 2019-10-21 DIAGNOSIS — Z01812 Encounter for preprocedural laboratory examination: Secondary | ICD-10-CM | POA: Diagnosis not present

## 2019-10-21 NOTE — Patient Instructions (Addendum)
DUE TO COVID-19 ONLY ONE VISITOR IS ALLOWED TO COME WITH YOU AND STAY IN THE WAITING ROOM ONLY DURING PRE OP AND PROCEDURE DAY OF SURGERY. THE 1 VISITOR MAY VISIT WITH YOU AFTER SURGERY IN YOUR PRIVATE ROOM DURING VISITING HOURS ONLY!   ONCE YOUR COVID TEST IS COMPLETED, PLEASE BEGIN THE QUARANTINE INSTRUCTIONS AS OUTLINED IN YOUR HANDOUT.                Tyler Lawrence    Your procedure is scheduled on: 10/24/19   Report to [facility] Main  Entrance   Report to Short Stay at 5:30 AM     Call this number if you have problems the morning of surgery [phone]   . BRUSH YOUR TEETH MORNING OF SURGERY AND RINSE YOUR MOUTH OUT, NO CHEWING GUM CANDY OR MINTS.   Do not eat food After Midnight.   YOU MAY HAVE CLEAR LIQUIDS FROM MIDNIGHT UNTIL 4:30 AM.   At 4:30 AM Please finish the prescribed Pre-Surgery  drink  . Nothing by mouth after you finish the  drink !    Take these medicines the morning of surgery with A SIP OF WATER: None                                 You may not have any metal on your body including              piercings  Do not wear jewelry,  lotions, powders or  deodorant                        Men may shave face and neck.   Do not bring valuables to the hospital. [place].  Contacts, dentures or bridgework may not be worn into surgery.       Patients discharged the day of surgery will not be allowed to drive home.  IF YOU ARE HAVING SURGERY AND GOING HOME THE SAME DAY, YOU MUST HAVE AN ADULT TO DRIVE YOU HOME AND BE WITH YOU FOR 24 HOURS.  YOU MAY GO HOME BY TAXI OR UBER OR ORTHERWISE, BUT AN ADULT MUST ACCOMPANY YOU HOME AND STAY WITH YOU FOR 24 HOURS.  Name and phone number of your driver:  Special Instructions: N/A              Please read over the following fact sheets you were given: _____________________________________________________________________             [facility] - Preparing  for Surgery  Before surgery, you can play an important role.   Because skin is not sterile, your skin needs to be as free of germs as possible.   You can reduce the number of germs on your skin by washing with CHG (chlorahexidine gluconate) soap before surgery .  CHG is an antiseptic cleaner which kills germs and bonds with the skin to continue killing germs even after washing. Please DO NOT use if you have an allergy to CHG or antibacterial soaps.   If your skin becomes reddened/irritated stop using the CHG and inform your nurse when you arrive at Short Stay. .  You may shave your face/neck.  Please follow these instructions carefully:  1.  Shower with CHG Soap the night before surgery and the  morning of Surgery.  2.  If you choose to wash your hair, wash your hair first as usual with your  normal  shampoo.  3.  After you shampoo, rinse your hair and body thoroughly to remove the  shampoo.                                        4.  Use CHG as you would any other liquid soap.  You can apply chg directly  to the skin and wash                       Gently with a scrungie or clean washcloth.  5.  Apply the CHG Soap to your body ONLY FROM THE NECK DOWN.   Do not use on face/ open                           Wound or open sores. Avoid contact with eyes, ears mouth and genitals (private parts).                       Wash face,  Genitals (private parts) with your normal soap.             6.  Wash thoroughly, paying special attention to the area where your surgery  will be performed.  7.  Thoroughly rinse your body with warm water from the neck down.  8.  DO NOT shower/wash with your normal soap after using and rinsing off  the CHG Soap.             9.  Pat yourself dry with a clean towel.            10.  Wear clean pajamas.            11.  Place clean sheets on your bed the night of your first shower and do not  sleep with pets. Day of Surgery : Do not apply any lotions/deodorants the morning of  surgery.  Please wear clean clothes to the hospital/surgery center.  FAILURE TO FOLLOW THESE INSTRUCTIONS MAY RESULT IN THE CANCELLATION OF YOUR SURGERY PATIENT SIGNATURE_________________________________  NURSE SIGNATURE__________________________________  ________________________________________________________________________   Tyler Lawrence  An incentive spirometer is a tool that can help keep your lungs clear and active. This tool measures how well you are filling your lungs with each breath. Taking long deep breaths may help reverse or decrease the chance of developing breathing (pulmonary) problems (especially infection) following:  A long period of time when you are unable to move or be active. BEFORE THE PROCEDURE   If the spirometer includes an indicator to show your best effort, your nurse or respiratory therapist will set it to a desired goal.  If possible, sit up straight or lean slightly forward. Try not to slouch.  Hold the incentive spirometer in an upright position. INSTRUCTIONS FOR USE  1. Sit on the edge of your bed if possible, or sit up as far as you can in bed or on a chair. 2. Hold the incentive spirometer in an upright position. 3. Breathe out normally. 4. Place the mouthpiece in your mouth and seal your lips tightly around it. 5. Breathe in slowly and as deeply as possible, raising the piston or the ball toward the top of the column. 6. Hold your  breath for 3-5 seconds or for as long as possible. Allow the piston or ball to fall to the bottom of the column. 7. Remove the mouthpiece from your mouth and breathe out normally. 8. Rest for a few seconds and repeat Steps 1 through 7 at least 10 times every 1-2 hours when you are awake. Take your time and take a few normal breaths between deep breaths. 9. The spirometer may include an indicator to show your best effort. Use the indicator as a goal to work toward during each repetition. 10. After each set of 10  deep breaths, practice coughing to be sure your lungs are clear. If you have an incision (the cut made at the time of surgery), support your incision when coughing by placing a pillow or rolled up towels firmly against it. Once you are able to get out of bed, walk around indoors and cough well. You may stop using the incentive spirometer when instructed by your caregiver.  RISKS AND COMPLICATIONS  Take your time so you do not get dizzy or light-headed.  If you are in pain, you may need to take or ask for pain medication before doing incentive spirometry. It is harder to take a deep breath if you are having pain. AFTER USE  Rest and breathe slowly and easily.  It can be helpful to keep track of a log of your progress. Your caregiver can provide you with a simple table to help with this. If you are using the spirometer at home, follow these instructions: [place] IF:   You are having difficultly using the spirometer.  You have trouble using the spirometer as often as instructed.  Your pain medication is not giving enough relief while using the spirometer.  You develop fever of 100.5 F (38.1 C) or higher. SEEK IMMEDIATE MEDICAL CARE IF:   You cough up bloody sputum that had not been present before.  You develop fever of 102 F (38.9 C) or greater.  You develop worsening pain at or near the incision site. MAKE SURE YOU:   Understand these instructions.  Will watch your condition.  Will get help right away if you are not doing well or get worse. Document Released: 03/19/2007 Document Revised: 01/29/2012 Document Reviewed: 05/20/2007 [facility] Patient Information 2014 [place], [place].   ________________________________________________________________________

## 2019-10-22 ENCOUNTER — Other Ambulatory Visit: Payer: Self-pay

## 2019-10-22 ENCOUNTER — Encounter (HOSPITAL_COMMUNITY): Payer: Self-pay

## 2019-10-22 ENCOUNTER — Encounter (HOSPITAL_COMMUNITY)
Admission: RE | Admit: 2019-10-22 | Discharge: 2019-10-22 | Disposition: A | Discharge status: Home / Self Care | Payer: BC Managed Care – PPO | Source: Ambulatory Visit | Attending: Orthopedic Surgery | Admitting: Orthopedic Surgery

## 2019-10-22 DIAGNOSIS — Z01812 Encounter for preprocedural laboratory examination: Secondary | ICD-10-CM | POA: Insufficient documentation

## 2019-10-22 LAB — SURGICAL PCR SCREEN
MRSA, PCR: NEGATIVE
Staphylococcus aureus: POSITIVE — AB

## 2019-10-22 LAB — BASIC METABOLIC PANEL
Anion gap: 8 (ref 5–15)
BUN: 18 mg/dL (ref 8–23)
CO2: 26 mmol/L (ref 22–32)
Calcium: 9.5 mg/dL (ref 8.9–10.3)
Chloride: 105 mmol/L (ref 98–111)
Creatinine, Ser: 0.76 mg/dL (ref 0.61–1.24)
GFR calc Af Amer: >60 mL/min (ref >60)
GFR calc non Af Amer: >60 mL/min (ref >60)
Glucose, Bld: 111 mg/dL — ABNORMAL HIGH (ref 70–99)
Potassium: 4.3 mmol/L (ref 3.5–5.1)
Sodium: 139 mmol/L (ref 135–145)

## 2019-10-22 LAB — CBC
HCT: 45.3 % (ref 39.0–52.0)
Hemoglobin: 15.4 g/dL (ref 13.0–17.0)
MCH: 31.8 pg (ref 26.0–34.0)
MCHC: 34 g/dL (ref 30.0–36.0)
MCV: 93.4 fL (ref 80.0–100.0)
Platelets: 218 10*3/uL (ref 150–400)
RBC: 4.85 MIL/uL (ref 4.22–5.81)
RDW: 12.1 % (ref 11.5–15.5)
WBC: 10 10*3/uL (ref 4.0–10.5)
nRBC: 0 % (ref 0.0–0.2)

## 2019-10-22 NOTE — Progress Notes (Signed)
PCP - Dr. Selena Batten Cardiologist - Dr. Corky Downs  Chest x-ray - 03/09/17 EKG - 05/19/19 Stress Test - 09/10/19 ECHO - 01/03/18 Cardiac Cath -   Sleep Study - no CPAP -   Fasting Blood Sugar - NA Checks Blood Sugar _____ times a day  Blood Thinner Instructions:ASA Aspirin Instructions:5 day prior to DOS. Dr. Gilberto Better office Last Dose:10/18/19  Anesthesia review:   Patient denies shortness of breath, fever, cough and chest pain at PAT appointment yes  Patient verbalized understanding of instructions that were given to them at the PAT appointment. Patient was also instructed that they will need to review over the PAT instructions again at home before surgery. yes

## 2019-10-23 LAB — NOVEL CORONAVIRUS, NAA (HOSP ORDER, SEND-OUT TO REF LAB; TAT 18-24 HRS): SARS-CoV-2, NAA: NOT DETECTED

## 2019-10-23 NOTE — Anesthesia Preprocedure Evaluation (Addendum)
Anesthesia Evaluation  Patient identified by MRN, date of birth, ID band Patient awake    Reviewed: Allergy & Precautions, NPO status , Patient's Chart, lab work & pertinent test results  Airway Mallampati: I  TM Distance: >3 FB Neck ROM: Full    Dental   Pulmonary former smoker,    Pulmonary exam normal        Cardiovascular hypertension, Pt. on medications + CAD  Normal cardiovascular exam     Neuro/Psych    GI/Hepatic GERD  Medicated and Controlled,  Endo/Other    Renal/GU      Musculoskeletal   Abdominal   Peds  Hematology   Anesthesia Other Findings   Reproductive/Obstetrics                            Anesthesia Physical Anesthesia Plan  ASA: III  Anesthesia Plan: Spinal   Post-op Pain Management:  Regional for Post-op pain   Induction: Intravenous  PONV Risk Score and Plan: 1  Airway Management Planned: Nasal Cannula  Additional Equipment:   Intra-op Plan:   Post-operative Plan:   Informed Consent: I have reviewed the patients History and Physical, chart, labs and discussed the procedure including the risks, benefits and alternatives for the proposed anesthesia with the patient or authorized representative who has indicated his/her understanding and acceptance.       Plan Discussed with: CRNA and Surgeon  Anesthesia Plan Comments: (See PAT note 10/22/2019, Konrad Felix, PA-C)       Anesthesia Quick Evaluation

## 2019-10-23 NOTE — Progress Notes (Signed)
Anesthesia Chart Review   Case: Tyler Lawrence Date/Time: 10/24/19 0715   Procedure: TOTAL KNEE ARTHROPLASTY (Left Knee)   Anesthesia type: Spinal   Pre-op diagnosis: Left knee osteoarthritis   [facility] ROOM 06 / [facility]   Surgeon: Netta Cedars, MD      DISCUSSION:73 y.o. former smoker (20 pack years) with h/o GERD, HTN, CAD (CABG x3 2001), LBBB, left knee OA scheduled for above procedure 10/24/2019 with Dr. Netta Cedars.   Pt last seen by cardiology 09/16/2019 via telemedicine.  Seen by Fabian Sharp, PA-C.  Per OV note, "Preoperative evaluation for knee surgery.  He denies anginal symptoms while completing more than 4.0 METS with his current job. He does not have heart failure and does not use insulin. Nuclear stress test on 09/10/19 was negative for reversible ischemia. Nuclear stress test read his EF as mildly reduced, as it did in 2019. Echocardiogram in 2019 confirmed normal EF. He does not have signs or symptoms of CHF. He understands that given his CAD he is at a higher risk for cardiac complications, but is willing to proceed and does not need further cardiac evaluation. According to the RCRI, he has a 6.6% risk of a major cardiac complication in the perioperative period. He may proceed with surgery without additional testing. We typically recommend continuation of ASA throughout the perioperative period."  Anticipate pt can proceed with planned procedure barring acute status change.    VS: BP (!) 147/69   Pulse 61   Temp 36.7 C (Oral)   Resp 16   Ht 5\' 9"  (1.753 m)   Wt 83 kg   SpO2 99%   BMI 27.02 kg/m   PROVIDERS: Redmond School, MD is PCP   Shelva Majestic, MD is Cardiologist  LABS: Labs reviewed: Acceptable for surgery. (all labs ordered are listed, but only abnormal results are displayed)  Labs Reviewed  SURGICAL PCR SCREEN - Abnormal; Notable for the following components:      Result Value   Staphylococcus aureus POSITIVE (*)    All other components within normal  limits  BASIC METABOLIC PANEL - Abnormal; Notable for the following components:   Glucose, Bld 111 (*)    All other components within normal limits  CBC     IMAGES:   EKG: 05/19/2019 Rate 66 bpm Sinus rhythm with marked sinus arrhythmia Left bundle branch block   CV: Myocardial Perfusion Imaging 09/10/2019   Nuclear stress EF: 53%.  The left ventricular ejection fraction is mildly decreased (45-54%).  There was no ST segment deviation noted during stress.  The study is normal.  This is a low risk study.   Low risk stress nuclear study with normal perfusion and low normal left ventricular global systolic function.  Echo 01/03/2018 Study Conclusions  - Left ventricle: The cavity size was normal. Wall thickness was   normal. Systolic function was normal. The estimated ejection   fraction was in the range of 55% to 60%. Doppler parameters are   consistent with abnormal left ventricular relaxation (grade 1   diastolic dysfunction). - Left atrium: The atrium was mildly dilated. Past Medical History:  Diagnosis Date  . Arthritis    HANDS HURT.  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 Back pain    PT STATES HE HAS FLARE UPS OF LOWER BACK AND LEFT LEG PAIN -MAYBE A LITTLE NUMBNESS--PT STATES HE WAS TOLD HE HAS A PINCHED NERVE.  Marland Kitchen CAD (coronary artery disease)  DR. Shelva Majestic -CARDIOLOGIST; CABG 2001,HEART CATH 2005 -PATENT GRAFTS, "LUMINAL IRREGULARITY IN THE VEIN TO THE DIAGONAL VESSEL.  HIS RCA WHICH WAS NONDOMINANT, HAD 78% STENOSIS."  NUCLEAR STRESS TEST 04/04/12--"ESSENTIALLY UNCHANGED FROM PRIOR STUDY AND SHOWED A SMALL PERFUSION DEFECT IN THE BASAL ANTERIOR AND BASAL ANTEROSEPTAL REGION CONSISTENT WITH PROXIMAL LAD SCAR. "  . Complication of anesthesia    PT STATES SURGERY AT [facility]  SPINAL ATTEMPTED FOR HEMORRHOIDECTOMY AND HERNIA REPAIR.  PT STATES WHEN NEEDLE WAS PUT IN HIS BACK - HE FELT ELECTRICAL SHOCK DOWN  LEFT GROIN AND LEFT LEG.  PT STATES THAT HE WAS THEN PUT TO  SLEEP.  . Fever blister 07/25/12   ON UPPER LIP  . GERD (gastroesophageal reflux disease)   . Heart palpitations    occasional   . Hemorrhoid    ARE ITCHING AND UNCOMFORTABLE  . Hypertension   . Incarcerated incisional hernia 07/08/2018  . Left bundle branch block    CHRONIC  . Protruding sternal wires   . 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 Weakness of left arm    slight left arm weakness      Past Surgical History:  Procedure Laterality Date  . APPENDECTOMY    . CARDIAC CATHETERIZATION  02/2004  . COLONOSCOPY     remote past, can't remember  . COLONOSCOPY N/A 04/20/2014   Dr. Gala Romney- grade 3 hemorrhoids, o/w normal ileocolonoscopy  . CORONARY ARTERY BYPASS GRAFT  01/26/2000  . ELBOW SURGERY     for a chipped bone  . EYE SURGERY Bilateral    Cataracts  . HEMORRHOID SURGERY  07/26/2012   Dr. Dalbert Batman  . HEMORRHOID SURGERY N/A 06/18/2015   Procedure: INTERNAL AND EXTERNAL HEMORRHOIDECTOMY SINGLE CLOUMN;  Surgeon: Fanny Skates, MD;  [facility];  Service: General;  Laterality: N/A;  . HEMORROIDECTOMY     MANY YEARS AGO  . HERNIA REPAIR Right 06/11/2018    LAPAROSCOPIC ASSISTED RIGHT SPIGELIAN HERNIA REPAIR (Right Abdomen)  . INSERTION OF MESH Right 06/11/2018   Procedure: INSERTION OF MESH;  Surgeon: Fanny Skates, MD;  Location: [place];  Service: General;  Laterality: Right;  GENERAL AND TAP BLOCK  . LAPAROSCOPIC ASSISTED SPIGELIAN HERNIA REPAIR Right 06/11/2018   Procedure: LAPAROSCOPIC ASSISTED RIGHT SPIGELIAN HERNIA REPAIR;  Surgeon: Fanny Skates, MD;  Location: [place];  Service: General;  Laterality: Right;  GENERAL AND TAP BLOCK  . LAPAROSCOPIC ASSISTED SPIGELIAN HERNIA REPAIR N/A 07/08/2018   Procedure: LAPAROSCOPIC ASSISTED RECURRENT INCARCERATED  SPIGELIAN HERNIA  REPAIR WITH MESH;  Surgeon: Fanny Skates, MD;  [facility];  Service: General;   Laterality: N/A;  . LAPAROSCOPIC INTERNAL HERNIA REPAIR  07/08/2018   Procedure: LAPAROSCOPIC INTERNAL HERNIA WITH OPEN REPAIR RECURRENT INCARCERATED SPIGELIAN HERNIA WITH MESH;  Surgeon: Fanny Skates, MD;  [facility];  Service: General;;  . Rt hernia repair in March of 2012     STATES HE ALSO HAD HEMORRHODECTOMY AT [place]  . STERNAL WIRES REMOVAL N/A 03/13/2017   Procedure: STERNAL WIRES REMOVAL;  Surgeon: Rexene Alberts, MD;  Location[facility];  Service: Thoracic;  Laterality: N/A;    MEDICATIONS: . acetaminophen (TYLENOL) 650 MG CR tablet  . amLODipine (NORVASC) 5 MG tablet  . Ascorbic Acid (VITAMIN C PO)  . aspirin EC 81 MG tablet  . CINNAMON PO  . hydrochlorothiazide (MICROZIDE) 12.5 MG capsule  . levocetirizine (XYZAL)  5 MG tablet  . losartan (COZAAR) 100 MG tablet  . metoprolol succinate (TOPROL-XL) 50 MG 24 hr tablet  . Multiple Vitamins-Minerals (MULTIVITAMIN WITH MINERALS) tablet  . naproxen (NAPROSYN) 375 MG tablet  . polyethylene glycol (MIRALAX / GLYCOLAX) 17 g packet  . rosuvastatin (CRESTOR) 40 MG tablet  . VITAMIN D PO   No current facility-administered medications for this encounter.     Maia Plan [facility] Pre-Surgical Testing [phone] 10/23/19  10:59 AM

## 2019-10-24 ENCOUNTER — Inpatient Hospital Stay (HOSPITAL_COMMUNITY): Payer: BC Managed Care – PPO | Admitting: Anesthesiology

## 2019-10-24 ENCOUNTER — Inpatient Hospital Stay (HOSPITAL_COMMUNITY): Payer: BC Managed Care – PPO | Admitting: Physician Assistant

## 2019-10-24 ENCOUNTER — Inpatient Hospital Stay (HOSPITAL_COMMUNITY)
Admission: RE | Admit: 2019-10-24 | Discharge: 2019-10-25 | DRG: 470 | Disposition: A | Discharge status: Home Health | Payer: BC Managed Care – PPO | Attending: Orthopedic Surgery | Admitting: Orthopedic Surgery

## 2019-10-24 ENCOUNTER — Other Ambulatory Visit: Payer: Self-pay

## 2019-10-24 ENCOUNTER — Encounter (HOSPITAL_COMMUNITY): Payer: Self-pay

## 2019-10-24 ENCOUNTER — Encounter (HOSPITAL_COMMUNITY)
Admission: RE | Disposition: A | Discharge status: Home Health | Payer: Self-pay | Source: Home / Self Care | Attending: Orthopedic Surgery

## 2019-10-24 DIAGNOSIS — I447 Left bundle-branch block, unspecified: Secondary | ICD-10-CM | POA: Diagnosis not present

## 2019-10-24 DIAGNOSIS — I10 Essential (primary) hypertension: Secondary | ICD-10-CM | POA: Diagnosis present

## 2019-10-24 DIAGNOSIS — Z951 Presence of aortocoronary bypass graft: Secondary | ICD-10-CM

## 2019-10-24 DIAGNOSIS — M1712 Unilateral primary osteoarthritis, left knee: Principal | ICD-10-CM | POA: Diagnosis present

## 2019-10-24 DIAGNOSIS — Z79899 Other long term (current) drug therapy: Secondary | ICD-10-CM

## 2019-10-24 DIAGNOSIS — M25562 Pain in left knee: Secondary | ICD-10-CM | POA: Diagnosis not present

## 2019-10-24 DIAGNOSIS — I2510 Atherosclerotic heart disease of native coronary artery without angina pectoris: Secondary | ICD-10-CM | POA: Diagnosis not present

## 2019-10-24 DIAGNOSIS — Z96652 Presence of left artificial knee joint: Secondary | ICD-10-CM

## 2019-10-24 DIAGNOSIS — E782 Mixed hyperlipidemia: Secondary | ICD-10-CM | POA: Diagnosis not present

## 2019-10-24 DIAGNOSIS — K219 Gastro-esophageal reflux disease without esophagitis: Secondary | ICD-10-CM | POA: Diagnosis present

## 2019-10-24 DIAGNOSIS — Z87891 Personal history of nicotine dependence: Secondary | ICD-10-CM

## 2019-10-24 DIAGNOSIS — Z7982 Long term (current) use of aspirin: Secondary | ICD-10-CM | POA: Diagnosis not present

## 2019-10-24 HISTORY — PX: TOTAL KNEE ARTHROPLASTY: SHX125

## 2019-10-24 SURGERY — ARTHROPLASTY, KNEE, TOTAL
Anesthesia: Spinal | Site: Knee | Laterality: Left

## 2019-10-24 MED ORDER — POLYETHYLENE GLYCOL 3350 17 G PO PACK: 17.0000 g | PACK | Freq: Every day | ORAL | Status: DC | PRN

## 2019-10-24 MED ORDER — SODIUM CHLORIDE 0.9 % IR SOLN
Status: DC | PRN
  Administered 2019-10-24 (×2): 1000 mL

## 2019-10-24 MED ORDER — LORATADINE 10 MG PO TABS: 10.0000 mg | ORAL_TABLET | Freq: Every day | ORAL | Status: DC | PRN

## 2019-10-24 MED ORDER — PHENOL 1.4 % MT LIQD: 1.0000 | OROMUCOSAL | Status: DC | PRN

## 2019-10-24 MED ORDER — PROPOFOL 10 MG/ML IV BOLUS
INTRAVENOUS | Status: DC | PRN
  Administered 2019-10-24: 20 mg via INTRAVENOUS
  Administered 2019-10-24: 30 mg via INTRAVENOUS

## 2019-10-24 MED ORDER — LIDOCAINE 2% (20 MG/ML) 5 ML SYRINGE
INTRAMUSCULAR | Status: AC
  Filled 2019-10-24: qty 15

## 2019-10-24 MED ORDER — ROSUVASTATIN CALCIUM 20 MG PO TABS
40.0000 mg | ORAL_TABLET | Freq: Every day | ORAL | Status: DC
  Administered 2019-10-24: 40 mg via ORAL
  Administered 2019-10-25: 20 mg via ORAL
  Filled 2019-10-24 (×2): qty 2

## 2019-10-24 MED ORDER — ONDANSETRON HCL 4 MG/2ML IJ SOLN
INTRAMUSCULAR | Status: DC | PRN
  Administered 2019-10-24: 4 mg via INTRAVENOUS

## 2019-10-24 MED ORDER — METHOCARBAMOL 500 MG PO TABS: 500.0000 mg | ORAL_TABLET | Freq: Four times a day (QID) | ORAL | 1 refills | Status: DC | PRN

## 2019-10-24 MED ORDER — ACETAMINOPHEN 325 MG PO TABS
325.0000 mg | ORAL_TABLET | Freq: Four times a day (QID) | ORAL | Status: DC | PRN
  Administered 2019-10-25: 650 mg via ORAL
  Filled 2019-10-24: qty 2

## 2019-10-24 MED ORDER — METOCLOPRAMIDE HCL 5 MG PO TABS: 5.0000 mg | ORAL_TABLET | Freq: Three times a day (TID) | ORAL | Status: DC | PRN

## 2019-10-24 MED ORDER — FENTANYL CITRATE (PF) 100 MCG/2ML IJ SOLN
INTRAMUSCULAR | Status: DC | PRN
  Administered 2019-10-24: 25 ug via INTRAVENOUS
  Administered 2019-10-24: 75 ug via INTRAVENOUS

## 2019-10-24 MED ORDER — METHOCARBAMOL 500 MG PO TABS
500.0000 mg | ORAL_TABLET | Freq: Four times a day (QID) | ORAL | Status: DC | PRN
  Administered 2019-10-24 – 2019-10-25 (×3): 500 mg via ORAL
  Filled 2019-10-24 (×4): qty 1

## 2019-10-24 MED ORDER — POVIDONE-IODINE 10 % EX SWAB
2.0000 "application " | Freq: Once | CUTANEOUS | Status: AC
  Administered 2019-10-24: 2 via TOPICAL

## 2019-10-24 MED ORDER — PHENYLEPHRINE HCL-NACL 10-0.9 MG/250ML-% IV SOLN
INTRAVENOUS | Status: DC | PRN
  Administered 2019-10-24: 15 ug/min via INTRAVENOUS

## 2019-10-24 MED ORDER — METOCLOPRAMIDE HCL 5 MG/ML IJ SOLN: 5.0000 mg | Freq: Three times a day (TID) | INTRAMUSCULAR | Status: DC | PRN

## 2019-10-24 MED ORDER — VITAMIN D 25 MCG (1000 UNIT) PO TABS
1000.0000 [IU] | ORAL_TABLET | Freq: Every day | ORAL | Status: DC
  Administered 2019-10-24 – 2019-10-25 (×2): 1000 [IU] via ORAL
  Filled 2019-10-24 (×2): qty 1

## 2019-10-24 MED ORDER — NAPROXEN 375 MG PO TABS
375.0000 mg | ORAL_TABLET | Freq: Two times a day (BID) | ORAL | Status: DC
  Administered 2019-10-24 – 2019-10-25 (×2): 375 mg via ORAL
  Filled 2019-10-24 (×3): qty 1

## 2019-10-24 MED ORDER — HYDROMORPHONE HCL 1 MG/ML IJ SOLN
0.5000 mg | INTRAMUSCULAR | Status: DC | PRN
  Administered 2019-10-24: 1 mg via INTRAVENOUS
  Filled 2019-10-24: qty 1

## 2019-10-24 MED ORDER — LEVOCETIRIZINE DIHYDROCHLORIDE 5 MG PO TABS: 5.0000 mg | ORAL_TABLET | Freq: Every day | ORAL | Status: DC | PRN

## 2019-10-24 MED ORDER — CHLORHEXIDINE GLUCONATE 4 % EX LIQD: 60.0000 mL | Freq: Once | CUTANEOUS | Status: DC

## 2019-10-24 MED ORDER — LOSARTAN POTASSIUM 50 MG PO TABS
100.0000 mg | ORAL_TABLET | Freq: Every day | ORAL | Status: DC
  Administered 2019-10-24: 100 mg via ORAL
  Filled 2019-10-24: qty 2

## 2019-10-24 MED ORDER — PROPOFOL 500 MG/50ML IV EMUL
INTRAVENOUS | Status: AC
  Filled 2019-10-24: qty 50

## 2019-10-24 MED ORDER — DOCUSATE SODIUM 100 MG PO CAPS
100.0000 mg | ORAL_CAPSULE | Freq: Two times a day (BID) | ORAL | Status: DC
  Administered 2019-10-24 – 2019-10-25 (×2): 100 mg via ORAL
  Filled 2019-10-24 (×2): qty 1

## 2019-10-24 MED ORDER — DEXAMETHASONE SODIUM PHOSPHATE 10 MG/ML IJ SOLN
INTRAMUSCULAR | Status: DC | PRN
  Administered 2019-10-24: 10 mg via INTRAVENOUS

## 2019-10-24 MED ORDER — TRANEXAMIC ACID-NACL 1000-0.7 MG/100ML-% IV SOLN
1000.0000 mg | INTRAVENOUS | Status: AC
  Administered 2019-10-24: 08:00:00 1000 mg via INTRAVENOUS
  Filled 2019-10-24: qty 100

## 2019-10-24 MED ORDER — POLYETHYLENE GLYCOL 3350 17 G PO PACK: 17.0000 g | PACK | Freq: Every day | ORAL | Status: DC

## 2019-10-24 MED ORDER — PROPOFOL 10 MG/ML IV BOLUS
INTRAVENOUS | Status: AC
  Filled 2019-10-24: qty 40

## 2019-10-24 MED ORDER — ONDANSETRON HCL 4 MG/2ML IJ SOLN: 4.0000 mg | Freq: Once | INTRAMUSCULAR | Status: DC | PRN

## 2019-10-24 MED ORDER — SODIUM CHLORIDE 0.9 % IV SOLN
INTRAVENOUS | Status: DC
  Administered 2019-10-24: 18:00:00 via INTRAVENOUS

## 2019-10-24 MED ORDER — DEXAMETHASONE SODIUM PHOSPHATE 10 MG/ML IJ SOLN
INTRAMUSCULAR | Status: AC
  Filled 2019-10-24: qty 3

## 2019-10-24 MED ORDER — ONDANSETRON HCL 4 MG/2ML IJ SOLN
INTRAMUSCULAR | Status: AC
  Filled 2019-10-24: qty 6

## 2019-10-24 MED ORDER — LACTATED RINGERS IV SOLN
INTRAVENOUS | Status: DC
  Administered 2019-10-24 (×2): via INTRAVENOUS

## 2019-10-24 MED ORDER — OXYCODONE HCL 5 MG PO TABS
5.0000 mg | ORAL_TABLET | ORAL | Status: DC | PRN
  Administered 2019-10-24 (×2): 10 mg via ORAL
  Administered 2019-10-25: 5 mg via ORAL
  Administered 2019-10-25: 10 mg via ORAL
  Administered 2019-10-25 (×3): 5 mg via ORAL
  Filled 2019-10-24 (×2): qty 1
  Filled 2019-10-24: qty 2
  Filled 2019-10-24: qty 1
  Filled 2019-10-24 (×3): qty 2
  Filled 2019-10-24: qty 1

## 2019-10-24 MED ORDER — MIDAZOLAM HCL 5 MG/5ML IJ SOLN
INTRAMUSCULAR | Status: DC | PRN
  Administered 2019-10-24: 2 mg via INTRAVENOUS

## 2019-10-24 MED ORDER — TRANEXAMIC ACID-NACL 1000-0.7 MG/100ML-% IV SOLN
1000.0000 mg | Freq: Once | INTRAVENOUS | Status: AC
  Administered 2019-10-24: 1000 mg via INTRAVENOUS
  Filled 2019-10-24: qty 100

## 2019-10-24 MED ORDER — PROPOFOL 500 MG/50ML IV EMUL
INTRAVENOUS | Status: DC | PRN
  Administered 2019-10-24: 75 ug/kg/min via INTRAVENOUS

## 2019-10-24 MED ORDER — MIDAZOLAM HCL 2 MG/2ML IJ SOLN
INTRAMUSCULAR | Status: AC
  Filled 2019-10-24: qty 2

## 2019-10-24 MED ORDER — BISACODYL 10 MG RE SUPP: 10.0000 mg | Freq: Every day | RECTAL | Status: DC | PRN

## 2019-10-24 MED ORDER — AMLODIPINE BESYLATE 5 MG PO TABS
5.0000 mg | ORAL_TABLET | Freq: Every day | ORAL | Status: DC
  Administered 2019-10-25: 5 mg via ORAL
  Filled 2019-10-24: qty 1

## 2019-10-24 MED ORDER — WATER FOR IRRIGATION, STERILE IR SOLN
Status: DC | PRN
  Administered 2019-10-24: 2000 mL

## 2019-10-24 MED ORDER — ASPIRIN 81 MG PO CHEW
81.0000 mg | CHEWABLE_TABLET | Freq: Two times a day (BID) | ORAL | Status: DC
  Administered 2019-10-24 – 2019-10-25 (×2): 81 mg via ORAL
  Filled 2019-10-24 (×2): qty 1

## 2019-10-24 MED ORDER — MEPERIDINE HCL 50 MG/ML IJ SOLN: 6.2500 mg | INTRAMUSCULAR | Status: DC | PRN

## 2019-10-24 MED ORDER — ADULT MULTIVITAMIN W/MINERALS CH
1.0000 | ORAL_TABLET | Freq: Every day | ORAL | Status: DC
  Administered 2019-10-25: 1 via ORAL
  Filled 2019-10-24: qty 1

## 2019-10-24 MED ORDER — OXYCODONE-ACETAMINOPHEN 5-325 MG PO TABS: 1.0000 | ORAL_TABLET | ORAL | 0 refills | Status: DC | PRN

## 2019-10-24 MED ORDER — HYDROMORPHONE HCL 1 MG/ML IJ SOLN: 0.2500 mg | INTRAMUSCULAR | Status: DC | PRN

## 2019-10-24 MED ORDER — METOPROLOL SUCCINATE ER 50 MG PO TB24
50.0000 mg | ORAL_TABLET | Freq: Every day | ORAL | Status: DC
  Administered 2019-10-24: 50 mg via ORAL
  Filled 2019-10-24: qty 1

## 2019-10-24 MED ORDER — ASPIRIN EC 81 MG PO TBEC: 81.0000 mg | DELAYED_RELEASE_TABLET | Freq: Two times a day (BID) | ORAL | 0 refills | Status: DC

## 2019-10-24 MED ORDER — FERROUS SULFATE 325 (65 FE) MG PO TABS
325.0000 mg | ORAL_TABLET | Freq: Three times a day (TID) | ORAL | Status: DC
  Administered 2019-10-25: 325 mg via ORAL
  Filled 2019-10-24: qty 1

## 2019-10-24 MED ORDER — MENTHOL 3 MG MT LOZG: 1.0000 | LOZENGE | OROMUCOSAL | Status: DC | PRN

## 2019-10-24 MED ORDER — ACETAMINOPHEN ER 650 MG PO TBCR: 1300.0000 mg | EXTENDED_RELEASE_TABLET | Freq: Three times a day (TID) | ORAL | Status: DC | PRN

## 2019-10-24 MED ORDER — ONDANSETRON HCL 4 MG PO TABS: 4.0000 mg | ORAL_TABLET | Freq: Four times a day (QID) | ORAL | Status: DC | PRN

## 2019-10-24 MED ORDER — CEFAZOLIN SODIUM-DEXTROSE 2-4 GM/100ML-% IV SOLN
2.0000 g | Freq: Four times a day (QID) | INTRAVENOUS | Status: AC
  Administered 2019-10-24 (×2): 2 g via INTRAVENOUS
  Filled 2019-10-24 (×2): qty 100

## 2019-10-24 MED ORDER — FENTANYL CITRATE (PF) 100 MCG/2ML IJ SOLN
INTRAMUSCULAR | Status: AC
  Filled 2019-10-24: qty 2

## 2019-10-24 MED ORDER — METHOCARBAMOL 500 MG IVPB - SIMPLE MED
500.0000 mg | Freq: Four times a day (QID) | INTRAVENOUS | Status: DC | PRN
  Filled 2019-10-24: qty 50

## 2019-10-24 MED ORDER — HYDROCHLOROTHIAZIDE 12.5 MG PO CAPS: 12.5000 mg | ORAL_CAPSULE | ORAL | Status: DC | PRN

## 2019-10-24 MED ORDER — ONDANSETRON HCL 4 MG/2ML IJ SOLN
4.0000 mg | Freq: Four times a day (QID) | INTRAMUSCULAR | Status: DC | PRN
  Administered 2019-10-24: 4 mg via INTRAVENOUS
  Filled 2019-10-24: qty 2

## 2019-10-24 MED ORDER — CEFAZOLIN SODIUM-DEXTROSE 2-4 GM/100ML-% IV SOLN
2.0000 g | INTRAVENOUS | Status: AC
  Administered 2019-10-24: 2 g via INTRAVENOUS
  Filled 2019-10-24: qty 100

## 2019-10-24 SURGICAL SUPPLY — 56 items
BAG SPEC THK2 15X12 ZIP CLS (MISCELLANEOUS) ×1
BAG ZIPLOCK 12X15 (MISCELLANEOUS) ×2 IMPLANT
BLADE SAG 18X100X1.27 (BLADE) ×3 IMPLANT
BLADE SAW SGTL 13X75X1.27 (BLADE) ×3 IMPLANT
BNDG CMPR MED 10X6 ELC LF (GAUZE/BANDAGES/DRESSINGS) ×1
BNDG ELASTIC 6X10 VLCR STRL LF (GAUZE/BANDAGES/DRESSINGS) ×3 IMPLANT
BNDG GAUZE ELAST 4 BULKY (GAUZE/BANDAGES/DRESSINGS) ×3 IMPLANT
BOWL SMART MIX CTS (DISPOSABLE) ×3 IMPLANT
CEMENT HV SMART SET (Cement) ×6 IMPLANT
CEMENT TIBIA MBT SIZE 4 (Knees) IMPLANT
CLOSURE WOUND 1/2 X4 (GAUZE/BANDAGES/DRESSINGS) ×2
COVER SURGICAL LIGHT HANDLE (MISCELLANEOUS) ×3 IMPLANT
COVER WAND RF STERILE (DRAPES) IMPLANT
CUFF TOURN SGL QUICK 34 (TOURNIQUET CUFF) ×3
CUFF TRNQT CYL 34X4.125X (TOURNIQUET CUFF) ×1 IMPLANT
DRAPE SHEET LG 3/4 BI-LAMINATE (DRAPES) ×3 IMPLANT
DRAPE U-SHAPE 47X51 STRL (DRAPES) ×3 IMPLANT
DRSG ADAPTIC 3X8 NADH LF (GAUZE/BANDAGES/DRESSINGS) ×3 IMPLANT
DRSG PAD ABDOMINAL 8X10 ST (GAUZE/BANDAGES/DRESSINGS) ×3 IMPLANT
DURAPREP 26ML APPLICATOR (WOUND CARE) ×3 IMPLANT
ELECT REM PT RETURN 15FT ADLT (MISCELLANEOUS) ×3 IMPLANT
FEMUR SIGMA PS SZ 4.0 L (Knees) ×2 IMPLANT
GAUZE SPONGE 4X4 12PLY STRL (GAUZE/BANDAGES/DRESSINGS) ×3 IMPLANT
GLOVE BIOGEL PI ORTHO PRO 7.5 (GLOVE) ×2
GLOVE BIOGEL PI ORTHO PRO SZ8 (GLOVE) ×2
GLOVE ORTHO TXT STRL SZ7.5 (GLOVE) ×3 IMPLANT
GLOVE PI ORTHO PRO STRL 7.5 (GLOVE) ×1 IMPLANT
GLOVE PI ORTHO PRO STRL SZ8 (GLOVE) ×1 IMPLANT
GLOVE SURG ORTHO 8.5 STRL (GLOVE) ×6 IMPLANT
GOWN STRL REUS W/TWL XL LVL3 (GOWN DISPOSABLE) ×6 IMPLANT
HANDPIECE INTERPULSE COAX TIP (DISPOSABLE) ×3
HOLDER FOLEY CATH W/STRAP (MISCELLANEOUS) ×2 IMPLANT
IMMOBILIZER KNEE 20 (SOFTGOODS) ×3
IMMOBILIZER KNEE 20 THIGH 36 (SOFTGOODS) IMPLANT
KIT TURNOVER KIT A (KITS) IMPLANT
MANIFOLD NEPTUNE II (INSTRUMENTS) ×3 IMPLANT
NS IRRIG 1000ML POUR BTL (IV SOLUTION) ×3 IMPLANT
PACK TOTAL KNEE CUSTOM (KITS) ×3 IMPLANT
PATELLA DOME PFC 35MM (Knees) ×2 IMPLANT
PENCIL SMOKE EVACUATOR (MISCELLANEOUS) ×2 IMPLANT
PIN STEINMAN FIXATION KNEE (PIN) ×2 IMPLANT
PLATE ROT INSERT 15MM SIZE 4 (Plate) ×2 IMPLANT
PROTECTOR NERVE ULNAR (MISCELLANEOUS) ×3 IMPLANT
SET HNDPC FAN SPRY TIP SCT (DISPOSABLE) ×1 IMPLANT
STAPLER VISISTAT 35W (STAPLE) IMPLANT
STRIP CLOSURE SKIN 1/2X4 (GAUZE/BANDAGES/DRESSINGS) ×4 IMPLANT
SUT MNCRL AB 3-0 PS2 18 (SUTURE) ×3 IMPLANT
SUT VIC AB 0 CT1 36 (SUTURE) ×3 IMPLANT
SUT VIC AB 1 CT1 36 (SUTURE) ×9 IMPLANT
SUT VIC AB 2-0 CT1 27 (SUTURE) ×6
SUT VIC AB 2-0 CT1 TAPERPNT 27 (SUTURE) ×1 IMPLANT
TIBIA MBT CEMENT SIZE 4 (Knees) ×3 IMPLANT
TRAY FOLEY MTR SLVR 16FR STAT (SET/KITS/TRAYS/PACK) ×3 IMPLANT
WATER STERILE IRR 1000ML POUR (IV SOLUTION) ×6 IMPLANT
WRAP KNEE MAXI GEL POST OP (GAUZE/BANDAGES/DRESSINGS) ×2 IMPLANT
YANKAUER SUCT BULB TIP 10FT TU (MISCELLANEOUS) ×3 IMPLANT

## 2019-10-24 NOTE — Brief Op Note (Signed)
10/24/2019  9:33 AM  PATIENT:  Tyler Lawrence  72 y.o. male  PRE-OPERATIVE DIAGNOSIS:  Left knee end stage osteoarthritis  POST-OPERATIVE DIAGNOSIS:  Left knee end stage osteoarthritis  PROCEDURE:  Procedure(s): TOTAL KNEE ARTHROPLASTY (Left) DePuy Sigma RP  SURGEON:  Surgeon(s) and Role:    Netta Cedars, MD - Primary  PHYSICIAN ASSISTANT:   ASSISTANTS: Dierdre Highman PA-C   ANESTHESIA:   regional and spinal  EBL:  50 mL   BLOOD ADMINISTERED:none  DRAINS: none   LOCAL MEDICATIONS USED: none    SPECIMEN:  No Specimen  DISPOSITION OF SPECIMEN:  N/A  COUNTS:  YES  TOURNIQUET:  * Missing tourniquet times found for documented tourniquets in log: [id number] *  DICTATION: .Other Dictation: Dictation Number [id number]  PLAN OF CARE: Admit to inpatient   PATIENT DISPOSITION:  PACU - hemodynamically stable.   Delay start of Pharmacological VTE agent (>24hrs) due to surgical blood loss or risk of bleeding: no

## 2019-10-24 NOTE — Anesthesia Postprocedure Evaluation (Signed)
Anesthesia Post Note  Patient: Tyler Lawrence  Procedure(s) Performed: TOTAL KNEE ARTHROPLASTY (Left Knee)     Patient location during evaluation: PACU Anesthesia Type: Spinal Level of consciousness: oriented and awake and alert Pain management: pain level controlled Vital Signs Assessment: post-procedure vital signs reviewed and stable Respiratory status: spontaneous breathing, respiratory function stable and patient connected to nasal cannula oxygen Cardiovascular status: blood pressure returned to baseline and stable Postop Assessment: no headache, no backache and no apparent nausea or vomiting Anesthetic complications: no    Last Vitals:  Vitals:   10/24/19 1209 10/24/19 1304  BP: 125/70 130/74  Pulse: 64 78  Resp: 18 15  Temp: (!) 36.3 C 36.4 C  SpO2: 100% 100%    Last Pain:  Vitals:   10/24/19 1304  TempSrc: Oral  PainSc:                  Mee Macdonnell DAVID

## 2019-10-24 NOTE — Interval H&P Note (Signed)
History and Physical Interval Note:  10/24/2019 7:30 AM  Tyler Lawrence  has presented today for surgery, with the diagnosis of Left knee osteoarthritis.  The various methods of treatment have been discussed with the patient and family. After consideration of risks, benefits and other options for treatment, the patient has consented to  Procedure(s): TOTAL KNEE ARTHROPLASTY (Left) as a surgical intervention.  The patient's history has been reviewed, patient examined, no change in status, stable for surgery.  I have reviewed the patient's chart and labs.  Questions were answered to the patient's satisfaction.     Augustin Schooling

## 2019-10-24 NOTE — Transfer of Care (Signed)
Immediate Anesthesia Transfer of Care Note  Patient: Tyler Lawrence  Procedure(s) Performed: Procedure(s): TOTAL KNEE ARTHROPLASTY (Left)  Patient Location: PACU  Anesthesia Type:Spinal  Level of Consciousness:  sedated, patient cooperative and responds to stimulation  Airway & Oxygen Therapy:Patient Spontanous Breathing and Patient connected to face mask oxgen  Post-op Assessment:  Report given to PACU RN and Post -op Vital signs reviewed and stable  Post vital signs:  Reviewed and stable  Last Vitals:  Vitals:   10/24/19 0533  BP: 132/77  Pulse: 69  Resp: 18  Temp: 36.8 C  SpO2: 123XX123    Complications: No apparent anesthesia complications

## 2019-10-24 NOTE — Discharge Instructions (Signed)
Ice to the knee constantly.  Keep the incision covered and clean and dry for one week, then ok to get it wet in the shower.  Do exercise as instructed every hour if you can.  Keep the knee brace on at night to maintain extension (straightening)  DO NOT prop anything under the knee it will make your knee very stiff.  It is ok to place full weight on the left leg.  Use a walker while you are up and around for balance.    Take 81mg  baby aspirin twice daily and wear your knee high support hose 24/7 to prevent blood clots  Follow up with Dr Veverly Fells in two weeks in the office, call [phone] for appt

## 2019-10-24 NOTE — Evaluation (Addendum)
Physical Therapy Evaluation Patient Details Name: Tyler Lawrence MRN: [id number] DOB: [birth year] Today's Date: 10/24/2019   History of Present Illness  Pt s/p L TKR and with hx of CAD and CABG  Clinical Impression  Pt s/p L TKR and presents with decreased L LE strength/ROM and post op pain limiting functional mobility.  Pt should progress to dc home with family assist.    Follow Up Recommendations Follow surgeon's recommendation for DC plan and follow-up therapies;Home health PT    Equipment Recommendations  Rolling walker with 5" wheels    Recommendations for Other Services       Precautions / Restrictions Precautions Precautions: Knee;Fall Required Braces or Orthoses: Knee Immobilizer - Left Knee Immobilizer - Left: Discontinue once straight leg raise with < 10 degree lag Restrictions Weight Bearing Restrictions: No Other Position/Activity Restrictions: WBAT      Mobility  Bed Mobility Overal bed mobility: Needs Assistance Bed Mobility: Supine to Sit     Supine to sit: Min assist     General bed mobility comments: cues for seqence and use of R LE to self assist  Transfers Overall transfer level: Needs assistance Equipment used: Rolling walker (2 wheeled) Transfers: Sit to/from Stand Sit to Stand: Min assist         General transfer comment: cues for LE management and use of UEs to self assist.  Ambulation/Gait Ambulation/Gait assistance: Min assist Gait Distance (Feet): 47 Feet Assistive device: Rolling walker (2 wheeled) Gait Pattern/deviations: Step-to pattern;Decreased step length - right;Decreased step length - left;Shuffle;Trunk flexed Gait velocity: decr   General Gait Details: cues for sequence, posture, and position from [employer]            Wheelchair Mobility    Modified Rankin (Stroke Patients Only)       Balance Overall balance assessment: Needs assistance Sitting-balance support: No upper extremity supported;Feet  supported Sitting balance-Leahy Scale: Good     Standing balance support: Bilateral upper extremity supported Standing balance-Leahy Scale: Poor                               Pertinent Vitals/Pain Pain Assessment: 0-10 Pain Score: 6  Pain Location: L knee Pain Descriptors / Indicators: Aching;Sore Pain Intervention(s): Limited activity within patient's tolerance;Monitored during session;Premedicated before session;Ice applied    Home Living Family/patient expects to be discharged to:: Private residence Living Arrangements: Spouse/significant other Available Help at Discharge: Family Type of Home: Mobile home Home Access: Stairs to enter Entrance Stairs-Rails: Psychiatric nurse of Steps: 5 Home Layout: One level Home Equipment: Environmental consultant - 4 wheels;Bedside commode      Prior Function Level of Independence: Independent               Hand Dominance        Extremity/Trunk Assessment   Upper Extremity Assessment Upper Extremity Assessment: Overall WFL for tasks assessed    Lower Extremity Assessment Lower Extremity Assessment: LLE deficits/detail    Cervical / Trunk Assessment Cervical / Trunk Assessment: Normal  Communication   Communication: HOH  Cognition Arousal/Alertness: Awake/alert Behavior During Therapy: WFL for tasks assessed/performed Overall Cognitive Status: Within Functional Limits for tasks assessed                                        General Comments      Exercises Total  Joint Exercises Ankle Circles/Pumps: AROM;Both;15 reps;Supine   Assessment/Plan    PT Assessment Patient needs continued PT services  PT Problem List Decreased strength;Decreased range of motion;Decreased activity tolerance;Decreased balance;Decreased mobility;Decreased knowledge of use of DME;Pain       PT Treatment Interventions DME instruction;Gait training;Stair training;Functional mobility training;Therapeutic  activities;Therapeutic exercise;Patient/family education    PT Goals (Current goals can be found in the Care Plan section)  Acute Rehab PT Goals Patient Stated Goal: Regain IND PT Goal Formulation: With patient Time For Goal Achievement: 10/31/19 Potential to Achieve Goals: Good    Frequency 7X/week   Barriers to discharge        Co-evaluation               AM-PAC PT "6 Clicks" Mobility  Outcome Measure Help needed turning from your back to your side while in a flat bed without using bedrails?: A Little Help needed moving from lying on your back to sitting on the side of a flat bed without using bedrails?: A Little Help needed moving to and from a bed to a chair (including a wheelchair)?: A Little Help needed standing up from a chair using your arms (e.g., wheelchair or bedside chair)?: A Little Help needed to walk in hospital room?: A Little Help needed climbing 3-5 steps with a railing? : A Lot 6 Click Score: 17    End of Session Equipment Utilized During Treatment: Gait belt;Left knee immobilizer Activity Tolerance: Patient tolerated treatment well Patient left: in chair;with call bell/phone within reach;with chair alarm set Nurse Communication: Mobility status PT Visit Diagnosis: Difficulty in walking, not elsewhere classified (R26.2)    Time: 1531-1600 PT Time Calculation (min) (ACUTE ONLY): 20 min   Charges:   PT Evaluation $PT Eval Low Complexity: 1 Low  PT Treatments $Gait Training 8-22 mins        Debe Coder PT Acute Rehabilitation Services Pager [phone] Office [phone]   Seymone Forlenza 10/24/2019, 5:10 PM

## 2019-10-24 NOTE — Op Note (Signed)
NAMEKELSY, MURROW MEDICAL RECORD [id number] ACCOUNT [id number] DATE OF BIRTH:June 15, 1946 FACILITY: [facility] LOCATION: [facility] PHYSICIAN:STEVEN Orlena Sheldon, MD  OPERATIVE REPORT  DATE OF PROCEDURE:  10/24/2019  PREOPERATIVE DIAGNOSIS:  Left knee end-stage osteoarthritis.  POSTOPERATIVE DIAGNOSIS:  Left knee end-stage osteoarthritis.  PROCEDURE PERFORMED:  Left total knee arthroplasty using DePuy Sigma rotating platform prosthesis.  ATTENDING SURGEON:  Doran Heater. Veverly Fells, MD  ASSISTANT:  Lacie Draft, PA-C.  ANESTHESIA:  Spinal anesthesia plus adductor canal block was used.  ESTIMATED BLOOD LOSS:  Minimal.  FLUID REPLACEMENT:  1500 mL crystalloid.  INSTRUMENT COUNTS:  Correct.  COMPLICATIONS:  No complications.  ANTIBIOTICS:  Perioperative antibiotics were given.  TOURNIQUET TIME:  1 hour and 15 minutes at 300 mmHg.  INDICATIONS:  The patient is a 73 year old male with worsening left knee pain and deformity and dysfunction secondary to end-stage osteoarthritis.  The patient has had progressive pain and progressive varus deformity despite conservative management.  He  presents now with bone-on-bone arthritis desiring total knee arthroplasty to restore function and eliminate pain.  Informed consent obtained.  DESCRIPTION OF PROCEDURE:  After an adequate level of spinal anesthesia plus adductor canal block, anesthesia was achieved.  The patient was positioned supine on the operating table.  Left leg correctly identified.  Nonsterile tourniquet was placed on  the proximal thigh and left leg sterilely prepped and draped in the usual manner.  Time-out called, verifying correct patient, correct site.  We elevated the leg, exsanguinated with an Esmarch bandage and elevated the tourniquet to 300 mmHg.  We then  placed the knee in flexion performed a midline incision using a 10 blade scalpel.  Dissection down through the subcutaneous tissues using that same scalpel.  We identified the  peripatellar tissues and performed a median parapatellar arthrotomy with a  fresh 10 blade scalpel.  We divided lateral patellofemoral ligaments, everted the patella, entered the distal femur with a step cut drill.  We then placed our intramedullary resection guide and resected 10 mm of bone off the distal femur set on 5 degrees  left.  We then sized the femur to a size 4 anterior down and performed anterior, posterior and chamfer cuts with a 4-in-1 block.  We then ACL, PCL, meniscal tissues, subluxed the tibia anteriorly.  Noted there to be severe wear over the medial aspect of  the tibia.  We performed our tibial cut perpendicular to the long axis of tibia with just 2 mm resection off the affected medial side.  We had minimal posterior slope for this posterior cruciate substituting prosthesis.  We used an oscillating saw.  We  performed our tibial cut.  We then used a lamina spreader and removed excess osteophytes off the posterior femur with an osteotome.  Next, we checked our gaps, which were symmetric at 12.5 mm.  We removed our pins and then completed our tibial  preparation with the modular drill and keel punch for the size 4 tibia.  We had to pay attention to our rotation on our tibial tray as the patient had significant rotation of the proximal tibia with a laterally placed tibial tubercle and some tibia vara  proximally.  So we did externally rotate the tibial tray relative to the tubercle to improve stability and then went ahead and did our modular drill and keel punch for the preparation.  We left our trial 4 tibia in place.  We then did our box cut for our  4 left femur and then placed that component in  place.  We used a 12.5 poly initially and then went with a 15, but noted excellent stability and good alignment.  We then resurfaced our patella going from a 25 mm thickness down to a 16 mm thickness with  the patellar cutting guide and an oscillating saw.  We then drilled our lug holes for the  35 patellar button and placed our patellar button in place and then ranged the knee and had excellent patellar tracking with no-touch technique.  We removed all  trial components and then pulse irrigated the knee with normal saline.  We then dried the cancellous bone and then vacuum mixed DePuy high viscosity cement on the back table.  Next, we went ahead and cemented the components into place with the 4 tibia, 4  left femur, 15 spacer and then the 35 patellar button.  We used a patellar clamp to hold the patellar button in place until cement was hardened.  We also had the knee in extension with good compression on the components.  We removed all excess cement  with a quarter-inch curved osteotome and pickups.  Once we had all the cement removed, we irrigated thoroughly, checked to make sure we were stable both in flexion and extension, which we were.  Patellar tracking was excellent.  We removed our trial poly  spacer and selected our size 4, 15 spacer and placed that on the tibia and then reduced the femur.  We had a nice little pop as that medial condyle reduced over and again were nice and happy with the stability both in flexion and extension.  We  irrigated again with a pulse irrigator and then closed the parapatellar arthrotomy with #1 Vicryl suture followed by 2-0 Vicryl for subcutaneous closure and 4-0 Monocryl for skin.  Steri-Strips applied followed by sterile dressing.  The patient tolerated  surgery well.  TN/NUANCE  D:10/24/2019 T:10/24/2019 JOB:[id number]

## 2019-10-25 LAB — CBC
HCT: 37.6 % — ABNORMAL LOW (ref 39.0–52.0)
Hemoglobin: 12.9 g/dL — ABNORMAL LOW (ref 13.0–17.0)
MCH: 32.3 pg (ref 26.0–34.0)
MCHC: 34.3 g/dL (ref 30.0–36.0)
MCV: 94 fL (ref 80.0–100.0)
Platelets: 203 10*3/uL (ref 150–400)
RBC: 4 MIL/uL — ABNORMAL LOW (ref 4.22–5.81)
RDW: 12 % (ref 11.5–15.5)
WBC: 16.3 10*3/uL — ABNORMAL HIGH (ref 4.0–10.5)
nRBC: 0 % (ref 0.0–0.2)

## 2019-10-25 LAB — BASIC METABOLIC PANEL
Anion gap: 12 (ref 5–15)
BUN: 16 mg/dL (ref 8–23)
CO2: 21 mmol/L — ABNORMAL LOW (ref 22–32)
Calcium: 8.4 mg/dL — ABNORMAL LOW (ref 8.9–10.3)
Chloride: 102 mmol/L (ref 98–111)
Creatinine, Ser: 0.9 mg/dL (ref 0.61–1.24)
GFR calc Af Amer: >60 mL/min (ref >60)
GFR calc non Af Amer: >60 mL/min (ref >60)
Glucose, Bld: 181 mg/dL — ABNORMAL HIGH (ref 70–99)
Potassium: 4.1 mmol/L (ref 3.5–5.1)
Sodium: 135 mmol/L (ref 135–145)

## 2019-10-25 NOTE — Progress Notes (Signed)
Pt reminded he needs to wait on rolling walker for d/c. Pt not sure now he wants to go  Home due to due pain and mobility concerns. Rn encouraging pt that pain is normal after surgery and movement is good.

## 2019-10-25 NOTE — Progress Notes (Addendum)
Physical Therapy Treatment Patient Details Name: Tyler Lawrence MRN: [id number] DOB: Jul 04, 1946 Today's Date: 10/25/2019    History of Present Illness Pt s/p L TKR and with hx of CAD and CABG    PT Comments    Pt very cooperative but progress is pain limited.  Follow Up Recommendations  Follow surgeon's recommendation for DC plan and follow-up therapies;Home health PT     Equipment Recommendations  Rolling walker with 5" wheels    Recommendations for Other Services       Precautions / Restrictions Precautions Precautions: Knee;Fall Required Braces or Orthoses: Knee Immobilizer - Left Knee Immobilizer - Left: Discontinue once straight leg raise with < 10 degree lag Restrictions Weight Bearing Restrictions: No Other Position/Activity Restrictions: WBAT    Mobility  Bed Mobility Overal bed mobility: Needs Assistance Bed Mobility: Supine to Sit     Supine to sit: Min assist     General bed mobility comments: cues for seqence and use of R LE to self assist  Transfers Overall transfer level: Needs assistance Equipment used: Rolling walker (2 wheeled) Transfers: Sit to/from Stand Sit to Stand: Min assist;Min guard         General transfer comment: cues for LE management and use of UEs to self assist.  Ambulation/Gait Ambulation/Gait assistance: Min assist Gait Distance (Feet): 80 Feet Assistive device: Rolling walker (2 wheeled) Gait Pattern/deviations: Step-to pattern;Decreased step length - right;Decreased step length - left;Shuffle;Trunk flexed Gait velocity: decr   General Gait Details: cues for sequence, posture, and position from [employer]             Wheelchair Mobility    Modified Rankin (Stroke Patients Only)       Balance Overall balance assessment: Needs assistance Sitting-balance support: No upper extremity supported;Feet supported Sitting balance-Leahy Scale: Good     Standing balance support: Bilateral upper extremity  supported Standing balance-Leahy Scale: Fair                              Cognition Arousal/Alertness: Awake/alert Behavior During Therapy: WFL for tasks assessed/performed Overall Cognitive Status: Within Functional Limits for tasks assessed                                        Exercises Total Joint Exercises Ankle Circles/Pumps: AROM;Both;15 reps;Supine Quad Sets: AROM;Both;5 reps;Supine Heel Slides: AAROM;Left;5 reps;Supine Straight Leg Raises: AAROM;Left;5 reps;Supine Goniometric ROM: AAROM L knee -8 - 30    General Comments        Pertinent Vitals/Pain Pain Assessment: 0-10 Pain Score: 9  Pain Location: L knee Pain Descriptors / Indicators: Aching;Sore Pain Intervention(s): Limited activity within patient's tolerance;Monitored during session;Premedicated before session;Ice applied;Patient requesting pain meds-RN notified    Home Living                      Prior Function            PT Goals (current goals can now be found in the care plan section) Acute Rehab PT Goals Patient Stated Goal: Regain IND PT Goal Formulation: With patient Time For Goal Achievement: 10/31/19 Potential to Achieve Goals: Good Progress towards PT goals: Progressing toward goals    Frequency    7X/week      PT Plan Current plan remains appropriate    Co-evaluation  AM-PAC PT "6 Clicks" Mobility   Outcome Measure  Help needed turning from your back to your side while in a flat bed without using bedrails?: A Little Help needed moving from lying on your back to sitting on the side of a flat bed without using bedrails?: A Little Help needed moving to and from a bed to a chair (including a wheelchair)?: A Little Help needed standing up from a chair using your arms (e.g., wheelchair or bedside chair)?: A Little Help needed to walk in hospital room?: A Little Help needed climbing 3-5 steps with a railing? : A Lot 6 Click  Score: 17    End of Session Equipment Utilized During Treatment: Gait belt;Left knee immobilizer Activity Tolerance: Patient limited by pain Patient left: in chair;with call bell/phone within reach;with chair alarm set Nurse Communication: Mobility status PT Visit Diagnosis: Difficulty in walking, not elsewhere classified (R26.2)     Time: 0933-1000 PT Time Calculation (min) (ACUTE ONLY): 44 min  Charges:  $Gait Training: 8-22 mins $Therapeutic Exercise: 8-22 mins                     [place] Pager [phone] Office [phone]    Ezell Poke 10/25/2019, 3:18 PM

## 2019-10-25 NOTE — TOC Progression Note (Signed)
Transition of Care [facility]) - Progression Note    Patient Details  Name: Tyler Lawrence MRN: [id number] Date of Birth: 1946/09/25  [facility] of [facility]) CM/SW Contact  Joaquin Courts, RN Phone Number: 10/25/2019, 12:13 PM  Clinical Narrative:    CM spoke with patient at bedside, patient set up with [facility] for [place]. [employer] to provide rolling walker for home use, patient reports has a 3-in-1.     Expected Discharge Plan: [place] Barriers to Discharge: No Barriers Identified  Expected Discharge Plan and Services Expected Discharge Plan: [place]   Discharge Planning Services: CM Consult Post Acute Care Choice: [place] arrangements for the past 2 months: Single Family Home Expected Discharge Date: 10/25/19               DME Arranged: [place]ford Rile rolling DME Agency: [employer] Date DME Agency Contacted: 10/25/19 Time DME Agency Contacted: 1211 Representative spoke with at DME Agency: [place]: PT [place]: [facility] [employer]) Date [place]: 10/25/19 Time [place]: 1212 Representative spoke with at [place]: [place] ([place]) Interventions    Readmission Risk Interventions No flowsheet data found.

## 2019-10-25 NOTE — Progress Notes (Signed)
Physical Therapy Treatment Patient Details Name: Tyler Lawrence MRN: [id number] DOB: 1946/02/06 Today's Date: 10/25/2019    History of Present Illness Pt s/p L TKR and with hx of CAD and CABG    PT Comments    Pt with marked improvement in pain control this pm and marked improvement in activity tolerance.  Pt ambulated increased distance in halls, navigated stairs and performed home therex program with assist - written instruction provided and reviewed.  Follow Up Recommendations  Follow surgeon's recommendation for DC plan and follow-up therapies;Home health PT     Equipment Recommendations  Rolling walker with 5" wheels    Recommendations for Other Services       Precautions / Restrictions Precautions Precautions: Knee;Fall Required Braces or Orthoses: Knee Immobilizer - Left Knee Immobilizer - Left: Discontinue once straight leg raise with < 10 degree lag(Pt performed IND SLR this pm) Restrictions Weight Bearing Restrictions: No Other Position/Activity Restrictions: WBAT    Mobility  Bed Mobility Overal bed mobility: Needs Assistance Bed Mobility: Supine to Sit;Sit to Supine     Supine to sit: Supervision Sit to supine: Supervision   General bed mobility comments: cues for seqence and use of R LE to self assist  Transfers Overall transfer level: Needs assistance Equipment used: Rolling walker (2 wheeled) Transfers: Sit to/from Stand Sit to Stand: Min guard;Supervision         General transfer comment: cues for LE management and use of UEs to self assist.  Ambulation/Gait Ambulation/Gait assistance: Min guard;Supervision Gait Distance (Feet): 100 Feet Assistive device: Rolling walker (2 wheeled) Gait Pattern/deviations: Step-to pattern;Decreased step length - right;Decreased step length - left;Shuffle;Trunk flexed Gait velocity: decr   General Gait Details: cues for sequence, posture, and position from RW   Stairs Stairs: Yes Stairs assistance: Min  assist Stair Management: One rail Right;Step to pattern;Forwards;With cane Number of Stairs: 5 General stair comments: cues for sequence and foot/crutch placement   Wheelchair Mobility    Modified Rankin (Stroke Patients Only)       Balance Overall balance assessment: Needs assistance Sitting-balance support: No upper extremity supported;Feet supported Sitting balance-Leahy Scale: Good     Standing balance support: Bilateral upper extremity supported Standing balance-Leahy Scale: Fair                              Cognition Arousal/Alertness: Awake/alert Behavior During Therapy: WFL for tasks assessed/performed Overall Cognitive Status: Within Functional Limits for tasks assessed                                        Exercises Total Joint Exercises Ankle Circles/Pumps: AROM;Both;15 reps;Supine Quad Sets: AROM;Both;Supine;10 reps Heel Slides: AAROM;Left;Supine;10 reps Straight Leg Raises: AAROM;Left;Supine;10 reps Long Arc Quad: AROM;Left;10 reps;Seated Goniometric ROM: AAROM R knee-8 - 40    General Comments        Pertinent Vitals/Pain Pain Assessment: 0-10 Pain Score: 4  Pain Location: L knee Pain Descriptors / Indicators: Aching;Sore Pain Intervention(s): Limited activity within patient's tolerance;Monitored during session;Premedicated before session    Home Living                      Prior Function            PT Goals (current goals can now be found in the care plan section) Acute Rehab PT Goals Patient Stated  Goal: Regain IND PT Goal Formulation: With patient Time For Goal Achievement: 10/31/19 Potential to Achieve Goals: Good Progress towards PT goals: Progressing toward goals    Frequency    7X/week      PT Plan Current plan remains appropriate    Co-evaluation              AM-PAC PT "6 Clicks" Mobility   Outcome Measure  Help needed turning from your back to your side while in a flat bed  without using bedrails?: A Little Help needed moving from lying on your back to sitting on the side of a flat bed without using bedrails?: A Little Help needed moving to and from a bed to a chair (including a wheelchair)?: A Little Help needed standing up from a chair using your arms (e.g., wheelchair or bedside chair)?: A Little Help needed to walk in hospital room?: A Little Help needed climbing 3-5 steps with a railing? : A Little 6 Click Score: 18    End of Session Equipment Utilized During Treatment: Gait belt Activity Tolerance: Patient limited by pain Patient left: with call bell/phone within reach;in bed;with bed alarm set Nurse Communication: Mobility status PT Visit Diagnosis: Difficulty in walking, not elsewhere classified (R26.2)     Time: [id number] PT Time Calculation (min) (ACUTE ONLY): 39 min  Charges:  $Gait Training: 8-22 mins $Therapeutic Exercise: 8-22 mins $Therapeutic Activity: 8-22 mins                     Debe Coder PT Acute Rehabilitation Services Pager [phone] Office [phone]    Armonte Tortorella 10/25/2019, 3:27 PM

## 2019-10-25 NOTE — Plan of Care (Signed)
  Problem: Pain Management: Goal: Pain level will decrease with appropriate interventions Outcome: Progressing   Problem: Activity: Goal: Ability to avoid complications of mobility impairment will improve Outcome: Progressing   Problem: Skin Integrity: Goal: Will show signs of wound healing Outcome: Progressing   Problem: Activity: Goal: Range of joint motion will improve Outcome: Progressing

## 2019-10-25 NOTE — Progress Notes (Signed)
Subjective: 1 Day Post-Op Procedure(s) (LRB): TOTAL KNEE ARTHROPLASTY (Left) Patient reports pain as mild.   Patient seen in rounds for Dr. Veverly Fells. Patient is well, and has had no acute complaints or problems other than discomfort in the left knee. No acute events overnight. Voiding without difficulty, positive flatus. He is motivated to discharge home as soon as possible, either today or tomorrow.  We will start therapy today.   Objective: Vital signs in last 24 hours: Temp:  [97.4 F (36.3 C)-98.6 F (37 C)] 98.6 F (37 C) (12/05 0835) Pulse Rate:  [49-93] 78 (12/05 0835) Resp:  [11-18] 16 (12/05 0835) BP: (93-130)/(60-85) 120/65 (12/05 0835) SpO2:  [98 %-100 %] 98 % (12/05 0835)  Intake/Output from previous day:  Intake/Output Summary (Last 24 hours) at 10/25/2019 0906 Last data filed at 10/25/2019 0725 Gross per 24 hour  Intake 2813.53 ml  Output 3070 ml  Net -256.47 ml     Intake/Output this shift: Total I/O In: 70.8 [I.V.:70.8] Out: -   Labs: Recent Labs    10/25/19 0209  HGB 12.9*   Recent Labs    10/25/19 0209  WBC 16.3*  RBC 4.00*  HCT 37.6*  PLT 203   Recent Labs    10/25/19 0209  NA 135  K 4.1  CL 102  CO2 21*  BUN 16  CREATININE 0.90  GLUCOSE 181*  CALCIUM 8.4*   No results for input(s): LABPT, INR in the last 72 hours.  Exam: General - Patient is Alert and Oriented Extremity - Neurologically intact Sensation intact distally Intact pulses distally Dorsiflexion/Plantar flexion intact Dressing - dressing C/D/I Motor Function - intact, moving foot and toes well on exam.   Past Medical History:  Diagnosis Date  . Arthritis    HANDS HURT.  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 Back pain    PT STATES HE HAS FLARE UPS OF LOWER BACK AND LEFT LEG PAIN -MAYBE A LITTLE NUMBNESS--PT STATES HE WAS TOLD HE HAS A PINCHED NERVE.  Marland Kitchen CAD (coronary artery disease)    DR. Loomis; CABG  2001,HEART CATH 2005 -PATENT GRAFTS, "LUMINAL IRREGULARITY IN THE VEIN TO THE DIAGONAL VESSEL.  HIS RCA WHICH WAS NONDOMINANT, HAD 78% STENOSIS."  NUCLEAR STRESS TEST 04/04/12--"ESSENTIALLY UNCHANGED FROM PRIOR STUDY AND SHOWED A SMALL PERFUSION DEFECT IN THE BASAL ANTERIOR AND BASAL ANTEROSEPTAL REGION CONSISTENT WITH PROXIMAL LAD SCAR. "  . Complication of anesthesia    PT STATES SURGERY [facility]  SPINAL ATTEMPTED FOR HEMORRHOIDECTOMY AND HERNIA REPAIR.  PT STATES WHEN NEEDLE WAS PUT IN HIS BACK - HE FELT ELECTRICAL SHOCK DOWN LEFT GROIN AND LEFT LEG.  PT STATES THAT HE WAS THEN PUT TO  SLEEP.  . Fever blister 07/25/12   ON UPPER LIP  . GERD (gastroesophageal reflux disease)   . Heart palpitations    occasional   . Hemorrhoid    ARE ITCHING AND UNCOMFORTABLE  . Hypertension   . Incarcerated incisional hernia 07/08/2018  . Left bundle branch block    CHRONIC  . Protruding sternal wires   . 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 Weakness of left arm    slight left arm weakness      Assessment/Plan: 1 Day Post-Op Procedure(s) (LRB): TOTAL KNEE ARTHROPLASTY (Left) Active Problems:   Status post total knee replacement, left  Estimated body mass index is 27.02 kg/m as calculated from the following:   Height as of this encounter: 5\' 9"  (1.753 m).   Weight as of this encounter: 83 kg. Advance diet Up with therapy   Patient's anticipated LOS is less than 2 midnights, meeting these requirements:  - Lives within 1 hour of care - Has a competent adult at home to recover with post-op recover - NO history of  - Chronic pain requiring opiods  - Diabetes  - Coronary Artery Disease  - Heart failure  - Heart attack  - Stroke  - DVT/VTE  - Cardiac arrhythmia  - Respiratory Failure/COPD  - Renal failure  - Anemia  - Advanced Liver disease  DVT Prophylaxis - Aspirin Weight bearing as tolerated. D/C O2 and pulse ox  and try on room air.  Plan is to go Home after hospital stay. Possible discharge home today vs tomorrow depending on progression with therapy. He will work with therapy today, and if he is meeting his goals he may discharge home today. Dressing to be changed to [employer] before discharge. Scheduled for OPPT. Follow up in the office in 2 weeks.   Griffith Citron, PA-C Orthopedic Surgery [phone] 10/25/2019, 9:06 AM

## 2019-10-27 ENCOUNTER — Encounter (HOSPITAL_COMMUNITY): Payer: Self-pay | Admitting: Orthopedic Surgery

## 2019-10-27 DIAGNOSIS — Z96652 Presence of left artificial knee joint: Secondary | ICD-10-CM | POA: Diagnosis not present

## 2019-10-27 NOTE — Discharge Summary (Signed)
Physician Discharge Summary   Patient ID: Tyler Lawrence MRN: [id number] DOB/AGE: [birth year] 73 y.o.  Admit date: 10/24/2019 Discharge date: 10/25/2019  Primary Diagnosis: Left knee end-stage osteoarthritis.  Admission Diagnoses:  Past Medical History:  Diagnosis Date  . Arthritis    HANDS HURT.  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 Back pain    PT STATES HE HAS FLARE UPS OF LOWER BACK AND LEFT LEG PAIN -MAYBE A LITTLE NUMBNESS--PT STATES HE WAS TOLD HE HAS A PINCHED NERVE.  Marland Kitchen CAD (coronary artery disease)    DR. [place]; CABG 2001,HEART CATH 2005 -PATENT GRAFTS, "LUMINAL IRREGULARITY IN THE VEIN TO THE DIAGONAL VESSEL.  HIS RCA WHICH WAS NONDOMINANT, HAD 78% STENOSIS."  NUCLEAR STRESS TEST 04/04/12--"ESSENTIALLY UNCHANGED FROM PRIOR STUDY AND SHOWED A SMALL PERFUSION DEFECT IN THE BASAL ANTERIOR AND BASAL ANTEROSEPTAL REGION CONSISTENT WITH PROXIMAL LAD SCAR. "  . Complication of anesthesia    PT STATES SURGERY AT [facility] 2012  SPINAL ATTEMPTED FOR HEMORRHOIDECTOMY AND HERNIA REPAIR.  PT STATES WHEN NEEDLE WAS PUT IN HIS BACK - HE FELT ELECTRICAL SHOCK DOWN LEFT GROIN AND LEFT LEG.  PT STATES THAT HE WAS THEN PUT TO  SLEEP.  . Fever blister 07/25/12   ON UPPER LIP  . GERD (gastroesophageal reflux disease)   . Heart palpitations    occasional   . Hemorrhoid    ARE ITCHING AND UNCOMFORTABLE  . Hypertension   . Incarcerated incisional hernia 07/08/2018  . Left bundle branch block    CHRONIC  . Protruding sternal wires   . 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 Weakness of left arm    slight left arm weakness     Discharge Diagnoses:   Active Problems:   Status post total knee replacement, left  Estimated body mass index is 27.02 kg/m as calculated from the following:   Height as of this encounter: 5\' 9"  (1.753 m).   Weight as of this encounter: 83 kg.   Procedure:  Procedure(s) (LRB): TOTAL KNEE ARTHROPLASTY (Left)   Consults: None  HPI:  The patient is a 73 year old male with worsening left knee pain and deformity and dysfunction secondary to end-stage osteoarthritis.  The patient has had progressive pain and progressive varus deformity despite conservative management.  He  presents now with bone-on-bone arthritis desiring total knee arthroplasty to restore function and eliminate pain.  Informed consent obtained.  Laboratory Data: Admission on 10/24/2019, Discharged on 10/25/2019  Component Date Value Ref Range Status  . WBC 10/25/2019 16.3* 4.0 - 10.5 K/uL Final  . RBC 10/25/2019 4.00* 4.22 - 5.81 MIL/uL Final  . Hemoglobin 10/25/2019 12.9* 13.0 - 17.0 g/dL Final  . HCT 10/25/2019 37.6* 39.0 - 52.0 % Final  . MCV 10/25/2019 94.0  80.0 - 100.0 fL Final  . MCH 10/25/2019 32.3  26.0 - 34.0 pg Final  . MCHC 10/25/2019 34.3  30.0 - 36.0 g/dL Final  . RDW 10/25/2019 12.0  11.5 - 15.5 % Final  . Platelets 10/25/2019 203  150 - 400 K/uL Final  . nRBC 10/25/2019 0.0  0.0 - 0.2 % Final   Performed at [facility], [place] [address], [place], [place] [address]  . Sodium 10/25/2019 135  135 - 145 mmol/L Final  . Potassium 10/25/2019 4.1  3.5 - 5.1 mmol/L Final  .  Chloride 10/25/2019 102  98 - 111 mmol/L Final  . CO2 10/25/2019 21* 22 - 32 mmol/L Final  . Glucose, Bld 10/25/2019 181* 70 - 99 mg/dL Final  . BUN 10/25/2019 16  8 - 23 mg/dL Final  . Creatinine, Ser 10/25/2019 0.90  0.61 - 1.24 mg/dL Final  . Calcium 10/25/2019 8.4* 8.9 - 10.3 mg/dL Final  . GFR calc non Af Amer 10/25/2019 >60  >60 mL/min Final  . GFR calc Af Amer 10/25/2019 >60  >60 mL/min Final  . Anion gap 10/25/2019 12  5 - 15 Final   Performed at [facility], [place] [address] [place], [place] [address]  Hospital Outpatient Visit on 10/22/2019  Component Date Value Ref Range Status  . Sodium 10/22/2019 139  135 - 145 mmol/L Final  .  Potassium 10/22/2019 4.3  3.5 - 5.1 mmol/L Final  . Chloride 10/22/2019 105  98 - 111 mmol/L Final  . CO2 10/22/2019 26  22 - 32 mmol/L Final  . Glucose, Bld 10/22/2019 111* 70 - 99 mg/dL Final  . BUN 10/22/2019 18  8 - 23 mg/dL Final  . Creatinine, Ser 10/22/2019 0.76  0.61 - 1.24 mg/dL Final  . Calcium 10/22/2019 9.5  8.9 - 10.3 mg/dL Final  . GFR calc non Af Amer 10/22/2019 >60  >60 mL/min Final  . GFR calc Af Amer 10/22/2019 >60  >60 mL/min Final  . Anion gap 10/22/2019 8  5 - 15 Final   Performed at [facility], [place] [address] [place], [place] [address]  . WBC 10/22/2019 10.0  4.0 - 10.5 K/uL Final  . RBC 10/22/2019 4.85  4.22 - 5.81 MIL/uL Final  . Hemoglobin 10/22/2019 15.4  13.0 - 17.0 g/dL Final  . HCT 10/22/2019 45.3  39.0 - 52.0 % Final  . MCV 10/22/2019 93.4  80.0 - 100.0 fL Final  . MCH 10/22/2019 31.8  26.0 - 34.0 pg Final  . MCHC 10/22/2019 34.0  30.0 - 36.0 g/dL Final  . RDW 10/22/2019 12.1  11.5 - 15.5 % Final  . Platelets 10/22/2019 218  150 - 400 K/uL Final  . nRBC 10/22/2019 0.0  0.0 - 0.2 % Final   Performed at [facility], [place] [address], [place], [place] [address]  . MRSA, PCR 10/22/2019 NEGATIVE  NEGATIVE Final  . Staphylococcus aureus 10/22/2019 POSITIVE* NEGATIVE Final   Comment: (NOTE) The Xpert SA Assay (FDA approved for NASAL specimens in patients 46 years of age and older), is one component of a comprehensive surveillance program. It is not intended to diagnose infection nor to guide or monitor treatment. Performed at [facility], [place] [address], [place],  [address]   Hospital Outpatient Visit on 10/21/2019  Component Date Value Ref Range Status  . SARS-CoV-2, NAA 10/21/2019 NOT DETECTED  NOT DETECTED Final   Comment: (NOTE) This nucleic acid amplification test was developed and its performance characteristics determined by [employer]. Nucleic acid amplification tests  include PCR and TMA. This test has not been FDA cleared or approved. This test has been authorized by FDA under an Emergency Use Authorization (EUA). This test is only authorized for the duration of time the declaration that circumstances exist justifying the authorization of the emergency use of in vitro diagnostic tests for detection of SARS-CoV-2 virus and/or diagnosis of COVID-19 infection under section 564(b)(1) of the Act, 21 U.S.C. [id number]) (1), unless the authorization is terminated or revoked sooner. When diagnostic testing is negative,  the possibility of a false negative result should be considered in the context of a patient's recent exposures and the presence of clinical signs and symptoms consistent with COVID-19. An individual without symptoms of COVID- 19 and who is not shedding SARS-CoV-2 vi                          rus would expect to have a negative (not detected) result in this assay. Performed At: [facility] [place], [place] [id number] Rush Farmer MD [id number]   . Coronavirus Source 10/21/2019 NASOPHARYNGEAL   Final   Performed at [facility], [place] [address], [place],  [address]  Hospital Outpatient Visit on 09/10/2019  Component Date Value Ref Range Status  . Rest HR 09/10/2019 51  bpm Final  . Rest BP 09/10/2019 127/66  mmHg Final  . Peak HR 09/10/2019 95  bpm Final  . Peak BP 09/10/2019 138/63  mmHg Final  . SSS 09/10/2019 5   Final  . SRS 09/10/2019 2   Final  . SDS 09/10/2019 3   Final  . TID 09/10/2019 1.12   Final  . LV sys vol 09/10/2019 55  mL Final  . LV dias vol 09/10/2019 117  62 - 150 mL Final     X-Rays:No results found.  EKG: Orders placed or performed in visit on 05/19/19  . EKG 12-Lead     Hospital Course: Tyler Lawrence is a 73 y.o. who was admitted to [facility]. They were brought to the operating room on 10/24/2019 and underwent Procedure(s): TOTAL KNEE ARTHROPLASTY.  Patient  tolerated the procedure well and was later transferred to the recovery room and then to the orthopaedic floor for postoperative care. They were given PO and IV analgesics for pain control following their surgery. They were given 24 hours of postoperative antibiotics of  Anti-infectives (From admission, onward)   Start     Dose/Rate Route Frequency Ordered Stop   10/24/19 1400  ceFAZolin (ANCEF) IVPB 2g/100 mL premix     2 g 200 mL/hr over 30 Minutes Intravenous Every 6 hours 10/24/19 1109 10/24/19 2002   10/24/19 0600  ceFAZolin (ANCEF) IVPB 2g/100 mL premix     2 g 200 mL/hr over 30 Minutes Intravenous On call to O.R. 10/24/19 [id number] 10/24/19 0734     and started on DVT prophylaxis in the form of Aspirin.   PT and OT were ordered for total joint protocol. Discharge planning consulted to help with postop disposition and equipment needs.  Patient had a good night on the evening of surgery. They started to get up OOB with therapy on POD #1. Pt was seen during rounds and was ready to go home pending progress with therapy.  He worked with therapy on POD #1 and was meeting his goals. Pt was discharged to home later that day in stable condition.  Diet: Regular diet Activity: WBAT Follow-up: in 2 weeks Disposition: Home Discharged Condition: good   Discharge Instructions    Call MD / Call 911   Complete by: As directed    If you experience chest pain or shortness of breath, CALL 911 and be transported to the hospital emergency room.  If you develope a fever above 101 F, pus (white drainage) or increased drainage or redness at the wound, or calf pain, call your surgeon's office.   Constipation Prevention   Complete by: As directed    Drink plenty of  fluids.  Prune juice may be helpful.  You may use a stool softener, such as Colace (over the counter) 100 mg twice a day.  Use MiraLax (over the counter) for constipation as needed.   Diet - low sodium heart healthy   Complete by: As directed    Do not  put a pillow under the knee. Place it under the heel.   Complete by: As directed    Driving restrictions   Complete by: As directed    No driving for two weeks   TED hose   Complete by: As directed    Use stockings (TED hose) for three weeks on both leg(s).  You may remove them at night for sleeping.   Weight bearing as tolerated   Complete by: As directed      Allergies as of 10/25/2019   No Known Allergies     Medication List    TAKE these medications   acetaminophen 650 MG CR tablet Commonly known as: TYLENOL Take 1,300 mg by mouth every 8 (eight) hours as needed for pain.   amLODipine 5 MG tablet Commonly known as: NORVASC TAKE 1 TABLET BY MOUTH EVERY DAY What changed: when to take this   aspirin EC 81 MG tablet Take 1 tablet (81 mg total) by mouth 2 (two) times daily. What changed: when to take this   CINNAMON PO Take 1 tablet by mouth daily.   hydrochlorothiazide 12.5 MG capsule Commonly known as: MICROZIDE Take 1 capsule (12.5 mg total) by mouth as needed (for leg swelling).   levocetirizine 5 MG tablet Commonly known as: XYZAL Take 5 mg by mouth daily as needed for allergies.   losartan 100 MG tablet Commonly known as: COZAAR Take 100 mg by mouth at bedtime.   methocarbamol 500 MG tablet Commonly known as: Robaxin Take 1 tablet (500 mg total) by mouth every 6 (six) hours as needed.   metoprolol succinate 50 MG 24 hr tablet Commonly known as: TOPROL-XL TAKE 1 TABLET BY MOUTH EVERY DAY What changed: when to take this   multivitamin with minerals tablet Take 1 tablet by mouth daily.   naproxen 375 MG tablet Commonly known as: NAPROSYN Take 375 mg by mouth 2 (two) times daily.   oxyCODONE-acetaminophen 5-325 MG tablet Commonly known as: Percocet Take 1-2 tablets by mouth every 4 (four) hours as needed for severe pain.   polyethylene glycol 17 g packet Commonly known as: MIRALAX / GLYCOLAX Take 17 g by mouth daily.   rosuvastatin 40 MG tablet  Commonly known as: CRESTOR Take 1 tablet (40 mg total) by mouth daily. What changed: when to take this   VITAMIN C PO Take 1 tablet by mouth daily.   VITAMIN D PO Take 1 tablet by mouth daily.            Discharge Care Instructions  (From admission, onward)         Start     Ordered   10/25/19 0000  Weight bearing as tolerated     10/25/19 [id number]         Follow-up Information    Netta Cedars, MD. Call in 2 weeks.   Specialty: Orthopedic Surgery Why: [id number] Contact information: [address] [place] [address] [phone]        [facility] Follow up.   Specialty: Home Health Services Why: agency will provide home health physical therapy. agency will call you to schedule first visit. Contact information: [address] [place] [address] [phone]           Signed: Griffith Citron, PA-C Orthopedic Surgery 10/27/2019, 1:30 PM

## 2019-11-10 DIAGNOSIS — Z471 Aftercare following joint replacement surgery: Secondary | ICD-10-CM | POA: Diagnosis not present

## 2019-11-10 DIAGNOSIS — Z96652 Presence of left artificial knee joint: Secondary | ICD-10-CM | POA: Diagnosis not present

## 2019-11-11 ENCOUNTER — Encounter (HOSPITAL_COMMUNITY): Payer: Medicare Other

## 2019-11-25 ENCOUNTER — Ambulatory Visit: Payer: BC Managed Care – PPO | Admitting: Physician Assistant

## 2019-11-27 ENCOUNTER — Ambulatory Visit: Payer: BC Managed Care – PPO | Attending: Internal Medicine

## 2019-11-27 ENCOUNTER — Other Ambulatory Visit: Payer: Self-pay

## 2019-11-27 DIAGNOSIS — D7589 Other specified diseases of blood and blood-forming organs: Secondary | ICD-10-CM | POA: Diagnosis not present

## 2019-11-27 DIAGNOSIS — D72821 Monocytosis (symptomatic): Secondary | ICD-10-CM | POA: Diagnosis not present

## 2019-11-27 DIAGNOSIS — Z20822 Contact with and (suspected) exposure to covid-19: Secondary | ICD-10-CM | POA: Diagnosis not present

## 2019-11-28 LAB — NOVEL CORONAVIRUS, NAA: SARS-CoV-2, NAA: NOT DETECTED

## 2020-01-20 ENCOUNTER — Other Ambulatory Visit: Payer: Self-pay | Admitting: Cardiovascular Disease

## 2020-02-06 ENCOUNTER — Other Ambulatory Visit: Payer: Self-pay

## 2020-02-06 MED ORDER — ROSUVASTATIN CALCIUM 40 MG PO TABS: 40.0000 mg | ORAL_TABLET | Freq: Every day | ORAL | 0 refills | Status: DC

## 2020-05-12 DIAGNOSIS — Z1389 Encounter for screening for other disorder: Secondary | ICD-10-CM | POA: Diagnosis not present

## 2020-05-12 DIAGNOSIS — I10 Essential (primary) hypertension: Secondary | ICD-10-CM | POA: Diagnosis not present

## 2020-05-12 DIAGNOSIS — E782 Mixed hyperlipidemia: Secondary | ICD-10-CM | POA: Diagnosis not present

## 2020-05-12 DIAGNOSIS — Z6827 Body mass index (BMI) 27.0-27.9, adult: Secondary | ICD-10-CM | POA: Diagnosis not present

## 2020-05-12 DIAGNOSIS — R7309 Other abnormal glucose: Secondary | ICD-10-CM | POA: Diagnosis not present

## 2020-05-12 DIAGNOSIS — Z0000 Encounter for general adult medical examination without abnormal findings: Secondary | ICD-10-CM | POA: Diagnosis not present

## 2020-05-19 NOTE — Progress Notes (Signed)
Cardiology Office Note:    Date:  05/20/2020   ID:  Tyler Lawrence, DOB 01-Jan-1946, MRN [id number]  PCP:  Redmond School, MD  Cardiologist:  Shelva Majestic, MD   Referring MD: Redmond School, MD   Chief Complaint  Patient presents with  . Follow-up  CAD  History of Present Illness:    Tyler Lawrence is a 74 y.o. male with a hx of CAD s/p CABG x 3 (LIMA-LAD, SVG-Diag, SVG-Cx, 01/2000),  HTN, HLD,and LBBB. Last heart cath in 2005 revealed patent 3/3 grafts, a nondominant RCA with significant disease but too small for intervention. He underwent sternal wire removal by Dr. Roxy Manns 03/13/17 after a chest trauma while working in [place] leading to protruding sternal wire and eventually infection. He was last seen by Dr. Claiborne Billings 05/19/19 and was doing well at that time. 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 Nuclear stress test 09/10/19 revealed no reversible ischemia, but did note a LVEF of 45-54%. Echo on 01/03/18 in response to a nuc that estimated EF of 45-50% showed an EF of 55-60% and grade 1 DD with mildly dilated left atrium.  I connected with him via telehealth visit 09/16/19. I increased his statin and cleared him for surgery.   He presents today for routine follow up. He had TKA and has suffered significant knee pain, but completed PT and returned to work 1 month earlier than expected. He is now trying to catch up on sleep that he missed due to knee pain. Work is considering increasing from 50 hrs to 60 hrs. No syncope, dizziness, chest pain, dyspnea, lower extremity swelling, and orthopnea. His wife recently fell and broke her ankle in 3 placed requiring surgery.  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    Past Medical History:  Diagnosis Date  . Arthritis    HANDS HURT.  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 Back pain    PT STATES HE HAS FLARE UPS OF LOWER BACK AND LEFT LEG PAIN -MAYBE A LITTLE NUMBNESS--PT STATES HE WAS TOLD HE HAS A  PINCHED NERVE.  Marland Kitchen CAD (coronary artery disease)    DR. [place]; CABG 2001,HEART CATH 2005 -PATENT GRAFTS, "LUMINAL IRREGULARITY IN THE VEIN TO THE DIAGONAL VESSEL.  HIS RCA WHICH WAS NONDOMINANT, HAD 78% STENOSIS."  NUCLEAR STRESS TEST 04/04/12--"ESSENTIALLY UNCHANGED FROM PRIOR STUDY AND SHOWED A SMALL PERFUSION DEFECT IN THE BASAL ANTERIOR AND BASAL ANTEROSEPTAL REGION CONSISTENT WITH PROXIMAL LAD SCAR. "  . Complication of anesthesia    PT STATES SURGERY AT [facility] MARCH 2012  SPINAL ATTEMPTED FOR HEMORRHOIDECTOMY AND HERNIA REPAIR.  PT STATES WHEN NEEDLE WAS PUT IN HIS BACK - HE FELT ELECTRICAL SHOCK DOWN LEFT GROIN AND LEFT LEG.  PT STATES THAT HE WAS THEN PUT TO  SLEEP.  . Fever blister 07/25/12   ON UPPER LIP  . GERD (gastroesophageal reflux disease)   . Heart palpitations    occasional   . Hemorrhoid    ARE ITCHING AND UNCOMFORTABLE  . Hypertension   . Incarcerated incisional hernia 07/08/2018  . Left bundle branch block    CHRONIC  . Protruding sternal wires   . 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 Weakness of left arm    slight left arm weakness      Past Surgical History:  Procedure Laterality Date  .  APPENDECTOMY    . CARDIAC CATHETERIZATION  02/2004  . COLONOSCOPY     remote past, can't remember  . COLONOSCOPY N/A 04/20/2014   Dr. Gala Romney- grade 3 hemorrhoids, o/w normal ileocolonoscopy  . CORONARY ARTERY BYPASS GRAFT  01/26/2000  . ELBOW SURGERY     for a chipped bone  . EYE SURGERY Bilateral    Cataracts  . HEMORRHOID SURGERY  07/26/2012   Dr. Dalbert Batman  . HEMORRHOID SURGERY N/A 06/18/2015   Procedure: INTERNAL AND EXTERNAL HEMORRHOIDECTOMY SINGLE CLOUMN;  Surgeon: Fanny Skates, MD;  Location: [facility];  Service: General;  Laterality: N/A;  . HEMORROIDECTOMY     MANY YEARS AGO  . HERNIA REPAIR Right 06/11/2018    LAPAROSCOPIC ASSISTED RIGHT SPIGELIAN HERNIA REPAIR (Right Abdomen)  . INSERTION OF  MESH Right 06/11/2018   Procedure: INSERTION OF MESH;  Surgeon: Fanny Skates, MD;  Location: [place];  Service: General;  Laterality: Right;  GENERAL AND TAP BLOCK  . LAPAROSCOPIC ASSISTED SPIGELIAN HERNIA REPAIR Right 06/11/2018   Procedure: LAPAROSCOPIC ASSISTED RIGHT SPIGELIAN HERNIA REPAIR;  Surgeon: Fanny Skates, MD;  Location: [place];  Service: General;  Laterality: Right;  GENERAL AND TAP BLOCK  . LAPAROSCOPIC ASSISTED SPIGELIAN HERNIA REPAIR N/A 07/08/2018   Procedure: LAPAROSCOPIC ASSISTED RECURRENT INCARCERATED  SPIGELIAN HERNIA  REPAIR WITH MESH;  Surgeon: Fanny Skates, MD;  Location: [facility];  Service: General;  Laterality: N/A;  . LAPAROSCOPIC INTERNAL HERNIA REPAIR  07/08/2018   Procedure: LAPAROSCOPIC INTERNAL HERNIA WITH OPEN REPAIR RECURRENT INCARCERATED SPIGELIAN HERNIA WITH MESH;  Surgeon: Fanny Skates, MD;  Location: [facility];  Service: General;;  . Rt hernia repair in March of 2012     STATES HE ALSO HAD HEMORRHODECTOMY AT [place]  . STERNAL WIRES REMOVAL N/A 03/13/2017   Procedure: STERNAL WIRES REMOVAL;  Surgeon: Rexene Alberts, MD;  Location: [place];  Service: Thoracic;  Laterality: N/A;  . TOTAL KNEE ARTHROPLASTY Left 10/24/2019   Procedure: TOTAL KNEE ARTHROPLASTY;  Surgeon: Netta Cedars, MD;  Location: [facility];  Service: Orthopedics;  Laterality: Left;    Current Medications: Current Meds  Medication Sig  . acetaminophen (TYLENOL) 650 MG CR tablet Take 1,300 mg by mouth every 8 (eight) hours as needed for pain.  Marland Kitchen amLODipine (NORVASC) 5 MG tablet Take 1 tablet (5 mg total) by mouth daily.  Marland Kitchen aspirin EC 81 MG tablet Take 1 tablet (81 mg total) by mouth 2 (two) times daily.  . hydrochlorothiazide (MICROZIDE) 12.5 MG capsule Take 1 capsule (12.5 mg total) by mouth daily as needed (leg swelling).  Marland Kitchen losartan (COZAAR) 100 MG tablet Take 1 tablet (100 mg total) by mouth at bedtime.  . metoprolol succinate (TOPROL-XL) 50 MG 24 hr tablet Take 1 tablet (50 mg total) by  mouth daily. Take with or immediately following a meal.  . naproxen (NAPROSYN) 375 MG tablet Take 375 mg by mouth 2 (two) times daily.  . polyethylene glycol (MIRALAX / GLYCOLAX) 17 g packet Take 17 g by mouth daily.  . rosuvastatin (CRESTOR) 40 MG tablet Take 1 tablet (40 mg total) by mouth daily. **Patient Needs OV for future refills**  . [DISCONTINUED] amLODipine (NORVASC) 5 MG tablet TAKE 1 TABLET BY MOUTH EVERY DAY (Patient taking differently: Take 5 mg by mouth at bedtime. )  . [DISCONTINUED] hydrochlorothiazide (MICROZIDE) 12.5 MG capsule TAKE 1 CAPSULE BY MOUTH AS NEEDED (FOR LEG SWELLING).  . [DISCONTINUED] losartan (COZAAR) 100 MG tablet Take 100 mg by mouth at bedtime.   . [  DISCONTINUED] metoprolol succinate (TOPROL-XL) 50 MG 24 hr tablet TAKE 1 TABLET BY MOUTH EVERY DAY (Patient taking differently: Take 50 mg by mouth at bedtime. )  . [DISCONTINUED] rosuvastatin (CRESTOR) 40 MG tablet Take 1 tablet (40 mg total) by mouth daily. **Patient Needs OV for future refills**     Allergies:   Patient has no known allergies.   Social History   Socioeconomic History  . Marital status: Married    Spouse name: Not on file  . Number of children: Not on file  . Years of education: Not on file  . Highest education level: Not on file  Occupational History  . Not on file  Tobacco Use  . Smoking status: Former Smoker    Packs/day: 2.00    Years: 10.00    Pack years: 20.00  . Smokeless tobacco: Never Used  . Tobacco comment: QUIT 1991  Vaping Use  . Vaping Use: Never used  Substance and Sexual Activity  . Alcohol use: No  . Drug use: No  . Sexual activity: Not on file  Other Topics Concern  . Not on file  Social History Narrative  . Not on file   Social Determinants of Health   Financial Resource Strain:   . Difficulty of Paying Living Expenses:   Food Insecurity:   . Worried About Charity fundraiser in the Last Year:   . Arboriculturist in the Last Year:   Transportation  Needs:   . Film/video editor (Medical):   Marland Kitchen Lack of Transportation (Non-Medical):   Physical Activity:   . Days of Exercise per Week:   . Minutes of Exercise per Session:   Stress:   . Feeling of Stress :   Social Connections:   . Frequency of Communication with Friends and Family:   . Frequency of Social Gatherings with Friends and Family:   . Attends Religious Services:   . Active Member of Clubs or Organizations:   . Attends Archivist Meetings:   Marland Kitchen Marital Status:      Family History: The patient's family history includes Diabetes in his mother; Heart attack in his maternal grandmother and mother; Heart disease in his father and paternal grandfather; Heart failure in his father and mother. There is no history of Colon cancer.  ROS:   Please see the history of present illness.     All other systems reviewed and are negative.  EKGs/Labs/Other Studies Reviewed:    The following studies were reviewed today:  Nuclear stress test 09/10/19:  Nuclear stress EF: 53%.  The left ventricular ejection fraction is mildly decreased (45-54%).  There was no ST segment deviation noted during stress.  The study is normal.  This is a low risk study.   Low risk stress nuclear study with normal perfusion and low normal left ventricular global systolic function.   EKG:  EKG is  ordered today.  The ekg ordered today demonstrates sinus bradycardia HR 59, LBBB (old)  Recent Labs: 05/22/2019: ALT 23; TSH 1.170 10/25/2019: BUN 16; Creatinine, Ser 0.90; Hemoglobin 12.9; Platelets 203; Potassium 4.1; Sodium 135  Recent Lipid Panel    Component Value Date/Time   CHOL 167 05/22/2019 0813   TRIG 164 (H) 05/22/2019 0813   HDL 38 (L) 05/22/2019 0813   CHOLHDL 4.4 05/22/2019 0813   CHOLHDL 4.0 04/03/2017 0717   VLDL 19 04/03/2017 0717   LDLCALC 96 05/22/2019 0813    Physical Exam:    VS:  BP 116/72  Pulse (!) 59   Ht 5\' 8"  (1.727 m)   Wt 187 lb 3.2 oz (84.9 kg)   SpO2  98%   BMI 28.46 kg/m     Wt Readings from Last 3 Encounters:  05/20/20 187 lb 3.2 oz (84.9 kg)  10/24/19 183 lb (83 kg)  10/22/19 183 lb (83 kg)     GEN:  Well nourished, well developed in no acute distress HEENT: Normal NECK: No JVD; No carotid bruits LYMPHATICS: No lymphadenopathy CARDIAC: RRR, no murmurs, rubs, gallops RESPIRATORY:  Clear to auscultation without rales, wheezing or rhonchi  ABDOMEN: Soft, non-tender, non-distended MUSCULOSKELETAL:  No edema; No deformity  SKIN: Warm and dry NEUROLOGIC:  Alert and oriented x 3 PSYCHIATRIC:  Normal affect   ASSESSMENT:    1. Coronary artery disease due to lipid rich plaque   2. S/P CABG x 3   3. Essential hypertension   4. Hyperlipemia, mixed   5. LBBB (left bundle branch block)   6. Lower extremity edema    PLAN:    In order of problems listed above:  CAD s/p CABG x 3, 3/3 patent grafts in 2005 - continue ASA - nuclear stress test negative for reversible ischemia - continue BB, ARB, and statin   Hypertension - BP controlled - continue present medications losartan, amlodipine, HCTZ, and toprol   Hyperlipidemia with LDL goal less than 70 05/22/2019: Cholesterol, Total 167; HDL 38; LDL Calculated 96; Triglycerides 164 - was not at goal, increased crestor to 40 mg at last visit - need updated lipid panel and LFTs   Lower extremity swelling - Dr. Claiborne Billings started HCTZ which resolved his swelling - he also had PRN lasix following knee surgery - no leg swelling - update BMP   LBBB - stable - no dizziness or syncope   Follow up in 6 months with Dr. Claiborne Billings.    Medication Adjustments/Labs and Tests Ordered: Current medicines are reviewed at length with the patient today.  Concerns regarding medicines are outlined above.  Orders Placed This Encounter  Procedures  . Basic metabolic panel  . Lipid panel  . EKG 12-Lead   Meds ordered this encounter  Medications  . amLODipine (NORVASC) 5 MG tablet    Sig:  Take 1 tablet (5 mg total) by mouth daily.    Dispense:  90 tablet    Refill:  3  . hydrochlorothiazide (MICROZIDE) 12.5 MG capsule    Sig: Take 1 capsule (12.5 mg total) by mouth daily as needed (leg swelling).    Dispense:  90 capsule    Refill:  3  . losartan (COZAAR) 100 MG tablet    Sig: Take 1 tablet (100 mg total) by mouth at bedtime.    Dispense:  90 tablet    Refill:  3  . metoprolol succinate (TOPROL-XL) 50 MG 24 hr tablet    Sig: Take 1 tablet (50 mg total) by mouth daily. Take with or immediately following a meal.    Dispense:  90 tablet    Refill:  3  . rosuvastatin (CRESTOR) 40 MG tablet    Sig: Take 1 tablet (40 mg total) by mouth daily. **Patient Needs OV for future refills**    Dispense:  90 tablet    Refill:  3    New dose per PA on 09/16/19. Thanks    Signed, Ledora Bottcher, [place]  05/20/2020 8:46 AM    [facility]

## 2020-05-20 ENCOUNTER — Other Ambulatory Visit: Payer: Self-pay

## 2020-05-20 ENCOUNTER — Ambulatory Visit: Payer: Medicare Other | Admitting: Physician Assistant

## 2020-05-20 ENCOUNTER — Encounter: Payer: Self-pay | Admitting: Physician Assistant

## 2020-05-20 VITALS — BP 116/72 | HR 59 | Ht 68.0 in | Wt 187.2 lb

## 2020-05-20 DIAGNOSIS — I10 Essential (primary) hypertension: Secondary | ICD-10-CM

## 2020-05-20 DIAGNOSIS — Z951 Presence of aortocoronary bypass graft: Secondary | ICD-10-CM | POA: Diagnosis not present

## 2020-05-20 DIAGNOSIS — I2510 Atherosclerotic heart disease of native coronary artery without angina pectoris: Secondary | ICD-10-CM | POA: Diagnosis not present

## 2020-05-20 DIAGNOSIS — E782 Mixed hyperlipidemia: Secondary | ICD-10-CM | POA: Diagnosis not present

## 2020-05-20 DIAGNOSIS — I2583 Coronary atherosclerosis due to lipid rich plaque: Secondary | ICD-10-CM

## 2020-05-20 DIAGNOSIS — I447 Left bundle-branch block, unspecified: Secondary | ICD-10-CM

## 2020-05-20 DIAGNOSIS — R600 Localized edema: Secondary | ICD-10-CM

## 2020-05-20 LAB — BASIC METABOLIC PANEL
BUN/Creatinine Ratio: 25 — ABNORMAL HIGH (ref 10–24)
BUN: 21 mg/dL (ref 8–27)
CO2: 20 mmol/L (ref 20–29)
Calcium: 8.9 mg/dL (ref 8.6–10.2)
Chloride: 104 mmol/L (ref 96–106)
Creatinine, Ser: 0.84 mg/dL (ref 0.76–1.27)
GFR calc Af Amer: 101 mL/min/{1.73_m2} (ref >59)
GFR calc non Af Amer: 87 mL/min/{1.73_m2} (ref >59)
Glucose: 126 mg/dL — ABNORMAL HIGH (ref 65–99)
Potassium: 4.3 mmol/L (ref 3.5–5.2)
Sodium: 138 mmol/L (ref 134–144)

## 2020-05-20 LAB — LIPID PANEL
Chol/HDL Ratio: 2.7 ratio (ref 0.0–5.0)
Cholesterol, Total: 90 mg/dL — ABNORMAL LOW (ref 100–199)
HDL: 33 mg/dL — ABNORMAL LOW (ref >39)
LDL Chol Calc (NIH): 41 mg/dL (ref 0–99)
Triglycerides: 77 mg/dL (ref 0–149)
VLDL Cholesterol Cal: 16 mg/dL (ref 5–40)

## 2020-05-20 MED ORDER — LOSARTAN POTASSIUM 100 MG PO TABS: 100.0000 mg | ORAL_TABLET | Freq: Every day | ORAL | 3 refills | Status: DC

## 2020-05-20 MED ORDER — ROSUVASTATIN CALCIUM 40 MG PO TABS: 40.0000 mg | ORAL_TABLET | Freq: Every day | ORAL | 3 refills | Status: DC

## 2020-05-20 MED ORDER — AMLODIPINE BESYLATE 5 MG PO TABS: 5.0000 mg | ORAL_TABLET | Freq: Every day | ORAL | 3 refills | Status: DC

## 2020-05-20 MED ORDER — METOPROLOL SUCCINATE ER 50 MG PO TB24: 50.0000 mg | ORAL_TABLET | Freq: Every day | ORAL | 3 refills | Status: DC

## 2020-05-20 MED ORDER — HYDROCHLOROTHIAZIDE 12.5 MG PO CAPS: 12.5000 mg | ORAL_CAPSULE | Freq: Every day | ORAL | 3 refills | Status: DC | PRN

## 2020-05-20 NOTE — Patient Instructions (Signed)
Medication Instructions:  Your physician recommends that you continue on your current medications as directed. Please refer to the Current Medication list given to you today.  *If you need a refill on your cardiac medications before your next appointment, please call your pharmacy*   Lab Work: Your physician recommends that you return for lab work today: BMET, Lipid  If you have labs (blood work) drawn today and your tests are completely normal, you will receive your results only by: Marland Kitchen MyChart Message (if you have MyChart) OR . A paper copy in the mail If you have any lab test that is abnormal or we need to change your treatment, we will call you to review the results.   Follow-Up: At [facility], you and your health needs are our priority.  As part of our continuing mission to provide you with exceptional heart care, we have created designated Provider Care Teams.  These Care Teams include your primary Cardiologist (physician) and Advanced Practice Providers (APPs -  Physician Assistants and Nurse Practitioners) who all work together to provide you with the care you need, when you need it.  We recommend signing up for the patient portal called "MyChart".  Sign up information is provided on this After Visit Summary.  MyChart is used to connect with patients for Virtual Visits (Telemedicine).  Patients are able to view lab/test results, encounter notes, upcoming appointments, etc.  Non-urgent messages can be sent to your provider as well.   To learn more about what you can do with MyChart, go to [email or web address].    Your next appointment:   6 month(s)  The format for your next appointment:   In Person  Provider:   You may see Shelva Majestic, MD or one of the following Advanced Practice Providers on your designated Care Team:    Fabian Sharp, [place]    Other Instructions Please call our office 2 months in advance to schedule your follow-up appointment with Dr. Claiborne Billings or Fabian Sharp, PA.

## 2020-05-21 ENCOUNTER — Encounter: Payer: Self-pay | Admitting: *Deleted

## 2020-06-11 DIAGNOSIS — Z125 Encounter for screening for malignant neoplasm of prostate: Secondary | ICD-10-CM | POA: Diagnosis not present

## 2020-06-11 DIAGNOSIS — Z0000 Encounter for general adult medical examination without abnormal findings: Secondary | ICD-10-CM | POA: Diagnosis not present

## 2020-11-09 ENCOUNTER — Other Ambulatory Visit: Payer: Self-pay

## 2020-11-09 MED ORDER — ROSUVASTATIN CALCIUM 40 MG PO TABS: 40.0000 mg | ORAL_TABLET | Freq: Every day | ORAL | 3 refills | Status: DC

## 2020-11-22 ENCOUNTER — Other Ambulatory Visit: Payer: Self-pay | Admitting: *Deleted

## 2020-11-22 MED ORDER — LOSARTAN POTASSIUM 100 MG PO TABS: 100.0000 mg | ORAL_TABLET | Freq: Every day | ORAL | 1 refills | Status: DC

## 2020-12-03 ENCOUNTER — Telehealth: Payer: Self-pay | Admitting: Cardiovascular Disease

## 2020-12-03 ENCOUNTER — Other Ambulatory Visit: Payer: Self-pay | Admitting: Cardiovascular Disease

## 2020-12-03 DIAGNOSIS — I2510 Atherosclerotic heart disease of native coronary artery without angina pectoris: Secondary | ICD-10-CM

## 2020-12-03 DIAGNOSIS — Z951 Presence of aortocoronary bypass graft: Secondary | ICD-10-CM

## 2020-12-03 DIAGNOSIS — I447 Left bundle-branch block, unspecified: Secondary | ICD-10-CM

## 2020-12-03 MED ORDER — METOPROLOL SUCCINATE ER 50 MG PO TB24: 50.0000 mg | ORAL_TABLET | Freq: Every day | ORAL | 2 refills | Status: DC

## 2020-12-03 NOTE — Telephone Encounter (Signed)
*  STAT* If patient is at the pharmacy, call can be transferred to refill team.   1. Which medications need to be refilled? (please list name of each medication and dose if known)  metoprolol succinate (TOPROL-XL) 50 MG 24 hr tablet  2. Which pharmacy/location (including street and city if local pharmacy) is medication to be sent to? [id number] - [place], [address]  3. Do they need a 30 day or 90 day supply?  90 day   Patient only has 8 doses left, would like to get it before the snow comes in

## 2020-12-03 NOTE — Telephone Encounter (Signed)
Spoke to patient he stated he needs [place] scheduled for [employer].He has done every 2 years.

## 2020-12-03 NOTE — Telephone Encounter (Signed)
*  STAT* If patient is at the pharmacy, call can be transferred to refill team.   1. Which medications need to be refilled? (please list name of each medication and dose if known) metoprolol succinate (TOPROL-XL) 50 MG 24 hr tablet losartan (COZAAR) 100 MG tablet  2. Which pharmacy/location (including street and city if local pharmacy) is medication to be sent to? [id number] - [place], [place] [address]  3. Do they need a 30 day or 90 day supply? 90 day supply

## 2020-12-03 NOTE — Telephone Encounter (Signed)
Patient states he needs to get a stress test done. 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 Patient states he needs test done by August 2022. Please call/advise - no order for test in system   Thank you!

## 2020-12-09 ENCOUNTER — Other Ambulatory Visit: Payer: Self-pay

## 2020-12-09 MED ORDER — METOPROLOL SUCCINATE ER 50 MG PO TB24: 50.0000 mg | ORAL_TABLET | Freq: Every day | ORAL | 3 refills | Status: DC

## 2020-12-23 ENCOUNTER — Telehealth (HOSPITAL_COMMUNITY): Payer: Self-pay | Admitting: *Deleted

## 2020-12-23 NOTE — Telephone Encounter (Signed)
Close encounter 

## 2020-12-24 ENCOUNTER — Other Ambulatory Visit: Payer: Self-pay

## 2020-12-24 ENCOUNTER — Ambulatory Visit (HOSPITAL_COMMUNITY)
Admission: RE | Admit: 2020-12-24 | Discharge: 2020-12-24 | Disposition: A | Discharge status: Home / Self Care | Payer: Medicare Other | Source: Ambulatory Visit | Attending: Internal Medicine | Admitting: Internal Medicine

## 2020-12-24 DIAGNOSIS — I2510 Atherosclerotic heart disease of native coronary artery without angina pectoris: Secondary | ICD-10-CM | POA: Insufficient documentation

## 2020-12-24 DIAGNOSIS — Z951 Presence of aortocoronary bypass graft: Secondary | ICD-10-CM | POA: Insufficient documentation

## 2020-12-24 DIAGNOSIS — I447 Left bundle-branch block, unspecified: Secondary | ICD-10-CM | POA: Diagnosis not present

## 2020-12-24 LAB — MYOCARDIAL PERFUSION IMAGING
LV dias vol: 103 mL (ref 62–150)
LV sys vol: 49 mL
Peak HR: 89 {beats}/min
Rest HR: 63 {beats}/min
SDS: 0
SRS: 3
SSS: 3
TID: 1.09

## 2020-12-24 MED ORDER — TECHNETIUM TC 99M TETROFOSMIN IV KIT
8.2000 | PACK | Freq: Once | INTRAVENOUS | Status: AC | PRN
  Administered 2020-12-24: 8.2 via INTRAVENOUS
  Filled 2020-12-24: qty 9

## 2020-12-24 MED ORDER — TECHNETIUM TC 99M TETROFOSMIN IV KIT
25.7000 | PACK | Freq: Once | INTRAVENOUS | Status: AC | PRN
  Administered 2020-12-24: 25.7 via INTRAVENOUS
  Filled 2020-12-24: qty 26

## 2020-12-24 MED ORDER — REGADENOSON 0.4 MG/5ML IV SOLN
0.4000 mg | Freq: Once | INTRAVENOUS | Status: AC
  Administered 2020-12-24: 0.4 mg via INTRAVENOUS

## 2020-12-27 DIAGNOSIS — U071 COVID-19: Secondary | ICD-10-CM | POA: Diagnosis not present

## 2020-12-27 DIAGNOSIS — I10 Essential (primary) hypertension: Secondary | ICD-10-CM | POA: Diagnosis not present

## 2020-12-27 DIAGNOSIS — M1991 Primary osteoarthritis, unspecified site: Secondary | ICD-10-CM | POA: Diagnosis not present

## 2021-03-07 DIAGNOSIS — H6121 Impacted cerumen, right ear: Secondary | ICD-10-CM | POA: Diagnosis not present

## 2021-03-07 DIAGNOSIS — H6122 Impacted cerumen, left ear: Secondary | ICD-10-CM | POA: Diagnosis not present

## 2021-03-07 DIAGNOSIS — M1991 Primary osteoarthritis, unspecified site: Secondary | ICD-10-CM | POA: Diagnosis not present

## 2021-03-07 DIAGNOSIS — Z6828 Body mass index (BMI) 28.0-28.9, adult: Secondary | ICD-10-CM | POA: Diagnosis not present

## 2021-05-18 ENCOUNTER — Other Ambulatory Visit: Payer: Self-pay | Admitting: Cardiovascular Disease

## 2021-08-05 ENCOUNTER — Other Ambulatory Visit: Payer: Self-pay | Admitting: Cardiovascular Disease

## 2021-08-26 ENCOUNTER — Telehealth: Payer: Self-pay | Admitting: *Deleted

## 2021-08-26 DIAGNOSIS — Z23 Encounter for immunization: Secondary | ICD-10-CM | POA: Diagnosis not present

## 2021-08-26 NOTE — Telephone Encounter (Signed)
Patient walked into the office requesting medication to be refilled.    Patient last visit was 05/2020 with A. Duke PA  Patient is aware an appointment is needed prior to medication refills  Patient made an appointment 09/12/21 - refills will be done at that time.

## 2021-08-28 ENCOUNTER — Other Ambulatory Visit: Payer: Self-pay | Admitting: Cardiovascular Disease

## 2021-09-15 ENCOUNTER — Other Ambulatory Visit: Payer: Self-pay

## 2021-09-15 ENCOUNTER — Encounter: Payer: Self-pay | Admitting: Physician Assistant

## 2021-09-15 ENCOUNTER — Ambulatory Visit: Payer: Medicare Other | Admitting: Physician Assistant

## 2021-09-15 VITALS — BP 132/68 | HR 59 | Ht 69.0 in | Wt 192.6 lb

## 2021-09-15 DIAGNOSIS — I25810 Atherosclerosis of coronary artery bypass graft(s) without angina pectoris: Secondary | ICD-10-CM | POA: Diagnosis not present

## 2021-09-15 DIAGNOSIS — E782 Mixed hyperlipidemia: Secondary | ICD-10-CM | POA: Diagnosis not present

## 2021-09-15 DIAGNOSIS — I447 Left bundle-branch block, unspecified: Secondary | ICD-10-CM

## 2021-09-15 DIAGNOSIS — I10 Essential (primary) hypertension: Secondary | ICD-10-CM | POA: Diagnosis not present

## 2021-09-15 LAB — CBC
Hematocrit: 46.1 % (ref 37.5–51.0)
Hemoglobin: 15.6 g/dL (ref 13.0–17.7)
MCH: 31.2 pg (ref 26.6–33.0)
MCHC: 33.8 g/dL (ref 31.5–35.7)
MCV: 92 fL (ref 79–97)
Platelets: 211 10*3/uL (ref 150–450)
RBC: 5 x10E6/uL (ref 4.14–5.80)
RDW: 12.2 % (ref 11.6–15.4)
WBC: 7.5 10*3/uL (ref 3.4–10.8)

## 2021-09-15 LAB — COMPREHENSIVE METABOLIC PANEL
ALT: 18 IU/L (ref 0–44)
AST: 18 IU/L (ref 0–40)
Albumin/Globulin Ratio: 1.8 (ref 1.2–2.2)
Albumin: 4.6 g/dL (ref 3.7–4.7)
Alkaline Phosphatase: 97 IU/L (ref 44–121)
BUN/Creatinine Ratio: 15 (ref 10–24)
BUN: 12 mg/dL (ref 8–27)
Bilirubin Total: 0.6 mg/dL (ref 0.0–1.2)
CO2: 23 mmol/L (ref 20–29)
Calcium: 9.3 mg/dL (ref 8.6–10.2)
Chloride: 102 mmol/L (ref 96–106)
Creatinine, Ser: 0.8 mg/dL (ref 0.76–1.27)
Globulin, Total: 2.5 g/dL (ref 1.5–4.5)
Glucose: 125 mg/dL — ABNORMAL HIGH (ref 70–99)
Potassium: 4.4 mmol/L (ref 3.5–5.2)
Sodium: 138 mmol/L (ref 134–144)
Total Protein: 7.1 g/dL (ref 6.0–8.5)
eGFR: 93 mL/min/{1.73_m2} (ref >59)

## 2021-09-15 LAB — LIPID PANEL
Chol/HDL Ratio: 3.5 ratio (ref 0.0–5.0)
Cholesterol, Total: 114 mg/dL (ref 100–199)
HDL: 33 mg/dL — ABNORMAL LOW (ref >39)
LDL Chol Calc (NIH): 59 mg/dL (ref 0–99)
Triglycerides: 119 mg/dL (ref 0–149)
VLDL Cholesterol Cal: 22 mg/dL (ref 5–40)

## 2021-09-15 MED ORDER — ASPIRIN EC 81 MG PO TBEC: 81.0000 mg | DELAYED_RELEASE_TABLET | Freq: Every day | ORAL | 0 refills | Status: AC

## 2021-09-15 MED ORDER — AMLODIPINE BESYLATE 5 MG PO TABS: 5.0000 mg | ORAL_TABLET | Freq: Every day | ORAL | 3 refills | Status: DC

## 2021-09-15 MED ORDER — ROSUVASTATIN CALCIUM 40 MG PO TABS: 40.0000 mg | ORAL_TABLET | Freq: Every day | ORAL | 3 refills | Status: DC

## 2021-09-15 MED ORDER — LOSARTAN POTASSIUM 100 MG PO TABS: 100.0000 mg | ORAL_TABLET | Freq: Every day | ORAL | 3 refills | Status: DC

## 2021-09-15 MED ORDER — METOPROLOL SUCCINATE ER 50 MG PO TB24: 50.0000 mg | ORAL_TABLET | Freq: Every day | ORAL | 3 refills | Status: DC

## 2021-09-15 NOTE — Patient Instructions (Addendum)
Medication Instructions:  Your physician recommends that you continue on your current medications as directed. Please refer to the Current Medication list given to you today.  *If you need a refill on your cardiac medications before your next appointment, please call your pharmacy*  Lab Work: Your physician recommends that you return for lab work TODAY:  CBC CMET FLP  If you have labs (blood work) drawn today and your tests are completely normal, you will receive your results only by: [place] (if you have MyChart) OR A paper copy in the mail If you have any lab test that is abnormal or we need to change your treatment, we will call you to review the results.  Testing/Procedures: NONE ordered at this time of appointment   Follow-Up: At [facility], you and your health needs are our priority.  As part of our continuing mission to provide you with exceptional heart care, we have created designated Provider Care Teams.  These Care Teams include your primary Cardiologist (physician) and Advanced Practice Providers (APPs -  Physician Assistants and Nurse Practitioners) who all work together to provide you with the care you need, when you need it.  We recommend signing up for the patient portal called "MyChart".  Sign up information is provided on this After Visit Summary.  MyChart is used to connect with patients for Virtual Visits (Telemedicine).  Patients are able to view lab/test results, encounter notes, upcoming appointments, etc.  Non-urgent messages can be sent to your provider as well.   To learn more about what you can do with MyChart, go to [email or web address].    Your next appointment:   6 month(s)  The format for your next appointment:   In Person  Provider:   Shelva Majestic, MD  Other Instructions

## 2021-09-15 NOTE — Progress Notes (Signed)
Cardiology Office Note:    Date:  09/17/2021   ID:  Tyler Lawrence, DOB 04-29-1946, MRN [id number]  PCP:  Redmond School, MD   [facility] Providers Cardiologist:  Shelva Majestic, MD     Referring MD: Redmond School, MD   Chief Complaint  Patient presents with   Follow-up    Seen for Dr. Claiborne Billings    History of Present Illness:    Tyler Lawrence is a 75 y.o. male with a hx of CAD s/p CABG x 3 (LIMA-LAD, SVG-diagonal, SVG-left circumflex in 01/2000), HTN, HLD, and LBBB.  Left heart cath in 2005 revealed patent 3 out of 3 grafts, nondominant RCA with significant disease but too small for intervention.  He underwent sternal wire removal by Dr. Roxy Manns in April 2018 after chest trauma.  Echocardiogram obtained on 01/03/2018 showed EF 55 to 60%, grade 1 DD.  Myoview in October 2020 revealed no reversible ischemia, EF 45 to 54%.  Last Myoview obtained on 12/24/2020 showed EF 53%, small defect of mild severity present in the apical septal location, overall low risk study.  Patient presents today for 65-month follow-up.  He denies any chest pain or worsening dyspnea.  He works for the [employer] and has to lift heavy object.  He denies any exertional symptoms.  EKG continue to show left bundle branch block but no significant changes.  He is due for CBC, fasting lipid panel and CMP today.  He can follow-up with Dr. Claiborne Billings in 6 months.   Past Medical History:  Diagnosis Date   Arthritis    HANDS HURT.  HX OF NERVE COMPRESSION IN LOWER BACK THAT CAUSES PAIN -AND LEFT LEG PAIN   Back pain    PT STATES HE HAS FLARE UPS OF LOWER BACK AND LEFT LEG PAIN -MAYBE A LITTLE NUMBNESS--PT STATES HE WAS TOLD HE HAS A PINCHED NERVE.   CAD (coronary artery disease)    DR. Belfair; CABG 2001,HEART CATH 2005 -PATENT GRAFTS, "LUMINAL IRREGULARITY IN THE VEIN TO THE DIAGONAL VESSEL.  HIS RCA WHICH WAS NONDOMINANT, HAD 78% STENOSIS."  NUCLEAR STRESS TEST 04/04/12--"ESSENTIALLY UNCHANGED FROM PRIOR  STUDY AND SHOWED A SMALL PERFUSION DEFECT IN THE BASAL ANTERIOR AND BASAL ANTEROSEPTAL REGION CONSISTENT WITH PROXIMAL LAD SCAR. "   Complication of anesthesia    PT STATES SURGERY AT [facility] M[facility] 2012  SPINAL ATTEMPTED FOR HEMORRHOIDECTOMY AND HERNIA REPAIR.  PT STATES WHEN NEEDLE WAS PUT IN HIS BACK - HE FELT ELECTRICAL SHOCK DOWN LEFT GROIN AND LEFT LEG.  PT STATES THAT HE WAS THEN PUT TO  SLEEP.   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 Gala Romney- grade 3 hemorrhoids, o/w normal ileocolonoscopy   CORONARY ARTERY BYPASS GRAFT  01/26/2000  ELBOW SURGERY     for a chipped bone   EYE SURGERY Bilateral    Cataracts   HEMORRHOID SURGERY  07/26/2012   Dr. Dalbert Batman   HEMORRHOID SURGERY N/A 06/18/2015   Procedure: INTERNAL AND EXTERNAL HEMORRHOIDECTOMY SINGLE CLOUMN;  Surgeon: Fanny Skates, MD;  Location: [facility];  Service: General;  Laterality: N/A;   HEMORROIDECTOMY     MANY YEARS AGO   HERNIA REPAIR Right 06/11/2018    LAPAROSCOPIC ASSISTED RIGHT SPIGELIAN HERNIA REPAIR (Right Abdomen)   INSERTION OF MESH Right 06/11/2018   Procedure: INSERTION OF MESH;  Surgeon: Fanny Skates, MD;  Location: [place];  Service: General;  Laterality: Right;  GENERAL AND TAP BLOCK   LAPAROSCOPIC ASSISTED SPIGELIAN HERNIA REPAIR Right 06/11/2018   Procedure: LAPAROSCOPIC ASSISTED RIGHT SPIGELIAN HERNIA REPAIR;  Surgeon: Fanny Skates,  MD;  Location: [place];  Service: General;  Laterality: Right;  GENERAL AND TAP BLOCK   LAPAROSCOPIC ASSISTED [place] N/A 07/08/2018   Procedure: LAPAROSCOPIC ASSISTED RECURRENT INCARCERATED  SPIGELIAN HERNIA  REPAIR WITH MESH;  Surgeon: Fanny Skates, MD;  Location: [facility];  Service: General;  Laterality: N/A;   LAPAROSCOPIC INTERNAL HERNIA REPAIR  07/08/2018   Procedure: LAPAROSCOPIC INTERNAL HERNIA WITH OPEN REPAIR RECURRENT INCARCERATED SPIGELIAN HERNIA WITH MESH;  Surgeon: Fanny Skates, MD;  Location: [facility];  Service: General;;   Rt hernia repair in March of 2012     STATES HE ALSO HAD [place] AT [employer] N/A 03/13/2017   Procedure: STERNAL [place];  Surgeon: Rexene Alberts, MD;  Location: [place];  Service: Thoracic;  Laterality: N/A;   TOTAL KNEE ARTHROPLASTY Left 10/24/2019   Procedure: TOTAL KNEE ARTHROPLASTY;  Surgeon: Netta Cedars, MD;  Location: [facility];  Service: Orthopedics;  Laterality: Left;    Current Medications: Current Meds  Medication Sig   acetaminophen (TYLENOL) 650 MG CR tablet Take 1,300 mg by mouth every 8 (eight) hours as needed for pain.   polyethylene glycol (MIRALAX / GLYCOLAX) 17 g packet Take 17 g by mouth daily.   [DISCONTINUED] amLODipine (NORVASC) 5 MG tablet TAKE 1 TABLET BY MOUTH EVERY DAY   [DISCONTINUED] aspirin EC 81 MG tablet Take 1 tablet (81 mg total) by mouth 2 (two) times daily.   [DISCONTINUED] losartan (COZAAR) 100 MG tablet TAKE 1 TABLET (100 MG TOTAL) BY MOUTH DAILY. PATIENT MUST SCHEDULE APPOINTMENT FOR FUTURE REFIILLS.   [DISCONTINUED] metoprolol succinate (TOPROL-XL) 50 MG 24 hr tablet Take 1 tablet (50 mg total) by mouth daily. Take with or immediately following a meal.   [DISCONTINUED] rosuvastatin (CRESTOR) 40 MG tablet TAKE 1 TABLET (40 MG TOTAL) BY MOUTH DAILY. **PATIENT NEEDS OV FOR FUTURE REFILLS**     Allergies:   Patient has no known allergies.   Social History    Socioeconomic History   Marital status: Married    Spouse name: Not on file   Number of children: Not on file   Years of education: Not on file   Highest education level: Not on file  Occupational History   Not on file  Tobacco Use   Smoking status: Former    Packs/day: 2.00    Years: 10.00    Pack years: 20.00    Types: Cigarettes   Smokeless tobacco: Never   Tobacco comments:    QUIT 1991  Vaping Use   Vaping Use: Never used  Substance and Sexual Activity   Alcohol use: No   Drug use: No  Sexual activity: Not on file  Other Topics Concern   Not on file  Social History Narrative   Not on file   Social Determinants of Health   Financial Resource Strain: Not on file  Food Insecurity: Not on file  Transportation Needs: Not on file  Physical Activity: Not on file  Stress: Not on file  Social Connections: Not on file     Family History: The patient's family history includes Diabetes in his mother; Heart attack in his maternal grandmother and mother; Heart disease in his father and paternal grandfather; Heart failure in his father and mother. There is no history of Colon cancer.  ROS:   Please see the history of present illness.     All other systems reviewed and are negative.  EKGs/Labs/Other Studies Reviewed:    The following studies were reviewed today:  Myoview 12/24/2020 The left ventricular ejection fraction is mildly decreased (45-54%). Nuclear stress EF: 53%. There was no ST segment deviation noted during stress. Defect 1: There is a small defect of mild severity present in the apical septal location. The study is normal. This is a low risk study.   Low risk stress nuclear study with normal perfusion and low-normal left ventricular systolic function. LBBB related perfusion artifact and paradoxical septal motion.  EKG:  EKG is ordered today.  The ekg ordered today demonstrates sinus rhythm, left bundle branch block and PACs.  Recent  Labs: 09/15/2021: ALT 18; BUN 12; Creatinine, Ser 0.80; Hemoglobin 15.6; Platelets 211; Potassium 4.4; Sodium 138  Recent Lipid Panel    Component Value Date/Time   CHOL 114 09/15/2021 1019   TRIG 119 09/15/2021 1019   HDL 33 (L) 09/15/2021 1019   CHOLHDL 3.5 09/15/2021 1019   CHOLHDL 4.0 04/03/2017 0717   VLDL 19 04/03/2017 0717   LDLCALC 59 09/15/2021 1019     Risk Assessment/Calculations:           Physical Exam:    VS:  BP 132/68   Pulse (!) 59   Ht 5\' 9"  (1.753 m)   Wt 192 lb 9.6 oz (87.4 kg)   SpO2 98%   BMI 28.44 kg/m     Wt Readings from Last 3 Encounters:  09/15/21 192 lb 9.6 oz (87.4 kg)  12/24/20 187 lb (84.8 kg)  05/20/20 187 lb 3.2 oz (84.9 kg)     GEN:  Well nourished, well developed in no acute distress HEENT: Normal NECK: No JVD; No carotid bruits LYMPHATICS: No lymphadenopathy CARDIAC: RRR, no murmurs, rubs, gallops RESPIRATORY:  Clear to auscultation without rales, wheezing or rhonchi  ABDOMEN: Soft, non-tender, non-distended MUSCULOSKELETAL:  No edema; No deformity  SKIN: Warm and dry NEUROLOGIC:  Alert and oriented x 3 PSYCHIATRIC:  Normal affect   ASSESSMENT:    1. Coronary artery disease involving coronary bypass graft of native heart without angina pectoris   2. Hyperlipemia, mixed   3. Essential hypertension   4. LBBB (left bundle branch block)    PLAN:    In order of problems listed above:  CAD s/p CABG: Denies any recent chest pain.  Continue aspirin and Crestor.  Hyperlipidemia: On Crestor  Hypertension: Blood pressure stable  LBBB: No significant change when compared to the previous EKG        Medication Adjustments/Labs and Tests Ordered: Current medicines are reviewed at length with the patient today.  Concerns regarding medicines are outlined above.  Orders Placed This Encounter  Procedures   Lipid panel   CBC  Comprehensive metabolic panel   EKG 82-NFAO   Meds ordered this encounter  Medications    amLODipine (NORVASC) 5 MG tablet    Sig: Take 1 tablet (5 mg total) by mouth daily.    Dispense:  90 tablet    Refill:  3   losartan (COZAAR) 100 MG tablet    Sig: Take 1 tablet (100 mg total) by mouth daily. PATIENT MUST SCHEDULE APPOINTMENT FOR FUTURE REFIILLS.    Dispense:  90 tablet    Refill:  3   metoprolol succinate (TOPROL-XL) 50 MG 24 hr tablet    Sig: Take 1 tablet (50 mg total) by mouth daily. Take with or immediately following a meal.    Dispense:  90 tablet    Refill:  3   rosuvastatin (CRESTOR) 40 MG tablet    Sig: Take 1 tablet (40 mg total) by mouth daily. **Patient Needs OV for future refills**    Dispense:  90 tablet    Refill:  3   aspirin EC 81 MG tablet    Sig: Take 1 tablet (81 mg total) by mouth daily.    Dispense:  90 tablet    Refill:  0    Change to Rx    Patient Instructions  Medication Instructions:  Your physician recommends that you continue on your current medications as directed. Please refer to the Current Medication list given to you today.  *If you need a refill on your cardiac medications before your next appointment, please call your pharmacy*  Lab Work: Your physician recommends that you return for lab work TODAY:  CBC CMET FLP  If you have labs (blood work) drawn today and your tests are completely normal, you will receive your results only by: [place] (if you have MyChart) OR A paper copy in the mail If you have any lab test that is abnormal or we need to change your treatment, we will call you to review the results.  Testing/Procedures: NONE ordered at this time of appointment   Follow-Up: At [facility], you and your health needs are our priority.  As part of our continuing mission to provide you with exceptional heart care, we have created designated Provider Care Teams.  These Care Teams include your primary Cardiologist (physician) and Advanced Practice Providers (APPs -  Physician Assistants and Nurse Practitioners)  who all work together to provide you with the care you need, when you need it.  We recommend signing up for the patient portal called "MyChart".  Sign up information is provided on this After Visit Summary.  MyChart is used to connect with patients for Virtual Visits (Telemedicine).  Patients are able to view lab/test results, encounter notes, upcoming appointments, etc.  Non-urgent messages can be sent to your provider as well.   To learn more about what you can do with MyChart, go to [email or web address].    Your next appointment:   6 month(s)  The format for your next appointment:   In Person  Provider:   Shelva Majestic, MD  Other Instructions    Signed, Almyra Deforest, [place]  09/17/2021 9:35 PM    [place]

## 2021-09-17 ENCOUNTER — Encounter: Payer: Self-pay | Admitting: Physician Assistant

## 2021-09-20 NOTE — Progress Notes (Signed)
Normal red blood cell count. Normal renal function and electrolyte. Well controlled cholesterol.

## 2021-12-02 ENCOUNTER — Telehealth: Payer: Self-pay | Admitting: Cardiovascular Disease

## 2021-12-02 DIAGNOSIS — J3089 Other allergic rhinitis: Secondary | ICD-10-CM | POA: Diagnosis not present

## 2021-12-02 DIAGNOSIS — E663 Overweight: Secondary | ICD-10-CM | POA: Diagnosis not present

## 2021-12-02 DIAGNOSIS — M1991 Primary osteoarthritis, unspecified site: Secondary | ICD-10-CM | POA: Diagnosis not present

## 2021-12-02 DIAGNOSIS — I10 Essential (primary) hypertension: Secondary | ICD-10-CM | POA: Diagnosis not present

## 2021-12-02 NOTE — Telephone Encounter (Signed)
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 He said he needs is before July. No order on file yet

## 2021-12-02 NOTE — Telephone Encounter (Signed)
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

## 2021-12-21 NOTE — Telephone Encounter (Signed)
His last nuclear stress test was 12/24/20; he typically needs this to be done every 2 years; check with pt why needs additional study this year. If it is required ok to order same test.

## 2021-12-22 NOTE — Telephone Encounter (Signed)
-   Pt advise of MD's recommendations -Pt state stress test is only required every 2 yrs and didn't realize his last one was 12/22/20. -Pt state he should be ok to renew license.

## 2022-02-03 DIAGNOSIS — Z1331 Encounter for screening for depression: Secondary | ICD-10-CM | POA: Diagnosis not present

## 2022-02-03 DIAGNOSIS — R7309 Other abnormal glucose: Secondary | ICD-10-CM | POA: Diagnosis not present

## 2022-02-03 DIAGNOSIS — Z0001 Encounter for general adult medical examination with abnormal findings: Secondary | ICD-10-CM | POA: Diagnosis not present

## 2022-02-03 DIAGNOSIS — E663 Overweight: Secondary | ICD-10-CM | POA: Diagnosis not present

## 2022-02-03 DIAGNOSIS — Z6828 Body mass index (BMI) 28.0-28.9, adult: Secondary | ICD-10-CM | POA: Diagnosis not present

## 2022-02-03 DIAGNOSIS — J3089 Other allergic rhinitis: Secondary | ICD-10-CM | POA: Diagnosis not present

## 2022-02-03 DIAGNOSIS — I10 Essential (primary) hypertension: Secondary | ICD-10-CM | POA: Diagnosis not present

## 2022-04-24 ENCOUNTER — Telehealth: Payer: Self-pay | Admitting: Cardiovascular Disease

## 2022-04-24 NOTE — Telephone Encounter (Signed)
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 Patient wants to know if its time to have one.

## 2022-04-24 NOTE — Telephone Encounter (Signed)
Spoke with patient Last stress test was 12/2020 - should be due 2024 Mailed copy of last test, per request Scheduled for overdue 6 month visit on 06/16/22

## 2022-06-16 ENCOUNTER — Ambulatory Visit: Payer: Medicare Other | Admitting: Cardiovascular Disease

## 2022-06-16 ENCOUNTER — Encounter: Payer: Self-pay | Admitting: Cardiovascular Disease

## 2022-06-16 VITALS — BP 129/78 | HR 64 | Ht 68.0 in | Wt 174.6 lb

## 2022-06-16 DIAGNOSIS — I25810 Atherosclerosis of coronary artery bypass graft(s) without angina pectoris: Secondary | ICD-10-CM | POA: Diagnosis not present

## 2022-06-16 DIAGNOSIS — I447 Left bundle-branch block, unspecified: Secondary | ICD-10-CM

## 2022-06-16 DIAGNOSIS — I10 Essential (primary) hypertension: Secondary | ICD-10-CM

## 2022-06-16 DIAGNOSIS — E785 Hyperlipidemia, unspecified: Secondary | ICD-10-CM

## 2022-06-16 DIAGNOSIS — Z951 Presence of aortocoronary bypass graft: Secondary | ICD-10-CM
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
## 2022-06-16 NOTE — Patient Instructions (Signed)
Medication Instructions:  The current medical regimen is effective;  continue present plan and medications.  *If you need a refill on your cardiac medications before your next appointment, please call your pharmacy*   Testing/Procedures: Your physician has requested that you have a lexiscan myoview. For further information please visit [email or web address]. Please follow instruction sheet, as given.   Follow-Up: At [facility], you and your health needs are our priority.  As part of our continuing mission to provide you with exceptional heart care, we have created designated Provider Care Teams.  These Care Teams include your primary Cardiologist (physician) and Advanced Practice Providers (APPs -  Physician Assistants and Nurse Practitioners) who all work together to provide you with the care you need, when you need it.  We recommend signing up for the patient portal called "MyChart".  Sign up information is provided on this After Visit Summary.  MyChart is used to connect with patients for Virtual Visits (Telemedicine).  Patients are able to view lab/test results, encounter notes, upcoming appointments, etc.  Non-urgent messages can be sent to your provider as well.   To learn more about what you can do with MyChart, go to [email or web address].    Your next appointment:   10 month(s)  The format for your next appointment:   In Person  Provider:   Shelva Majestic, MD

## 2022-07-17 ENCOUNTER — Other Ambulatory Visit: Payer: Self-pay | Admitting: Physician Assistant

## 2022-08-05 ENCOUNTER — Other Ambulatory Visit: Payer: Self-pay | Admitting: Physician Assistant

## 2022-09-29 ENCOUNTER — Other Ambulatory Visit: Payer: Self-pay | Admitting: Physician Assistant

## 2022-12-15 DIAGNOSIS — M1991 Primary osteoarthritis, unspecified site: Secondary | ICD-10-CM | POA: Diagnosis not present

## 2022-12-15 DIAGNOSIS — K080 Exfoliation of teeth due to systemic causes: Secondary | ICD-10-CM | POA: Diagnosis not present

## 2022-12-15 DIAGNOSIS — M47812 Spondylosis without myelopathy or radiculopathy, cervical region: Secondary | ICD-10-CM | POA: Diagnosis not present

## 2022-12-15 DIAGNOSIS — E6609 Other obesity due to excess calories: Secondary | ICD-10-CM | POA: Diagnosis not present

## 2022-12-15 DIAGNOSIS — I10 Essential (primary) hypertension: Secondary | ICD-10-CM | POA: Diagnosis not present

## 2022-12-15 DIAGNOSIS — Z6828 Body mass index (BMI) 28.0-28.9, adult: Secondary | ICD-10-CM | POA: Diagnosis not present

## 2023-03-09 DIAGNOSIS — Z1331 Encounter for screening for depression: Secondary | ICD-10-CM | POA: Diagnosis not present

## 2023-03-09 DIAGNOSIS — I10 Essential (primary) hypertension: Secondary | ICD-10-CM | POA: Diagnosis not present

## 2023-03-09 DIAGNOSIS — M159 Polyosteoarthritis, unspecified: Secondary | ICD-10-CM | POA: Diagnosis not present

## 2023-03-09 DIAGNOSIS — M1991 Primary osteoarthritis, unspecified site: Secondary | ICD-10-CM | POA: Diagnosis not present

## 2023-03-09 DIAGNOSIS — M47812 Spondylosis without myelopathy or radiculopathy, cervical region: Secondary | ICD-10-CM | POA: Diagnosis not present

## 2023-03-09 DIAGNOSIS — R7309 Other abnormal glucose: Secondary | ICD-10-CM | POA: Diagnosis not present

## 2023-03-09 DIAGNOSIS — I2510 Atherosclerotic heart disease of native coronary artery without angina pectoris: Secondary | ICD-10-CM | POA: Diagnosis not present

## 2023-03-09 DIAGNOSIS — M47816 Spondylosis without myelopathy or radiculopathy, lumbar region: Secondary | ICD-10-CM | POA: Diagnosis not present

## 2023-03-09 DIAGNOSIS — Z0001 Encounter for general adult medical examination with abnormal findings: Secondary | ICD-10-CM | POA: Diagnosis not present

## 2023-03-09 DIAGNOSIS — E663 Overweight: Secondary | ICD-10-CM | POA: Diagnosis not present

## 2023-03-09 DIAGNOSIS — Z6828 Body mass index (BMI) 28.0-28.9, adult: Secondary | ICD-10-CM | POA: Diagnosis not present

## 2023-03-09 DIAGNOSIS — M5481 Occipital neuralgia: Secondary | ICD-10-CM | POA: Diagnosis not present

## 2023-03-29 ENCOUNTER — Telehealth (HOSPITAL_COMMUNITY): Payer: Self-pay | Admitting: *Deleted

## 2023-03-29 DIAGNOSIS — H26493 Other secondary cataract, bilateral: Secondary | ICD-10-CM | POA: Diagnosis not present

## 2023-03-29 NOTE — Telephone Encounter (Signed)
Spoke with patient and he was given instructions and directions for his STRESS TEST on 03/30/23 at 7:30.

## 2023-03-30 ENCOUNTER — Ambulatory Visit (HOSPITAL_COMMUNITY): Payer: Medicare Other | Attending: Cardiovascular Disease

## 2023-03-30 DIAGNOSIS — I25810 Atherosclerosis of coronary artery bypass graft(s) without angina pectoris: Secondary | ICD-10-CM | POA: Insufficient documentation

## 2023-03-30 LAB — MYOCARDIAL PERFUSION IMAGING
LV dias vol: 78 mL (ref 62–150)
LV sys vol: 33 mL
Nuc Stress EF: 57 %
Peak HR: 103 {beats}/min
Rest HR: 62 {beats}/min
Rest Nuclear Isotope Dose: 10.8 mCi
SDS: 1
SRS: 2
SSS: 3
Stress Nuclear Isotope Dose: 29.6 mCi
TID: 0.96

## 2023-03-30 MED ORDER — REGADENOSON 0.4 MG/5ML IV SOLN
0.4000 mg | Freq: Once | INTRAVENOUS | Status: AC
  Administered 2023-03-30: 0.4 mg via INTRAVENOUS

## 2023-03-30 MED ORDER — TECHNETIUM TC 99M TETROFOSMIN IV KIT
29.6000 | PACK | Freq: Once | INTRAVENOUS | Status: AC | PRN
  Administered 2023-03-30: 29.6 via INTRAVENOUS

## 2023-03-30 MED ORDER — TECHNETIUM TC 99M TETROFOSMIN IV KIT
10.8000 | PACK | Freq: Once | INTRAVENOUS | Status: AC | PRN
  Administered 2023-03-30: 10.8 via INTRAVENOUS

## 2023-04-04 ENCOUNTER — Telehealth: Payer: Self-pay | Admitting: Cardiovascular Disease

## 2023-04-04 NOTE — Telephone Encounter (Signed)
Patient is calling requesting a callback for his Lexiscan results.   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   Please advise.

## 2023-04-04 NOTE — Telephone Encounter (Signed)
Called patient, spoke with wife- advised that results were not sent from provider yet, I would reach out to him to get the results and we could give them a copy when completed.   Patient wife verbalized understanding

## 2023-04-09 ENCOUNTER — Telehealth: Payer: Self-pay | Admitting: Cardiovascular Disease

## 2023-04-09 NOTE — Telephone Encounter (Signed)
Returned call to patient and advised him that results are not reviewed by Dr. Tresa Endo but that as soon as they are we will call back with results. Patient verbalized understanding.

## 2023-04-09 NOTE — Telephone Encounter (Signed)
Patient called for results to his stress test.  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

## 2023-04-12 ENCOUNTER — Telehealth: Payer: Self-pay | Admitting: Cardiovascular Disease

## 2023-04-12 NOTE — Telephone Encounter (Signed)
Called and spoke with wife, advised that scans were completed, she states he needs a copy of his scans- and advised he could come tomorrow to pick them up. I advised I did not have any forms for pick up- however the results I could print and give to them. She verbalized understanding of this.

## 2023-04-12 NOTE — Telephone Encounter (Signed)
Patient called stated he is requesting clearance for DOT physical and his employer is requesting a copy of his echocardiogram. Pt stated his employer is requesting the documentation is completed by 05/02/23 if not, they will cancel his insurance. Will forward to MD and nurse.

## 2023-04-12 NOTE — Telephone Encounter (Signed)
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 Please advise

## 2023-04-20 NOTE — Telephone Encounter (Signed)
Patient came to pick up paperwork. Thanks!

## 2023-06-13 DIAGNOSIS — I10 Essential (primary) hypertension: Secondary | ICD-10-CM | POA: Diagnosis not present

## 2023-06-13 DIAGNOSIS — M159 Polyosteoarthritis, unspecified: Secondary | ICD-10-CM | POA: Diagnosis not present

## 2023-06-13 DIAGNOSIS — E663 Overweight: Secondary | ICD-10-CM | POA: Diagnosis not present

## 2023-06-13 DIAGNOSIS — L209 Atopic dermatitis, unspecified: Secondary | ICD-10-CM | POA: Diagnosis not present

## 2023-06-13 DIAGNOSIS — Z6827 Body mass index (BMI) 27.0-27.9, adult: Secondary | ICD-10-CM | POA: Diagnosis not present

## 2023-06-13 DIAGNOSIS — M1991 Primary osteoarthritis, unspecified site: Secondary | ICD-10-CM | POA: Diagnosis not present

## 2023-06-13 DIAGNOSIS — M47816 Spondylosis without myelopathy or radiculopathy, lumbar region: Secondary | ICD-10-CM | POA: Diagnosis not present

## 2023-06-15 DIAGNOSIS — K080 Exfoliation of teeth due to systemic causes: Secondary | ICD-10-CM | POA: Diagnosis not present

## 2023-06-19 DIAGNOSIS — K080 Exfoliation of teeth due to systemic causes: Secondary | ICD-10-CM | POA: Diagnosis not present

## 2023-06-29 DIAGNOSIS — Z6827 Body mass index (BMI) 27.0-27.9, adult: Secondary | ICD-10-CM | POA: Diagnosis not present

## 2023-06-29 DIAGNOSIS — I10 Essential (primary) hypertension: Secondary | ICD-10-CM | POA: Diagnosis not present

## 2023-06-29 DIAGNOSIS — M47816 Spondylosis without myelopathy or radiculopathy, lumbar region: Secondary | ICD-10-CM | POA: Diagnosis not present

## 2023-06-29 DIAGNOSIS — L209 Atopic dermatitis, unspecified: Secondary | ICD-10-CM | POA: Diagnosis not present

## 2023-06-29 DIAGNOSIS — M159 Polyosteoarthritis, unspecified: Secondary | ICD-10-CM | POA: Diagnosis not present

## 2023-06-29 DIAGNOSIS — M47812 Spondylosis without myelopathy or radiculopathy, cervical region: Secondary | ICD-10-CM | POA: Diagnosis not present

## 2023-06-29 DIAGNOSIS — M1991 Primary osteoarthritis, unspecified site: Secondary | ICD-10-CM | POA: Diagnosis not present

## 2023-07-11 ENCOUNTER — Other Ambulatory Visit: Payer: Self-pay | Admitting: Cardiovascular Disease

## 2023-07-29 ENCOUNTER — Other Ambulatory Visit: Payer: Self-pay | Admitting: Cardiovascular Disease

## 2023-09-13 DIAGNOSIS — K080 Exfoliation of teeth due to systemic causes: Secondary | ICD-10-CM | POA: Diagnosis not present

## 2023-09-26 ENCOUNTER — Other Ambulatory Visit: Payer: Self-pay | Admitting: Cardiovascular Disease

## 2023-10-05 ENCOUNTER — Other Ambulatory Visit: Payer: Self-pay | Admitting: Cardiovascular Disease

## 2023-10-18 ENCOUNTER — Other Ambulatory Visit: Payer: Self-pay | Admitting: Cardiovascular Disease

## 2023-11-09 DIAGNOSIS — Z6826 Body mass index (BMI) 26.0-26.9, adult: Secondary | ICD-10-CM | POA: Diagnosis not present

## 2023-11-09 DIAGNOSIS — M7522 Bicipital tendinitis, left shoulder: Secondary | ICD-10-CM | POA: Diagnosis not present

## 2023-11-09 DIAGNOSIS — M159 Polyosteoarthritis, unspecified: Secondary | ICD-10-CM | POA: Diagnosis not present

## 2023-11-09 DIAGNOSIS — M47816 Spondylosis without myelopathy or radiculopathy, lumbar region: Secondary | ICD-10-CM | POA: Diagnosis not present

## 2023-11-09 DIAGNOSIS — M47812 Spondylosis without myelopathy or radiculopathy, cervical region: Secondary | ICD-10-CM | POA: Diagnosis not present

## 2023-11-09 DIAGNOSIS — J209 Acute bronchitis, unspecified: Secondary | ICD-10-CM | POA: Diagnosis not present

## 2023-11-10 ENCOUNTER — Other Ambulatory Visit: Payer: Self-pay | Admitting: Cardiovascular Disease

## 2023-11-13 ENCOUNTER — Other Ambulatory Visit: Payer: Self-pay | Admitting: Cardiovascular Disease

## 2023-11-17 ENCOUNTER — Other Ambulatory Visit: Payer: Self-pay | Admitting: Cardiovascular Disease

## 2023-11-20 ENCOUNTER — Other Ambulatory Visit: Payer: Self-pay | Admitting: Cardiovascular Disease

## 2023-11-26 ENCOUNTER — Other Ambulatory Visit: Payer: Self-pay | Admitting: Cardiovascular Disease

## 2023-11-30 ENCOUNTER — Other Ambulatory Visit: Payer: Self-pay | Admitting: Cardiovascular Disease

## 2023-12-11 ENCOUNTER — Other Ambulatory Visit: Payer: Self-pay | Admitting: Cardiovascular Disease

## 2023-12-21 ENCOUNTER — Other Ambulatory Visit: Payer: Self-pay | Admitting: Cardiovascular Disease

## 2023-12-24 ENCOUNTER — Other Ambulatory Visit: Payer: Self-pay | Admitting: Cardiovascular Disease

## 2023-12-28 DIAGNOSIS — K080 Exfoliation of teeth due to systemic causes: Secondary | ICD-10-CM | POA: Diagnosis not present

## 2024-01-04 ENCOUNTER — Ambulatory Visit: Payer: Medicare Other | Admitting: Cardiology

## 2024-01-09 ENCOUNTER — Ambulatory Visit: Payer: Medicare Other | Admitting: Cardiology

## 2024-01-15 ENCOUNTER — Encounter: Payer: Self-pay | Admitting: Cardiovascular Disease

## 2024-01-15 ENCOUNTER — Ambulatory Visit: Payer: Medicare Other | Attending: Cardiovascular Disease | Admitting: Cardiovascular Disease

## 2024-01-15 VITALS — BP 135/75 | HR 54 | Ht 68.0 in | Wt 180.0 lb

## 2024-01-15 DIAGNOSIS — I2510 Atherosclerotic heart disease of native coronary artery without angina pectoris: Secondary | ICD-10-CM

## 2024-01-15 DIAGNOSIS — Z951 Presence of aortocoronary bypass graft: Secondary | ICD-10-CM

## 2024-01-15 DIAGNOSIS — E785 Hyperlipidemia, unspecified: Secondary | ICD-10-CM

## 2024-01-15 DIAGNOSIS — E782 Mixed hyperlipidemia: Secondary | ICD-10-CM

## 2024-01-15 DIAGNOSIS — I10 Essential (primary) hypertension: Secondary | ICD-10-CM | POA: Diagnosis not present

## 2024-01-15 DIAGNOSIS — I447 Left bundle-branch block, unspecified: Secondary | ICD-10-CM
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
## 2024-01-15 NOTE — Patient Instructions (Signed)
 Medication Instructions:  No procedures ordered today.  *If you need a refill on your cardiac medications before your next appointment, please call your pharmacy*   Lab Work: No labs were ordered during today's visit.  If you have labs (blood work) drawn today and your tests are completely normal, you will receive your results only by: MyChart Message (if you have MyChart) OR A paper copy in the mail If you have any lab test that is abnormal or we need to change your treatment, we will call you to review the results.   Testing/Procedures: No procedures ordered today.    Follow-Up: At [facility], you and your health needs are our priority.  As part of our continuing mission to provide you with exceptional heart care, we have created designated Provider Care Teams.  These Care Teams include your primary Cardiologist (physician) and Advanced Practice Providers (APPs -  Physician Assistants and Nurse Practitioners) who all work together to provide you with the care you need, when you need it.  We recommend signing up for the patient portal called "MyChart".  Sign up information is provided on this After Visit Summary.  MyChart is used to connect with patients for Virtual Visits (Telemedicine).  Patients are able to view lab/test results, encounter notes, upcoming appointments, etc.  Non-urgent messages can be sent to your provider as well.   To learn more about what you can do with MyChart, go to [email or web address].    Your next appointment:   1 year(s)  Provider:   Otho Darner, MD     Other Instructions        1st Floor: - Lobby - Registration  - Pharmacy  - Lab - Cafe   2nd Floor: - PV Lab - Diagnostic Testing (echo, CT, nuclear med)   3rd Floor: - Vacant   4th Floor: - TCTS (cardiothoracic surgery) - AFib Clinic - Structural Heart Clinic - Vascular Surgery  - Vascular Ultrasound   5th Floor: - [facility] (general and EP) - Clinical  Pharmacy for coumadin, hypertension, lipid, weight-loss medications, and med management appointments      Valet parking services will be available as well.

## 2024-01-16 ENCOUNTER — Other Ambulatory Visit: Payer: Self-pay | Admitting: Cardiovascular Disease

## 2024-01-17 ENCOUNTER — Encounter: Payer: Self-pay | Admitting: Cardiovascular Disease

## 2024-01-19 ENCOUNTER — Other Ambulatory Visit: Payer: Self-pay | Admitting: Cardiovascular Disease

## 2024-02-11 DIAGNOSIS — M25511 Pain in right shoulder: Secondary | ICD-10-CM | POA: Diagnosis not present

## 2024-02-11 DIAGNOSIS — E663 Overweight: Secondary | ICD-10-CM | POA: Diagnosis not present

## 2024-02-11 DIAGNOSIS — Z6826 Body mass index (BMI) 26.0-26.9, adult: Secondary | ICD-10-CM | POA: Diagnosis not present

## 2024-02-26 DIAGNOSIS — M47816 Spondylosis without myelopathy or radiculopathy, lumbar region: Secondary | ICD-10-CM | POA: Diagnosis not present

## 2024-02-26 DIAGNOSIS — M47812 Spondylosis without myelopathy or radiculopathy, cervical region: Secondary | ICD-10-CM | POA: Diagnosis not present

## 2024-02-26 DIAGNOSIS — E663 Overweight: Secondary | ICD-10-CM | POA: Diagnosis not present

## 2024-02-26 DIAGNOSIS — I10 Essential (primary) hypertension: Secondary | ICD-10-CM | POA: Diagnosis not present

## 2024-02-26 DIAGNOSIS — Z6827 Body mass index (BMI) 27.0-27.9, adult: Secondary | ICD-10-CM | POA: Diagnosis not present

## 2024-02-26 DIAGNOSIS — M5481 Occipital neuralgia: Secondary | ICD-10-CM | POA: Diagnosis not present

## 2024-03-11 ENCOUNTER — Encounter: Payer: Self-pay | Admitting: *Deleted

## 2024-03-26 DIAGNOSIS — K080 Exfoliation of teeth due to systemic causes: Secondary | ICD-10-CM | POA: Diagnosis not present

## 2024-04-17 ENCOUNTER — Other Ambulatory Visit: Payer: Self-pay | Admitting: Cardiovascular Disease

## 2024-06-02 DIAGNOSIS — Z6826 Body mass index (BMI) 26.0-26.9, adult: Secondary | ICD-10-CM | POA: Diagnosis not present

## 2024-06-02 DIAGNOSIS — E663 Overweight: Secondary | ICD-10-CM | POA: Diagnosis not present

## 2024-06-02 DIAGNOSIS — B353 Tinea pedis: Secondary | ICD-10-CM | POA: Diagnosis not present

## 2024-10-21 ENCOUNTER — Ambulatory Visit
Admission: EM | Admit: 2024-10-21 | Discharge: 2024-10-21 | Disposition: A | Discharge status: Home / Self Care | Attending: Nurse Practitioner | Admitting: Nurse Practitioner

## 2024-10-21 DIAGNOSIS — H6991 Unspecified Eustachian tube disorder, right ear: Secondary | ICD-10-CM

## 2024-10-21 DIAGNOSIS — J029 Acute pharyngitis, unspecified: Secondary | ICD-10-CM | POA: Diagnosis not present

## 2024-10-21 MED ORDER — FLUTICASONE PROPIONATE 50 MCG/ACT NA SUSP: 2.0000 | Freq: Every day | NASAL | 0 refills | Status: AC

## 2024-10-21 MED ORDER — CETIRIZINE HCL 10 MG PO TABS: 10.0000 mg | ORAL_TABLET | Freq: Every day | ORAL | 0 refills | Status: AC

## 2024-10-21 NOTE — ED Triage Notes (Signed)
 Pt reports some sore throat and right ear pain x 1 day

## 2024-10-21 NOTE — Discharge Instructions (Signed)
 Take medication as prescribed. You may take over-the-counter Tylenol  as needed for pain, fever, or general discomfort. Apply warm compresses to the right ear to help with pain or discomfort. Avoid sudden movement to prevent falls. Continue to monitor your symptoms for worsening.  If symptoms fail to improve, or begin to worsen, you may follow-up in this clinic or with your primary care physician for further evaluation. Follow-up as needed.

## 2024-10-21 NOTE — ED Provider Notes (Signed)
 [facility]    CSN: [id number] Arrival date & time: 10/21/24  1022      History   Chief Complaint No chief complaint on file.   HPI ISIAHA Lawrence is a 78 y.o. male.   The history is provided by the patient.   Patient presents with a 1 day history of sore throat and right ear pain.  Patient states he felt dizzy on yesterday.  He states that his throat pain has since improved.  He does report that he feels some tenderness on the right side of his neck immediately below the right ear.  He denies fever, chills, ear drainage, decreased hearing, nasal congestion, runny nose, or cough.  States that he did take Coricidin for his symptoms.  Past Medical History:  Diagnosis Date   Arthritis    HANDS HURT.  HX OF NERVE COMPRESSION IN LOWER BACK THAT CAUSES PAIN -AND LEFT LEG PAIN   Back pain    PT STATES HE HAS FLARE UPS OF LOWER BACK AND LEFT LEG PAIN -MAYBE A LITTLE NUMBNESS--PT STATES HE WAS TOLD HE HAS A PINCHED NERVE.   CAD (coronary artery disease)    DR. DEBBY SOR -CARDIOLOGIST; CABG 2001,HEART CATH 2005 -PATENT GRAFTS, LUMINAL IRREGULARITY IN THE VEIN TO THE DIAGONAL VESSEL.  HIS RCA WHICH WAS NONDOMINANT, HAD 78% STENOSIS.  NUCLEAR STRESS TEST 04/04/12--ESSENTIALLY UNCHANGED FROM PRIOR STUDY AND SHOWED A SMALL PERFUSION DEFECT IN THE BASAL ANTERIOR AND BASAL ANTEROSEPTAL REGION CONSISTENT WITH PROXIMAL LAD SCAR.    Complication of anesthesia    PT STATES SURGERY AT [facility] 2012  SPINAL ATTEMPTED FOR HEMORRHOIDECTOMY AND HERNIA REPAIR.  PT STATES WHEN NEEDLE WAS PUT IN HIS BACK - HE FELT ELECTRICAL SHOCK DOWN LEFT GROIN AND LEFT LEG.  PT STATES THAT HE WAS THEN PUT TO  SLEEP.   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   Weakness of left arm    slight left arm weakness      Patient Active Problem List   Diagnosis Date Noted   Status post total knee replacement, left 10/24/2019   Abdominal pain 07/08/2018   Incarcerated incisional hernia 07/08/2018   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 Shaaron- grade 3 hemorrhoids, o/w normal ileocolonoscopy   CORONARY ARTERY BYPASS GRAFT  01/26/2000   ELBOW SURGERY     for a chipped bone   EYE SURGERY Bilateral    Cataracts   HEMORRHOID SURGERY  07/26/2012   Dr. Ingram   HEMORRHOID SURGERY N/A 06/18/2015   Procedure: INTERNAL AND EXTERNAL HEMORRHOIDECTOMY SINGLE CLOUMN;  Surgeon: Elon Pacini, MD;  Location: [facility];  Service: General;  Laterality: N/A;   HEMORROIDECTOMY     MANY YEARS AGO   HERNIA REPAIR Right 06/11/2018    LAPAROSCOPIC ASSISTED RIGHT SPIGELIAN HERNIA REPAIR (Right Abdomen)   INSERTION OF MESH Right 06/11/2018   Procedure: INSERTION OF MESH;  Surgeon: Pacini Elon, MD;  Location: [facility];  Service: General;  Laterality: Right;  GENERAL AND TAP BLOCK   LAPAROSCOPIC ASSISTED SPIGELIAN HERNIA REPAIR Right 06/11/2018   Procedure: LAPAROSCOPIC ASSISTED RIGHT SPIGELIAN HERNIA REPAIR;  Surgeon: Pacini Elon, MD;  Location: [facility] OR;  Service: General;  Laterality: Right;   GENERAL AND TAP BLOCK   LAPAROSCOPIC ASSISTED SPIGELIAN HERNIA REPAIR N/A 07/08/2018   Procedure: LAPAROSCOPIC ASSISTED RECURRENT INCARCERATED  SPIGELIAN HERNIA  REPAIR WITH MESH;  Surgeon: Pacini Elon, MD;  Location: [facility];  Service: General;  Laterality: N/A;   LAPAROSCOPIC INTERNAL HERNIA REPAIR  07/08/2018   Procedure: LAPAROSCOPIC INTERNAL HERNIA WITH OPEN REPAIR RECURRENT INCARCERATED SPIGELIAN HERNIA WITH MESH;  Surgeon: Pacini Elon, MD;  Location: [facility];  Service: General;;   Rt hernia repair in March of 2012     STATES HE ALSO HAD HEMORRHODECTOMY AT THE SAME TIME   STERNAL WIRES REMOVAL N/A 03/13/2017   Procedure: STERNAL WIRES REMOVAL;  Surgeon: Sudie VEAR Laine, MD;  Location: [facility] OR;  Service: Thoracic;  Laterality: N/A;   TOTAL KNEE ARTHROPLASTY Left 10/24/2019   Procedure: TOTAL KNEE ARTHROPLASTY;  Surgeon: Kay Kemps, MD;  Location: [facility];  Service: Orthopedics;  Laterality: Left;       Home Medications    Prior to Admission medications   Medication Sig Start Date End Date Taking? Authorizing Provider  acetaminophen  (TYLENOL ) 650 MG CR tablet Take 1,300 mg by mouth every 8 (eight) hours as needed for pain.    [provider]  amLODipine  (NORVASC ) 5 MG tablet Take 1 tablet (5 mg total) by mouth daily. 01/21/24   Burnard Debby LABOR, MD  aspirin  EC 81 MG tablet Take 1 tablet (81 mg total) by mouth daily. 09/15/21   Meng, Hao, PA  hydrochlorothiazide  (MICROZIDE ) 12.5 MG capsule TAKE 1 CAPSULE BY MOUTH AS NEEDED (FOR LEG SWELLING). Patient not taking: Reported on 01/15/2024 12/03/20   Madie Jon Garre, PA  losartan  (COZAAR ) 100 MG tablet Take 1 tablet (100 mg total) by mouth daily. 01/21/24   Burnard Debby LABOR, MD  metoprolol  succinate (TOPROL -XL) 50 MG 24 hr tablet TAKE 1 TABLET BY MOUTH EVERY DAY WITH OR IMMEDIATELY FOLLOWING A MEAL 01/16/24   Burnard Debby LABOR, MD  polyethylene glycol (MIRALAX  / GLYCOLAX ) 17 g packet Take 17 g by mouth daily.    [provider]   rosuvastatin  (CRESTOR ) 40 MG tablet TAKE 1 TAB BY MOUTH DAILY. OFFICE VISIT NEEDED FOR FUTURE REFILLS. FIRST ATTEMPT. 04/17/24   Burnard Debby LABOR, MD    Family History Family History  Problem Relation Age of Onset   Heart disease Father    Heart failure Father    Heart failure Mother    Heart attack Mother    Diabetes Mother    Heart disease Paternal Grandfather    Heart attack Maternal Grandmother    Colon cancer Neg Hx     Social History Social History   Tobacco Use   Smoking status: Former    Current packs/day: 2.00    Average packs/day:  2.0 packs/day for 10.0 years (20.0 ttl pk-yrs)    Types: Cigarettes   Smokeless tobacco: Never   Tobacco comments:    QUIT 1991  Vaping Use   Vaping status: Never Used  Substance Use Topics   Alcohol use: No   Drug use: No     Allergies   Patient has no known allergies.   Review of Systems Review of Systems Per HPI  Physical Exam Triage Vital Signs ED Triage Vitals  Encounter Vitals Group     BP 10/21/24 1110 124/72     Girls Systolic BP Percentile --      Girls Diastolic BP Percentile --      Boys Systolic BP Percentile --      Boys Diastolic BP Percentile --      Pulse Rate 10/21/24 1110 62     Resp 10/21/24 1110 20     Temp 10/21/24 1110 98.6 F (37 C)     Temp Source 10/21/24 1110 Oral     SpO2 10/21/24 1110 97 %     Weight --      Height --      Head Circumference --      Peak Flow --      Pain Score 10/21/24 1111 0     Pain Loc --      Pain Education --      Exclude from Growth Chart --    No data found.  Updated Vital Signs BP 124/72 (BP Location: Right Arm)   Pulse 62   Temp 98.6 F (37 C) (Oral)   Resp 20   SpO2 97%   Visual Acuity Right Eye Distance:   Left Eye Distance:   Bilateral Distance:    Right Eye Near:   Left Eye Near:    Bilateral Near:     Physical Exam Vitals and nursing note reviewed.  Constitutional:      General: He is not in acute distress.    Appearance: Normal  appearance.  HENT:     Head: Normocephalic.     Right Ear: Hearing, ear canal and external ear normal. A middle ear effusion is present.     Left Ear: Hearing, tympanic membrane, ear canal and external ear normal.  Eyes:     Extraocular Movements: Extraocular movements intact.     Pupils: Pupils are equal, round, and reactive to light.  Cardiovascular:     Rate and Rhythm: Normal rate and regular rhythm.     Pulses: Normal pulses.     Heart sounds: Normal heart sounds.  Pulmonary:     Effort: Pulmonary effort is normal.     Breath sounds: Normal breath sounds.  Musculoskeletal:     Cervical back: Normal range of motion.  Lymphadenopathy:     Cervical: No cervical adenopathy.  Skin:    General: Skin is warm and dry.  Neurological:     General: No focal deficit present.     Mental Status: He is alert and oriented to person, place, and time.  Psychiatric:        Mood and Affect: Mood normal.        Behavior: Behavior normal.      UC Treatments / Results  Labs (all labs ordered are listed, but only abnormal results are displayed) Labs Reviewed - No data to display  EKG   Radiology No results found.  Procedures Procedures (including critical care time)  Medications Ordered in UC Medications - No data to display  Initial  Impression / Assessment and Plan / UC Course  I have reviewed the triage vital signs and the nursing notes.  Pertinent labs & imaging results that were available during my care of the patient were reviewed by me and considered in my medical decision making (see chart for details).  On exam, the patient's lung sounds are clear throughout, room air sats are at 97%.  He does have a right middle ear effusion on exam.  He denies throat pain at this time, will forego viral testing or testing for strep.  Will treat for right eustachian tube dysfunction with cetirizine  10 mg and fluticasone  50 mcg nasal spray.  Supportive care recommendations were provided and  discussed with the patient to include over-the-counter Tylenol , warm salt water  gargles, warm compresses to the affected area, and to monitor for worsening.  Discussed indications with the patient regarding follow-up.  Patient was in agreement with this plan of care and verbalizes understanding.  All questions were answered.  Patient stable for discharge.  Work note was provided.   Final Clinical Impressions(s) / UC Diagnoses   Final diagnoses:  Sore throat   Discharge Instructions   None    ED Prescriptions   None    PDMP not reviewed this encounter.   Gilmer Etta PARAS, NP 10/21/24 1210

## 2024-11-25 ENCOUNTER — Telehealth: Payer: Self-pay | Admitting: Cardiology

## 2024-11-25 NOTE — Telephone Encounter (Signed)
 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 Per Dr Thalia last office visit with him there is no guidance on this. Informed patient I will forward to Hao who he is following up with for his next appt and reach back out. Pt agreeable, states that his DOT expires 2/28 and would need it before then.

## 2024-11-25 NOTE — Telephone Encounter (Signed)
 Pt needs to get medical card. Pt wants to know if he needs a stress test. Please advise. Can leave VM.

## 2024-11-25 NOTE — Telephone Encounter (Signed)
 Ok to order lexiscan  to be done prior to the next visit

## 2024-11-25 NOTE — Telephone Encounter (Addendum)
 Informed patient that test was ordered. Scheduled test over the phone and went over instructions. Pt does not have a mychart. Verbalizes understanding of instructions. Instructed to call back with any questions.

## 2024-11-25 NOTE — Addendum Note (Signed)
 Addended by: JOSHUA ANDREZ PARAS on: 11/25/2024 02:58 PM   Modules accepted: Orders

## 2024-11-25 NOTE — Telephone Encounter (Signed)
 Returned pts call. Pt was not currently at home and will call back.

## 2024-11-26 ENCOUNTER — Telehealth: Payer: Self-pay | Admitting: Physician Assistant

## 2024-11-26 ENCOUNTER — Other Ambulatory Visit: Payer: Self-pay | Admitting: Physician Assistant

## 2024-11-26 ENCOUNTER — Telehealth (HOSPITAL_COMMUNITY): Payer: Self-pay | Admitting: *Deleted

## 2024-11-26 DIAGNOSIS — I25709 Atherosclerosis of coronary artery bypass graft(s), unspecified, with unspecified angina pectoris: Secondary | ICD-10-CM

## 2024-11-26 NOTE — Telephone Encounter (Signed)
 Spoke to patient's wife as a reminder about his scheduled STRESS TEST on 11/28/24 at 7:30.

## 2024-11-26 NOTE — Telephone Encounter (Signed)
 I spoke with the patient's wife, he is due for stress test every 2 years for his [employer] license.  Risk and benefit of the stress test has been discussed.  There is a small possibility of chest pain, respiratory failure, flushing, nausea vomiting, or headache.  Stress test scheduled for this Friday.

## 2024-11-28 ENCOUNTER — Ambulatory Visit (HOSPITAL_COMMUNITY)
Admission: RE | Admit: 2024-11-28 | Discharge: 2024-11-28 | Disposition: A | Source: Ambulatory Visit | Attending: Cardiology | Admitting: Cardiology

## 2024-11-28 ENCOUNTER — Ambulatory Visit: Payer: Self-pay | Admitting: Physician Assistant

## 2024-11-28 DIAGNOSIS — I25709 Atherosclerosis of coronary artery bypass graft(s), unspecified, with unspecified angina pectoris: Secondary | ICD-10-CM | POA: Insufficient documentation

## 2024-11-28 LAB — MYOCARDIAL PERFUSION IMAGING
LV dias vol: 117 mL (ref 62–150)
LV sys vol: 43 mL
Nuc Stress EF: 63 %
Peak HR: 69 {beats}/min
Rest HR: 57 {beats}/min
Rest Nuclear Isotope Dose: 10.6 mCi
SDS: 0
SRS: 4
SSS: 0
ST Depression (mm): 0 mm
Stress Nuclear Isotope Dose: 31.8 mCi
TID: 1.05

## 2024-11-28 MED ORDER — REGADENOSON 0.4 MG/5ML IV SOLN
INTRAVENOUS | Status: AC
  Filled 2024-11-28: qty 5

## 2024-11-28 MED ORDER — REGADENOSON 0.4 MG/5ML IV SOLN
0.4000 mg | Freq: Once | INTRAVENOUS | Status: AC
  Administered 2024-11-28: 0.4 mg via INTRAVENOUS

## 2024-11-28 MED ORDER — TECHNETIUM TC 99M TETROFOSMIN IV KIT
10.6000 | PACK | Freq: Once | INTRAVENOUS | Status: AC | PRN
  Administered 2024-11-28: 10.6 via INTRAVENOUS

## 2024-11-28 MED ORDER — TECHNETIUM TC 99M TETROFOSMIN IV KIT
31.8000 | PACK | Freq: Once | INTRAVENOUS | Status: AC | PRN
  Administered 2024-11-28: 31.8 via INTRAVENOUS

## 2024-12-03 ENCOUNTER — Ambulatory Visit: Payer: Self-pay | Admitting: Physician Assistant

## 2025-01-14 ENCOUNTER — Ambulatory Visit: Admitting: Physician Assistant
